# Patient Record
Sex: Female | Born: 1985 | Race: Black or African American | Hispanic: No | Marital: Single | Smoking: Current every day smoker
Health system: Southern US, Community
[De-identification: ages and names within clinical notes are randomized; demographics above are authoritative.]

## PROBLEM LIST (undated history)

## (undated) DIAGNOSIS — G8929 Other chronic pain: Secondary | ICD-10-CM

## (undated) DIAGNOSIS — R519 Headache, unspecified: Secondary | ICD-10-CM

## (undated) DIAGNOSIS — R112 Nausea with vomiting, unspecified: Secondary | ICD-10-CM

## (undated) DIAGNOSIS — I10 Essential (primary) hypertension: Secondary | ICD-10-CM

## (undated) DIAGNOSIS — R109 Unspecified abdominal pain: Secondary | ICD-10-CM

## (undated) DIAGNOSIS — R51 Headache: Secondary | ICD-10-CM

## (undated) HISTORY — PX: OTHER SURGICAL HISTORY: SHX169

---
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
## 2004-11-23 ENCOUNTER — Ambulatory Visit: Payer: Self-pay | Admitting: Family Medicine

## 2005-02-27 ENCOUNTER — Ambulatory Visit: Payer: Self-pay | Admitting: Family Medicine

## 2005-06-01 ENCOUNTER — Emergency Department (HOSPITAL_COMMUNITY): Admission: EM | Admit: 2005-06-01 | Discharge: 2005-06-02 | Payer: Self-pay | Admitting: *Deleted

## 2005-07-09 ENCOUNTER — Ambulatory Visit: Payer: Self-pay | Admitting: Family Medicine

## 2005-09-05 ENCOUNTER — Emergency Department (HOSPITAL_COMMUNITY): Admission: EM | Admit: 2005-09-05 | Discharge: 2005-09-05 | Payer: Self-pay | Admitting: Emergency Medicine

## 2006-01-04 ENCOUNTER — Ambulatory Visit: Payer: Self-pay | Admitting: Family Medicine

## 2006-02-21 ENCOUNTER — Ambulatory Visit: Payer: Self-pay | Admitting: Family Medicine

## 2006-04-07 ENCOUNTER — Emergency Department (HOSPITAL_COMMUNITY): Admission: EM | Admit: 2006-04-07 | Discharge: 2006-04-07 | Payer: Self-pay | Admitting: Emergency Medicine

## 2006-04-08 ENCOUNTER — Ambulatory Visit: Payer: Self-pay | Admitting: Family Medicine

## 2006-04-08 ENCOUNTER — Observation Stay (HOSPITAL_COMMUNITY): Admission: AD | Admit: 2006-04-08 | Discharge: 2006-04-09 | Payer: Self-pay | Admitting: Obstetrics and Gynecology

## 2006-07-10 ENCOUNTER — Ambulatory Visit: Payer: Self-pay | Admitting: Family Medicine

## 2006-07-10 ENCOUNTER — Encounter: Payer: Self-pay | Admitting: Family Medicine

## 2006-07-10 ENCOUNTER — Other Ambulatory Visit: Admission: RE | Admit: 2006-07-10 | Discharge: 2006-07-10 | Payer: Self-pay | Admitting: Family Medicine

## 2007-04-21 ENCOUNTER — Emergency Department (HOSPITAL_COMMUNITY): Admission: EM | Admit: 2007-04-21 | Discharge: 2007-04-22 | Payer: Self-pay | Admitting: Emergency Medicine

## 2007-05-28 ENCOUNTER — Emergency Department (HOSPITAL_COMMUNITY): Admission: EM | Admit: 2007-05-28 | Discharge: 2007-05-28 | Payer: Self-pay | Admitting: Emergency Medicine

## 2007-09-14 ENCOUNTER — Observation Stay (HOSPITAL_COMMUNITY): Admission: EM | Admit: 2007-09-14 | Discharge: 2007-09-15 | Payer: Self-pay | Admitting: Emergency Medicine

## 2008-02-11 ENCOUNTER — Emergency Department (HOSPITAL_COMMUNITY): Admission: EM | Admit: 2008-02-11 | Discharge: 2008-02-11 | Payer: Self-pay | Admitting: Emergency Medicine

## 2008-02-29 ENCOUNTER — Emergency Department (HOSPITAL_COMMUNITY): Admission: EM | Admit: 2008-02-29 | Discharge: 2008-02-29 | Payer: Self-pay | Admitting: Emergency Medicine

## 2008-05-05 ENCOUNTER — Other Ambulatory Visit: Admission: RE | Admit: 2008-05-05 | Discharge: 2008-05-05 | Payer: Self-pay | Admitting: Family Medicine

## 2008-05-05 ENCOUNTER — Encounter: Payer: Self-pay | Admitting: Family Medicine

## 2008-05-05 ENCOUNTER — Ambulatory Visit: Payer: Self-pay | Admitting: Family Medicine

## 2008-05-05 LAB — CONVERTED CEMR LAB: Pap Smear: NORMAL

## 2008-05-06 ENCOUNTER — Encounter: Payer: Self-pay | Admitting: Family Medicine

## 2008-05-06 LAB — CONVERTED CEMR LAB
Chlamydia, DNA Probe: NEGATIVE
Gardnerella vaginalis: NEGATIVE

## 2008-06-28 ENCOUNTER — Emergency Department (HOSPITAL_COMMUNITY): Admission: EM | Admit: 2008-06-28 | Discharge: 2008-06-28 | Payer: Self-pay | Admitting: Emergency Medicine

## 2008-07-07 ENCOUNTER — Ambulatory Visit: Payer: Self-pay | Admitting: Family Medicine

## 2008-07-13 ENCOUNTER — Encounter: Payer: Self-pay | Admitting: Family Medicine

## 2008-07-13 DIAGNOSIS — F17200 Nicotine dependence, unspecified, uncomplicated: Secondary | ICD-10-CM

## 2008-07-13 DIAGNOSIS — Z720 Tobacco use: Secondary | ICD-10-CM | POA: Insufficient documentation

## 2008-07-13 DIAGNOSIS — G43909 Migraine, unspecified, not intractable, without status migrainosus: Secondary | ICD-10-CM

## 2008-07-13 HISTORY — DX: Migraine, unspecified, not intractable, without status migrainosus: G43.909

## 2008-10-13 ENCOUNTER — Ambulatory Visit: Payer: Self-pay | Admitting: Family Medicine

## 2008-10-13 DIAGNOSIS — R5381 Other malaise: Secondary | ICD-10-CM | POA: Insufficient documentation

## 2008-10-13 DIAGNOSIS — R5383 Other fatigue: Secondary | ICD-10-CM

## 2008-11-20 ENCOUNTER — Emergency Department (HOSPITAL_COMMUNITY): Admission: EM | Admit: 2008-11-20 | Discharge: 2008-11-20 | Payer: Self-pay | Admitting: Emergency Medicine

## 2009-02-14 ENCOUNTER — Emergency Department (HOSPITAL_COMMUNITY): Admission: EM | Admit: 2009-02-14 | Discharge: 2009-02-14 | Payer: Self-pay | Admitting: Emergency Medicine

## 2009-02-16 ENCOUNTER — Ambulatory Visit: Payer: Self-pay | Admitting: Family Medicine

## 2009-02-28 ENCOUNTER — Telehealth: Payer: Self-pay | Admitting: Family Medicine

## 2009-04-11 ENCOUNTER — Ambulatory Visit: Payer: Self-pay | Admitting: Family Medicine

## 2009-04-17 ENCOUNTER — Emergency Department (HOSPITAL_COMMUNITY): Admission: EM | Admit: 2009-04-17 | Discharge: 2009-04-17 | Payer: Self-pay | Admitting: Emergency Medicine

## 2009-08-24 ENCOUNTER — Emergency Department (HOSPITAL_COMMUNITY): Admission: EM | Admit: 2009-08-24 | Discharge: 2009-08-24 | Payer: Self-pay | Admitting: Emergency Medicine

## 2009-11-08 ENCOUNTER — Telehealth: Payer: Self-pay | Admitting: Family Medicine

## 2009-11-14 ENCOUNTER — Ambulatory Visit: Payer: Self-pay | Admitting: Family Medicine

## 2009-11-14 LAB — CONVERTED CEMR LAB
BUN: 8 mg/dL (ref 6–23)
Basophils Absolute: 0 10*3/uL (ref 0.0–0.1)
Beta hcg, urine, semiquantitative: NEGATIVE
CO2: 23 meq/L (ref 19–32)
Calcium: 9.5 mg/dL (ref 8.4–10.5)
Chloride: 106 meq/L (ref 96–112)
Creatinine, Ser: 0.68 mg/dL (ref 0.40–1.20)
Eosinophils Relative: 2 % (ref 0–5)
Glucose, Bld: 81 mg/dL (ref 70–99)
HCT: 39.8 % (ref 36.0–46.0)
Hemoglobin: 13.9 g/dL (ref 12.0–15.0)
Lymphocytes Relative: 54 % — ABNORMAL HIGH (ref 12–46)
Lymphs Abs: 2.4 10*3/uL (ref 0.7–4.0)
Monocytes Absolute: 0.4 10*3/uL (ref 0.1–1.0)
Monocytes Relative: 9 % (ref 3–12)
Neutrophils Relative %: 35 % — ABNORMAL LOW (ref 43–77)
Potassium: 4.3 meq/L (ref 3.5–5.3)
RBC: 4.15 M/uL (ref 3.87–5.11)
RDW: 13.5 % (ref 11.5–15.5)
Sodium: 140 meq/L (ref 135–145)
TSH: 1.558 microintl units/mL (ref 0.350–4.500)
VLDL: 19 mg/dL (ref 0–40)
WBC: 4.5 10*3/uL (ref 4.0–10.5)

## 2009-12-30 ENCOUNTER — Ambulatory Visit: Payer: Self-pay | Admitting: Family Medicine

## 2009-12-30 ENCOUNTER — Other Ambulatory Visit: Admission: RE | Admit: 2009-12-30 | Discharge: 2009-12-30 | Payer: Self-pay | Admitting: Family Medicine

## 2009-12-31 ENCOUNTER — Encounter: Payer: Self-pay | Admitting: Family Medicine

## 2009-12-31 DIAGNOSIS — N760 Acute vaginitis: Secondary | ICD-10-CM | POA: Insufficient documentation

## 2010-01-02 ENCOUNTER — Encounter: Payer: Self-pay | Admitting: Family Medicine

## 2010-01-05 LAB — CONVERTED CEMR LAB: Gardnerella vaginalis: POSITIVE — AB

## 2010-02-14 ENCOUNTER — Emergency Department (HOSPITAL_COMMUNITY): Admission: EM | Admit: 2010-02-14 | Discharge: 2010-02-14 | Payer: Self-pay | Admitting: Emergency Medicine

## 2010-03-15 ENCOUNTER — Encounter (INDEPENDENT_AMBULATORY_CARE_PROVIDER_SITE_OTHER): Payer: Self-pay

## 2010-03-15 ENCOUNTER — Ambulatory Visit: Payer: Self-pay | Admitting: Internal Medicine

## 2010-03-19 DIAGNOSIS — R1013 Epigastric pain: Secondary | ICD-10-CM

## 2010-03-19 DIAGNOSIS — R112 Nausea with vomiting, unspecified: Secondary | ICD-10-CM

## 2010-03-23 ENCOUNTER — Encounter: Payer: Self-pay | Admitting: Internal Medicine

## 2010-03-28 ENCOUNTER — Ambulatory Visit: Payer: Self-pay | Admitting: Internal Medicine

## 2010-03-28 ENCOUNTER — Ambulatory Visit (HOSPITAL_COMMUNITY): Admission: RE | Admit: 2010-03-28 | Discharge: 2010-03-28 | Payer: Self-pay | Admitting: Internal Medicine

## 2010-03-29 ENCOUNTER — Encounter: Payer: Self-pay | Admitting: Internal Medicine

## 2010-04-07 ENCOUNTER — Emergency Department (HOSPITAL_COMMUNITY): Admission: EM | Admit: 2010-04-07 | Discharge: 2010-04-07 | Payer: Self-pay | Admitting: Emergency Medicine

## 2010-05-21 ENCOUNTER — Emergency Department (HOSPITAL_COMMUNITY): Admission: EM | Admit: 2010-05-21 | Discharge: 2010-05-21 | Payer: Self-pay | Admitting: Emergency Medicine

## 2010-05-30 ENCOUNTER — Ambulatory Visit: Payer: Self-pay | Admitting: Family Medicine

## 2010-05-30 DIAGNOSIS — N3000 Acute cystitis without hematuria: Secondary | ICD-10-CM | POA: Insufficient documentation

## 2010-05-30 DIAGNOSIS — R634 Abnormal weight loss: Secondary | ICD-10-CM

## 2010-05-31 ENCOUNTER — Encounter: Payer: Self-pay | Admitting: Family Medicine

## 2010-06-01 LAB — CONVERTED CEMR LAB
Basophils Absolute: 0 10*3/uL (ref 0.0–0.1)
Lymphocytes Relative: 51 % — ABNORMAL HIGH (ref 12–46)
MCV: 98.1 fL (ref 78.0–100.0)
Neutrophils Relative %: 40 % — ABNORMAL LOW (ref 43–77)
RBC: 4.11 M/uL (ref 3.87–5.11)
RDW: 14 % (ref 11.5–15.5)
Vit D, 25-Hydroxy: 20 ng/mL — ABNORMAL LOW (ref 30–89)
WBC: 4.3 10*3/uL (ref 4.0–10.5)

## 2010-10-13 ENCOUNTER — Ambulatory Visit: Payer: Self-pay | Admitting: Family Medicine

## 2010-10-13 DIAGNOSIS — N912 Amenorrhea, unspecified: Secondary | ICD-10-CM

## 2010-10-13 LAB — CONVERTED CEMR LAB: Beta hcg, urine, semiquantitative: POSITIVE

## 2010-10-17 ENCOUNTER — Encounter: Payer: Self-pay | Admitting: Family Medicine

## 2010-10-18 ENCOUNTER — Emergency Department (HOSPITAL_COMMUNITY): Admission: EM | Admit: 2010-10-18 | Discharge: 2010-10-18 | Payer: Self-pay | Admitting: Emergency Medicine

## 2010-10-19 ENCOUNTER — Emergency Department (HOSPITAL_COMMUNITY): Admission: EM | Admit: 2010-10-19 | Discharge: 2010-10-19 | Payer: Self-pay | Admitting: Emergency Medicine

## 2010-10-23 ENCOUNTER — Telehealth (INDEPENDENT_AMBULATORY_CARE_PROVIDER_SITE_OTHER): Payer: Self-pay | Admitting: *Deleted

## 2010-11-01 ENCOUNTER — Emergency Department (HOSPITAL_COMMUNITY): Admission: EM | Admit: 2010-11-01 | Discharge: 2010-11-02 | Payer: Self-pay | Admitting: Emergency Medicine

## 2010-11-03 ENCOUNTER — Other Ambulatory Visit: Admission: RE | Admit: 2010-11-03 | Discharge: 2010-11-03 | Payer: Self-pay | Admitting: Obstetrics & Gynecology

## 2010-11-03 ENCOUNTER — Observation Stay (HOSPITAL_COMMUNITY): Admission: EM | Admit: 2010-11-03 | Discharge: 2010-11-04 | Payer: Self-pay | Admitting: Emergency Medicine

## 2010-11-09 ENCOUNTER — Emergency Department (HOSPITAL_COMMUNITY): Admission: EM | Admit: 2010-11-09 | Discharge: 2010-11-09 | Payer: Self-pay | Admitting: Emergency Medicine

## 2010-11-10 ENCOUNTER — Emergency Department (HOSPITAL_COMMUNITY): Admission: EM | Admit: 2010-11-10 | Discharge: 2010-11-10 | Payer: Self-pay | Admitting: Emergency Medicine

## 2010-11-11 ENCOUNTER — Emergency Department (HOSPITAL_COMMUNITY): Admission: EM | Admit: 2010-11-11 | Discharge: 2010-11-11 | Payer: Self-pay | Admitting: Emergency Medicine

## 2010-11-12 ENCOUNTER — Observation Stay (HOSPITAL_COMMUNITY): Admission: EM | Admit: 2010-11-12 | Discharge: 2010-11-13 | Payer: Self-pay | Admitting: Emergency Medicine

## 2011-01-14 ENCOUNTER — Encounter: Payer: Self-pay | Admitting: Family Medicine

## 2011-01-23 NOTE — Miscellaneous (Signed)
Summary: ct from aph  Clinical Lists Changes CT Pelvis With Contrast - STATUS: Final  .          By: Lyna Poser  ,        Perform Date: 20Sep08 23:38  Ordered By: Sherry Ruffing,          Ordered Date: 20Sep08 21:12  Facility: [facility]                               Department: CT  Service Report Text  [facility] Accession Number: [id number]     Addendum Begins   Addendum: Given the suspicion for appendicitis, we were asked to   repeat the   study after delay to allow contrast to opacify right lower quadrant   bowel loops.   On repeat images, the appendix is still not definitively visualized.   Contrast   now opacifies terminal ileum and colon. No definite inflammatory   process in the   right lower quadrant.    IMPRESSION:    Despite repeating the study, the appendix cannot be definitively   visualized. No   definite inflammatory process in the right lower quadrant. Recommend   close   clinical followup if there is high suspicion for appendicitis.   Addendum Ends   Clinical Data: Vomiting, abdominal pain    Technique:  Multidetector CT imaging of the abdomen and pelvis was   performed   following the standard protocol during bolus administration of   intravenous   contrast.    Contrast:  100 cc Omnipaque 300    Comparison:  None    ABDOMEN CT WITH CONTRAST    Findings:  Solid organs are unremarkable. Gallbladder unremarkable.   No free   fluid, free air, or adenopathy. Lung bases are clear.    There are mildly prominent small bowel loops in the midabdomen.   Distal small   bowel is decompressed. These dilated loops may be related to mild   ileus although   early small bowel obstruction cannot be excluded.    IMPRESSION    Mildly dilated midabdominal small bowel loops. Given the distal small   bowel   loops are decompressed, I favor this may be related to ileus although   early   small bowel obstruction cannot be excluded.    PELVIS CT WITH CONTRAST   Findings:  Small bowel in the pelvis is decompressed. Contrast has   not yet made   to the terminal ileum and cecum. I cannot definitively visualize the   appendix.   No definite right lower quadrant inflammatory process. Small amount   of free   fluid is present in the pelvis.    IMPRESSION    Appendix not definitively visualized but no inflammatory process in   the right   lower quadrant. No acute findings in the pelvis.    Read By:  Charlett Nose,  M.D.   Released By:  Charlett Nose,  M.D.  Additional Information  HL7 RESULT STATUS : F  External IF Update Timestamp : 2007-09-14:02:45:50.000000    L-CMP (Comprehensive Metabolic Pnl-CMET) - STATUS: Final                                            Perform Date: 22Feb11 12:02  Ordered By: Hoy Finlay MD ,  PROVIDER         Ordered Date: 22Feb11 11:53                                       Last Updated Date: 22Feb11 12:42  Facility: [facility]                               Department: GENL  Accession #: [id number]                    USN:       [id number]  Findings  Result Name                              Result     Abnl   Normal Range     Units      Perf. Loc.  Sodium (NA)                                 141               135-145          mEq/L  Potassium (K)                                3.6               3.5-5.1          mEq/L  Chloride                                        108               96-112           mEq/L  CO2                                              24                19-32            mEq/L  Glucose                                        107        h      70-99            mg/dL  BUN                                               6                 6-23             mg/dL  Creatinine  0.66              0.4-1.2          mg/dL  GFR, Est Non African American      >60               >60              mL/min  GFR, Est African American             >60               >60               mL/min    Oversized comment, see footnote  1  Bilirubin, Total                                 0.6               0.3-1.2          mg/dL  Alkaline Phosphatase                     63                39-117           U/L  SGOT (AST)                                  32                0-37             U/L  SGPT (ALT)                                   27                0-35             U/L  Total  Protein                                 7.6               6.0-8.3          g/dL  Albumin-Blood                               4.3               3.5-5.2          g/dL  Calcium                                         9.2               8.4-10.5         mg/dL  Footnotes  1. The eGFR has been calculated     using the MDRD equation.     This calculation has not been     validated in all clinical     situations.     eGFR's persistently     <  60 mL/min signify     possible Chronic Kidney Disease.  Additional Information  HL7 RESULT STATUS : F  External IF Update Timestamp : 2010-02-14:12:38:00.000000    L-CBC-with Differential - STATUS: Final                               Perform Date: 22Feb11 12:02  Ordered By: Hoy Finlay MD , PROVIDER         Ordered Date: 22Feb11 11:53                                       Last Updated Date: 22Feb11 12:23  Facility: [facility]                               Department: GENL  Accession #: [id number]                   USN:       [id number]  Findings  Result Name                              Result     Abnl   Normal Range     Units      Perf. Loc.  WBC                                            8.7               4.0-10.5         K/uL  RBC                                             4.23              3.87-5.11        MIL/uL  Hemoglobin (HGB)                         14.2              12.0-15.0        g/dL  Hematocrit (HCT)                           41.9              36.0-46.0        %  MCV                                             99.0              78.0-100.0        fL  MCHC  33.9              30.0-36.0        g/dL  RDW                                             14.1              11.5-15.5        %  Platelet Count (PLT)                        255               150-400          K/uL  Neutrophils, %                                  83         h      43-77            %  Lymphocytes, %                               12                12-46            %  Monocytes, %                                    5                 3-12             %  Eosinophils, %                                   0                 0-5              %  Basophils, %                                      0                 0-1              %  Neutrophils, Absolute                        7.2               1.7-7.7          K/uL  Lymphocytes, Absolute                    1.1               0.7-4.0          K/uL  Monocytes, Absolute  0.4               0.1-1.0          K/uL  Eosinophils, Absolute                       0.0               0.0-0.7          K/uL  Basophils, Absolute                          0.0               0.0-0.1          K/uL  Additional Information  HL7 RESULT STATUS : F  External IF Update Timestamp : 2010-02-14:12:20:00.000000

## 2011-01-23 NOTE — Letter (Signed)
Summary: EGD ORDER  EGD ORDER   Imported By: Ave Filter 03/15/2010 11:45:34  _____________________________________________________________________  External Attachment:    Type:   Image     Comment:   External Document

## 2011-01-23 NOTE — Letter (Signed)
Summary: NEED PHONE #  NEED PHONE #   Imported By: Lind Guest 10/17/2010 13:39:16  _____________________________________________________________________  External Attachment:    Type:   Image     Comment:   External Document  Appended Document: NEED PHONE # She has an appointment on Nov. 1, 2011 with [facility].

## 2011-01-23 NOTE — Assessment & Plan Note (Signed)
Summary: ov   Vital Signs:  Patient profile:   25 year old female Menstrual status:  regular Height:      61.5 inches Weight:      96.08 pounds BMI:     17.92 O2 Sat:      98 % on Room air Pulse rate:   65 / minute Resp:     16 per minute BP sitting:   100 / 50  (left arm)  Vitals Entered By: Adella Hare LPN (October 13, 2010 8:50 AM) CC: morning nausea vomitting x 3 days- no period this month Is Patient Diabetic? No Pain Assessment Patient in pain? no        Primary Provider:  simpson  CC:  morning nausea vomitting x 3 days- no period this month.  History of Present Illness: Pt presents today with morning nausea and vomiting x 3 days.  Her last menstrual period was sometime the beginning of Sept.  She is not using any birth control .  No vag bleeding or spotting.  Current Medications (verified): 1)  None  Allergies (verified): No Known Drug Allergies  Past History:  Past medical history reviewed for relevance to current acute and chronic problems.  Past Medical History: Reviewed history from 07/13/2008 and no changes required. NICOTINE ADDICTION (ICD-305.1) MIGRAINE HEADACHE (ICD-346.90)  Review of Systems GI:  Complains of nausea and vomiting; denies abdominal pain and change in bowel habits. GU:  Denies abnormal vaginal bleeding.  Physical Exam  General:  Well-developed,well-nourished,in no acute distress; alert,appropriate and cooperative throughout examination Head:  Normocephalic and atraumatic without obvious abnormalities. No apparent alopecia or balding. Lungs:  Normal respiratory effort, chest expands symmetrically. Lungs are clear to auscultation, no crackles or wheezes. Heart:  Normal rate and regular rhythm. S1 and S2 normal without gallop, murmur, click, rub or other extra sounds. Abdomen:  soft, non-tender, and no masses.   Psych:  Cognition and judgment appear intact. Alert and cooperative with normal attention span and concentration. No  apparent delusions, illusions, hallucinations   Impression & Recommendations:  Problem # 1:  AMENORRHEA, SECONDARY (ICD-626.0) Assessment New  The following medications were removed from the medication list:    Implanon 68 Mg Impl (Etonogestrel) ..... Uad  Orders: Urine Pregnancy Test  (44010)  Problem # 2:  PREGNANCY (ICD-V22.2) Assessment: New  Discussed importance of starting prenatal vit.  Discussed with pt that she needs to try to quit smoking.  That smoking during pregnancy increases the risk of premature delivery and complications for the baby. Pt asked if she could drink beer during her pregnancy.  Advised pt no alcohol during prenancy and that alcohol can cause FAS.  Orders: Obstetric Referral (Obstetric)  Complete Medication List: 1)  Ultra Tabs Tabs (Prenatal vit-dss-fe cbn-fa) .... Take one daily  Patient Instructions: 1)  Congratulations on your pregnancy. 2)  I have referred you to an OB/GYN dr to care for you during your pregnancy. 3)  I have prescribed a pregnancy vitamin for you.  You need to take this once a day. 4)  Try to quit smoking and no alcohol during pregnancy. 5)  You may try saltine crackers and ginger ale for nausea. Prescriptions: ULTRA TABS  TABS (PRENATAL VIT-DSS-FE CBN-FA) take one daily  #30 x 11   Entered and Authorized by:   Esperanza Sheets PA   Signed by:   Esperanza Sheets PA on 10/13/2010   Method used:   Electronically to        [facility] (retail)  [address]       [place], [place]  [address]       Ph: [phone]       Fax: [phone]   RxID:   [id number]    Orders Added: 1)  Urine Pregnancy Test  [81025] 2)  Obstetric Referral [Obstetric] 3)  Est. Patient Level III [[address]213]    Laboratory Results   Urine Tests  Date/Time Received: October 13, 2010 8:52 AM  Date/Time Reported: October 13, 2010 8:52 AM     Urine HCG: positive

## 2011-01-23 NOTE — Letter (Signed)
Summary: REFERRAL FROM ER  REFERRAL FROM ER   Imported By: Diana Eves 03/23/2010 14:17:19  _____________________________________________________________________  External Attachment:    Type:   Image     Comment:   External Document

## 2011-01-23 NOTE — Assessment & Plan Note (Signed)
Summary: office visit   Vital Signs:  Patient profile:   25 year old female Menstrual status:  regular Height:      61.5 inches Weight:      90.50 pounds BMI:     16.88 O2 Sat:      98 % on Room air Pulse rate:   69 / minute Resp:     16 per minute BP sitting:   96 / 62  (left arm) Cuff size:   small  Vitals Entered By: Adella Hare LPN (May 30, 1609 8:25 AM)  O2 Flow:  Room air CC: follow-up visit Is Patient Diabetic? No Pain Assessment Patient in pain? no      Comments requesting a med  to help her gain weight   Primary Care Provider:  Reneta Niehaus  CC:  follow-up visit.  History of Present Illness: pt has been drinking alcohol approx twice per week was in the ed states she has not drank since then and she wants to stop, also very concerned about a poor apetite ,wants a stimulant.  Denies recent fever or chills. Denies sinus pressure, nasal congestion , ear pain or sore throat. Denies chest congestion, or cough productive of sputum. Denies chest pain, palpitations, PND, orthopnea or leg swelling. Denies current  abdominal pain, nausea, vomitting, diarrhea or constipation.She does however note that when she drinks excessively she experiences epigastric pain, she denies hematemesius or melena. Denies change in bowel movements or bloody stool.  Denies  joint pain, swelling, or reduced mobility. Denies headaches, vertigo, seizures. Denies depression, anxiety or insomnia. Denies  rash, lesions, or itch.     Preventive Screening-Counseling & Management  Alcohol-Tobacco     Smoking Cessation Counseling: yes  Current Medications (verified): 1)  Implanon 68 Mg Impl (Etonogestrel) .... Uad  Allergies (verified): No Known Drug Allergies  Review of Systems      See HPI Eyes:  Denies blurring and discharge. GU:  Complains of dysuria and urinary frequency; reports being told she had a uTI last week at the ed ,did not fill script and still has symptoms. Heme:  Denies  abnormal bruising and bleeding. Allergy:  Denies hives or rash and itching eyes.  Physical Exam  General:  Well-developedunderweight appearing,in no acute distress; alert,appropriate and cooperative throughout examination HEENT: No facial asymmetry,  EOMI, No sinus tenderness, TM's Clear, oropharynx  pink and moist.   Chest: Clear to auscultation bilaterally.  CVS: S1, S2, No murmurs, No S3.   Abd: Soft, Nontender.  MS: Adequate ROM spine, hips, shoulders and knees.  Ext: No edema.   CNS: CN 2-12 intact, power tone and sensation normal throughout.   Skin: Intact, no visible lesions or rashes.  Psych: Good eye contact, normal affect.  Memory intact, not anxious or depressed appearing.    Impression & Recommendations:  Problem # 1:  WEIGHT LOSS (RUE-454.09) Assessment Deteriorated  Orders: T-CBC w/Diff (81191-47829) T-HIV Antibody  (Reflex) [id number]) T-Vitamin D (25-Hydroxy) (84696-29528) prescriptio given for megace  Problem # 2:  ACUTE CYSTITIS (ICD-595.0) Assessment: Comment Only  Her updated medication list for this problem includes:    Cipro 500 Mg Tabs (Ciprofloxacin hcl) .Marland Kitchen... Take 1 tablet by mouth two times a day  Orders: T-Culture, Urine (41324-40102)  Encouraged to push clear liquids, get enough rest, and take acetaminophen as needed. To be seen in 10 days if no improvement, sooner if worse.  Problem # 3:  EPIGASTRIC PAIN (ICD-789.06) Assessment: Comment Only directly associated with alcohol ingestion, pt encouraGED TO  QUIT  Problem # 4:  NICOTINE ADDICTION (ICD-305.1) Assessment: Unchanged  Encouraged smoking cessation and discussed different methods for smoking cessation.   Complete Medication List: 1)  Implanon 68 Mg Impl (Etonogestrel) .... Uad 2)  Megace Oral 40 Mg/ml Susp (Megestrol acetate) .... One half teaspoon twice daily 3)  Cipro 500 Mg Tabs (Ciprofloxacin hcl) .... Take 1 tablet by mouth two times a day 4)  Vitamin D (ergocalciferol)  50000 Unit Caps (Ergocalciferol) .... One capsule once weekly  Patient Instructions: 1)  F/u in 7 weeks 2)  Tobacco is very bad for your health and your loved ones! You Should stop smoking!. 3)  Stop Smoking Tips: Choose a Quit date. Cut down before the Quit date. decide what you will do as a substitute when you feel the urge to smoke(gum,toothpick,exercise). 4)  It is not healthy  for men to drink more than 2-3 drinks per day or for women to drink more than 1-2 drinks per day. 5)  pls ensure you drink at least 6 to 8 glasses of water daily. 6)  Med is sent in for apetite and urinary tract infection. Prescriptions: VITAMIN D (ERGOCALCIFEROL) 50000 UNIT CAPS (ERGOCALCIFEROL) one capsule once weekly  #4 x 5   Entered and Authorized by:   Syliva Overman MD   Signed by:   Syliva Overman MD on 06/01/2010   Method used:   Electronically to        [facility] (retail)       [address] [place], [place]  [address]       Ph: [phone]       Fax: [phone]   RxID:   [id number] CIPRO 500 MG TABS (CIPROFLOXACIN HCL) Take 1 tablet by mouth two times a day  #6 x 0   Entered and Authorized by:   Syliva Overman MD   Signed by:   Syliva Overman MD on 05/30/2010   Method used:   Electronically to        [facility] (retail)       [address]       [place], [place]  [address]       Ph: [phone]       Fax: [phone]   RxID:   [id number] MEGACE ORAL 40 MG/ML SUSP (MEGESTROL ACETATE) one half teaspoon twice daily  #150 cc x 1   Entered and Authorized by:   Syliva Overman MD   Signed by:   Syliva Overman MD on 05/30/2010   Method used:   Electronically to        [facility] (retail)       [address]       [place], [place]  [address]       Ph: [phone]       Fax: [phone]   RxID:   [id number]

## 2011-01-23 NOTE — Assessment & Plan Note (Signed)
Summary: office visit   Vital Signs:  Patient profile:   25 year old female Menstrual status:  regular LMP:     12/07/2009 Height:      61.5 inches Weight:      91.50 pounds BMI:     17.07 O2 Sat:      98 % Pulse rate:   73 / minute Pulse rhythm:   regular Resp:     16 per minute BP sitting:   98 / 60 Cuff size:   regular  Vitals Entered By: Everitt Amber (December 30, 2009 8:58 AM) CC: Follow up chronic problems, vomiting and stomach hurting some since Tuesday. Hasn't thrown up since wednesday but still having problems with stomach pain  Vision Screening:Left eye w/o correction: 20 / 25 Right Eye w/o correction: 20 / 20 Both eyes w/o correction:  20/ 20  Color vision testing: normal      Vision Entered By: Everitt Amber (December 30, 2009 9:10 AM) LMP (date): 12/07/2009     Menstrual Status regular Enter LMP: 12/07/2009 Last PAP Result Normal   Primary Care Provider:  Syliva Overman MD  CC:  Follow up chronic problems and vomiting and stomach hurting some since Tuesday. Hasn't thrown up since wednesday but still having problems with stomach pain.  History of Present Illness: Reports  that she has been  doing well. Denies recent fever or chills. Denies sinus pressure, nasal congestion , ear pain or sore throat. Denies chest congestion, or cough productive of sputum. Denies chest pain, palpitations, PND, orthopnea or leg swelling. Reports abdominal pain,did have  nausea and vomitting which has resolved. Denies bloody stool  Denies change in bowel movements or bloody stool. Denies dysuria , frequency, incontinence or hesitancy.Reports malodoros vaginal d/c Denies  joint pain, swelling, or reduced mobility. Denies headaches, vertigo, seizures. Denies depression, anxiety or insomnia. Denies  rash, lesions, or itch. Pt reports decrease alcohol and micotine use.     Preventive Screening-Counseling & Management  Alcohol-Tobacco     Smoking Cessation Counseling:  yes  Current Medications (verified): 1)  Implanon 68 Mg Impl (Etonogestrel) .... Uad  Allergies (verified): No Known Drug Allergies  Review of Systems      See HPI Eyes:  Denies blurring and discharge. GI:  Complains of abdominal pain; denies constipation, diarrhea, indigestion, nausea, and vomiting; burning central abdominal pain. Neuro:  Complains of headaches; denies seizures and sensation of room spinning; migraine headache last night, first in months. Heme:  Denies abnormal bruising and bleeding. Allergy:  Denies hives or rash and sneezing.  Physical Exam  General:  Well-developedunderweight appearing,in no acute distress; alert,appropriate and cooperative throughout examination Head:  Normocephalic and atraumatic without obvious abnormalities. No apparent alopecia or balding. Eyes:  No corneal or conjunctival inflammation noted. EOMI. Perrla. Funduscopic exam benign, without hemorrhages, exudates or papilledema. Vision grossly normal. Ears:  External ear exam shows no significant lesions or deformities.  Otoscopic examination reveals clear canals, tympanic membranes are intact bilaterally without bulging, retraction, inflammation or discharge. Hearing is grossly normal bilaterally. Nose:  External nasal examination shows no deformity or inflammation. Nasal mucosa are pink and moist without lesions or exudates. Mouth:  pharynx pink and moist and fair dentition.   Neck:  No deformities, masses, or tenderness noted. Chest Wall:  No deformities, masses, or tenderness noted. Breasts:  No mass, nodules, thickening, tenderness, bulging, retraction, inflamation, nipple discharge or skin changes noted.   Lungs:  Normal respiratory effort, chest expands symmetrically. Lungs are clear to  auscultation, no crackles or wheezes. Heart:  Normal rate and regular rhythm. S1 and S2 normal without gallop, murmur, click, rub or other extra sounds. Abdomen:  Bowel sounds positive,abdomen soft and  non-tender without masses, organomegaly or hernias noted. Genitalia:  Normal introitus for age, no external lesions, positive vaginal discharge, mucosa pink and moist, no vaginal or cervical lesions, no vaginal atrophy, no friaility or hemorrhage, normal uterus size and position, no adnexal masses or tenderness Msk:  No deformity or scoliosis noted of thoracic or lumbar spine.   Pulses:  R and L carotid,radial,femoral,dorsalis pedis and posterior tibial pulses are full and equal bilaterally Extremities:  No clubbing, cyanosis, edema, or deformity noted with normal full range of motion of all joints.   Neurologic:  No cranial nerve deficits noted. Station and gait are normal. Plantar reflexes are down-going bilaterally. DTRs are symmetrical throughout. Sensory, motor and coordinative functions appear intact. Skin:  Intact without suspicious lesions or rashes Cervical Nodes:  No lymphadenopathy noted Axillary Nodes:  No palpable lymphadenopathy Inguinal Nodes:  No significant adenopathy Psych:  Cognition and judgment appear intact. Alert and cooperative with normal attention span and concentration. No apparent delusions, illusions, hallucinations   Impression & Recommendations:  Problem # 1:  NICOTINE ADDICTION (ICD-305.1) Assessment Improved  Encouraged smoking cessation and discussed different methods for smoking cessation.   Problem # 2:  PHYSICAL EXAMINATION (ICD-V70.0) Assessment: Comment Only pap sent. Counselled re safe sex practices. Pt to start oCP which was precribed 3 months ago, she never collected it  Problem # 3:  VULVOVAGINITIS (ICD-616.10) Assessment: Comment Only  Orders: T-Wet Prep by Molecular Probe [phone]) T-Chlamydia & GC Probe, Genital ([id number])  Problem # 4:  MIGRAINE HEADACHE (ICD-346.90) Assessment: Deteriorated  Her updated medication list for this problem includes:    Ibuprofen 600 Mg Tabs (Ibuprofen) ..... One at headache onset rept after  6 to 8 hrs  if needed if headache persits  Complete Medication List: 1)  Implanon 68 Mg Impl (Etonogestrel) .... Uad 2)  Ibuprofen 600 Mg Tabs (Ibuprofen) .... One at headache onset rept after 6 to 8 hrs  if needed if headache persits  Patient Instructions: 1)  F/U in 5 .5 months. 2)  Tobacco is very bad for your health and your loved ones! You Should stop smoking!. 3)  Stop Smoking Tips: Choose a Quit date. Cut down before the Quit date. decide what you will do as a substitute when you feel the urge to smoke(gum,toothpick,exercise). 4)  Meds are sent in for  birth control and also for migraines if you have them Prescriptions: IBUPROFEN 600 MG TABS (IBUPROFEN) one at headache onset rept after 6 to 8 hrs  if needed if headache persits  #30 x 0   Entered and Authorized by:   Syliva Overman MD   Signed by:   Syliva Overman MD on 12/30/2009   Method used:   Electronically to        [facility] (retail)       [address] [place], [place]  [address]       Ph: [phone]       Fax: [phone]   RxID:   [id number]

## 2011-01-23 NOTE — Assessment & Plan Note (Signed)
Summary: gastritis,ref from er, glu   Visit Type:  Initial Consult Primary Care Provider:  simpson  Chief Complaint:  gastritis and follow up from er.  History of Present Illness: 26 year old African American lady with nearly daily alcohol consumption and multiple ER visits for abdominal pain nausea and vomiting referred by Dr. Effie Shy in the ER for further evaluation of these symptoms. She states she has had these symptoms for a couple of years but they have worsened. She states she has been to the emergency room many times with abdominal pain nausea and vomiting, with those episodes associated with "drinking too much".  She apparently has had imaging studies and labs done in the emergency room, none of which I have reviewed. She is not familiar with the term "pancreatitis". She did not have any melena rectal bleeding constipation diarrhea. She has lost 4 pounds recently she tells me she's never weight more than 100 pounds. She recently weight 98 pounds but is 94 pounds today in our office. She does have occasional reflux symptoms. She takes multiple Aleve and/or ibuprofen daily for chronic headaches. She denies any history of peptic ulcer disease. She denies any GI illnesses or surgeries previously. She's only spent the night in the hospital  once and that was for an abortion.  There is no family history of chronic GI illness including neoplasia.  Current Problems (verified): 1)  Vulvovaginitis  (ICD-616.10) 2)  Physical Examination  (ICD-V70.0) 3)  Screening For Condition  () 4)  Fatigue  (ICD-780.79) 5)  Nicotine Addiction  (ICD-305.1) 6)  Migraine Headache  (ICD-346.90)  Current Medications (verified): 1)  Implanon 68 Mg Impl (Etonogestrel) .... Uad  Allergies (verified): No Known Drug Allergies  Past History:  Past Medical History: Last updated: 07/17/08 NICOTINE ADDICTION (ICD-305.1) MIGRAINE HEADACHE (ICD-346.90)  Past Surgical History: Last updated: 11/14/2009  abortion in  2007  Family History: Last updated: 07-17-08 NO CHILDREN MOTHER  DECEASED  MVA FATHER DECEASED  UNKNOWN RAISED BY GRANDMOTHER TWO  SISTERS  LIVING  HEALTHY NO BROTHERS  Social History: Last updated: 10/13/2008 UNEMPLOYED Single Current Smoker  2/day Alcohol use-no Drug use-no  Risk Factors: Smoking Status: current (11/14/2009) Packs/Day: <0.25 (11/14/2009)  Vital Signs:  Patient profile:   25 year old female Menstrual status:  regular Height:      61.5 inches Weight:      94 pounds BMI:     17.54 Temp:     98.0 degrees F oral Pulse rate:   76 / minute BP sitting:   98 / 70  (left arm) Cuff size:   regular  Vitals Entered By: Hendricks Limes LPN (March 15, 2010 11:18 AM)  Physical Exam  General:  pleasant small frame Lahey resting comfortably Eyes:  no scleral icterus. Conjunctiva are pink Lungs:  clear to auscultation Heart:  regular rate and rhythm without murmur gallop rub Abdomen:  nondistended positive bowel sounds entirely soft and nontender without appreciable mass or organomegaly  Impression & Recommendations: Impression:  Pleasant 25 year old lady with frequent alcohol use with intermittent epigastric pain nausea and vomiting. She's had multiple emergency department visits. In addition, she uses multiple NSAIDs on a regular basis. Although I do not have all of the test results for evaluation at this time, it appears she had a negative CT scan. There is no history of pancreatitis. I spoke this point in time, the differential would include occult gallbladder disease, peptic ulcer disease and EtOH/NSAID gastropathy or she does have some symptoms consistent with gastroesophageal reflux disease.  Recommendations: Have asked Ms. Dzikowski to significantly curtail the use of alcohol. I recommended she have a diagnostic EGD in the near future. Risks, benefits, limitations, alternatives and imponderables I have been reviewed her questions were answered. She is agreeable  with further recommendations in the very near future. Hopefully, we will get additional data back from her ED evaluations in the near future.  Appended Document: Orders Update    Clinical Lists Changes  Problems: Added new problem of EPIGASTRIC PAIN (ICD-789.06) Added new problem of NAUSEA AND VOMITING (ICD-787.01) Added new problem of LONG-TERM USE NON-STEROIDAL ANTI-INFLAMMATORIES (ICD-V58.64) Orders: Added new Service order of Consultation Level IV [phone]) - Signed

## 2011-01-23 NOTE — Progress Notes (Signed)
Summary: Phone numbers  Phone Note Call from Patient   Summary of Call: Patient called in and gave [place] these two numbers for alternates, she states her phone doesn't always have minutes but we can call these two numbers when we need them, she gave verbal permission to call .Marti Sleigh boyfriends number [phone] Jamesetta Orleans,  Initial call taken by: Curtis Sites,  October 23, 2010 8:08 AM

## 2011-01-23 NOTE — Letter (Signed)
Summary: Patient Notice, Endo Biopsy Results  [facility]  [address]   [place], [place] [address]   Phone: [phone]  Fax: [phone]       March 29, 2010   [facility] [address] [place], [place]  [address] Feb 20, 1986    Dear Ms. Silvester,  I am pleased to inform you that the biopsies taken during your recent endoscopic examination did not show any evidence of cancer upon pathologic examination.  Additional information/recommendations:  No further action is needed at this time.  Please follow-up with your primary care physician for your other healthcare needs.  As discussed with you, abstinence from alcohol is the best approach in managing your condition.    Please call us if you are having persistent problems or have questions about your condition that have not been fully answered at this time.  Sincerely,    R. Roetta Sessions MD, FACP [facility] Ph: [phone]   Fax: [phone]

## 2011-02-07 ENCOUNTER — Emergency Department (HOSPITAL_COMMUNITY)
Admission: EM | Admit: 2011-02-07 | Discharge: 2011-02-07 | Disposition: A | Discharge status: Home / Self Care | Payer: Medicaid Other | Attending: Emergency Medicine | Admitting: Emergency Medicine

## 2011-02-07 DIAGNOSIS — O2690 Pregnancy related conditions, unspecified, unspecified trimester: Secondary | ICD-10-CM | POA: Insufficient documentation

## 2011-02-07 DIAGNOSIS — E860 Dehydration: Secondary | ICD-10-CM | POA: Insufficient documentation

## 2011-02-07 LAB — URINE MICROSCOPIC-ADD ON

## 2011-02-07 LAB — URINALYSIS, ROUTINE W REFLEX MICROSCOPIC
Hgb urine dipstick: NEGATIVE
Ketones, ur: >80 mg/dL — AB
Protein, ur: 30 mg/dL — AB
Specific Gravity, Urine: >1.03 — ABNORMAL HIGH (ref 1.005–1.030)
Urobilinogen, UA: 0.2 mg/dL (ref 0.0–1.0)
pH: 5.5 (ref 5.0–8.0)

## 2011-02-09 LAB — URINE CULTURE: Colony Count: 25000

## 2011-02-15 NOTE — H&P (Signed)
  Jaclyn, Howe             ACCOUNT NO.:  [id number]  MEDICAL RECORD NO.:  [id number]           PATIENT TYPE:  LOCATION:                                 FACILITY:  PHYSICIAN:  Tilda Burrow, M.D. DATE OF BIRTH:  1986-01-24  DATE OF ADMISSION:  11/04/2010 DATE OF DISCHARGE:  LH                             HISTORY & PHYSICAL   ADMISSION DIAGNOSIS:  Hyperemesis gravidarum.  HISTORY OF PRESENT ILLNESS:  A 25 year old female with chief complaint of nausea and vomiting, presents at 4:20 p.m. complaining of progressively worsening intermittent episodes of nausea and vomiting. The patient is well on the [facility].  She presents to the emergency room where evaluation including knows her to have mild abdominal tenderness with no rebound or guarding, bowel sounds present with vomiting and retching obviously present.  LABORATORY DATA:  Hemoglobin 12, hematocrit 36.  Urinalysis performed. White count is 9300.  PAST MEDICAL HISTORY:  Positive peptic ulcer disease, pregnant, and history of ulcers.  SURGICAL HISTORY:  Negative.  SOCIAL HISTORY:  No known drug use.  Occasional drinker, lives at home, smokes two pack of cigarettes a day.  She is pregnant, gravida 2, para 0- 1-1-0.  HOME MEDICINES: 1. Phenergan 25 mg every 4 hours. 2. Zofran orally 4 mg every 8 hours.  ASSESSMENT:  Pregnancy, first trimester (LMP in September 2011) admitted for hyperemesis gravidarum.  PLAN:  IV fluid hydration, IV Zofran, antiemetics as needed, rehydration until the patient tolerating p.o. liquids and hydrated.     Tilda Burrow, M.D.    JVF/MEDQ  D:  02/05/2011  T:  02/06/2011  Job:  [id number]  Electronically Signed by Christin Bach M.D. on 02/15/2011 06:31:38 PM

## 2011-02-22 NOTE — H&P (Signed)
  Jaclyn Howe, Jaclyn Howe             ACCOUNT NO.:  [id number]  MEDICAL RECORD NO.:  [id number]          PATIENT TYPE:  OBV  LOCATION:  A334                          FACILITY:  [facility]  PHYSICIAN:  Tilda Burrow, M.D. DATE OF BIRTH:  [birth year]  DATE OF ADMISSION:  11/12/2010 DATE OF DISCHARGE:  11/21/2011LH                             HISTORY & PHYSICAL   ADMISSION DIAGNOSES:  Pregnancy, 8 weeks' gestation, hyperemesis gravidarum, mild dehydration less than 5% with ketonuria.  The patient is a 25 year old female with chief complaints of vomiting, seen in the emergency room on November 12, 2010, for the same problem as the previous 3 days.  She struggles mainly in the morning with problems beginning at 6 a.m.  The patient presented to the emergency room again for evaluation where she was seen by Dr. Vanetta Mulders.  She was given Zofran for antiemetic.  She is a gravida 2, para 0-0-1-0, at 8 weeks' gestation by previous ultrasound.  She is found to have urine specific gravity 1.025, pH 6.0, no evidence of infection, urine ketones greater than 80 with no nitrates or esterase present.  Urine ketones, acetone was negative.  Potassium of 3.3.  BUN was 3.  She was admitted basically because of recurrence and persistence of her complaints and multiple ER visits.  HOSPITAL COURSE:  The patient was admitted with IV fluid hydration with 1000 mL initially of isotonic fluid followed by D5 lactated Ringer's at 125 an hour.  She was given Phenergan and Reglan as well as Zofran ODT. Urinalysis was ordered the following morning, but it was not part of the record.  She was nonetheless tolerating diet and felt much better and was well hydrated having begun to void routinely.  She was therefore discharged home to resume her previous prenatal records and medications which included Zofran ODT 4 mg every 8 hours, Reglan 10 mg p.o. twice daily, and prenatal vitamins and will follow up at least in 1 week  at [facility].     Tilda Burrow, M.D.     JVF/MEDQ  D:  02/17/2011  T:  02/18/2011  Job:  [id number]  Electronically Signed by Christin Bach M.D. on 02/22/2011 08:43:21 PM

## 2011-03-06 LAB — URINALYSIS, ROUTINE W REFLEX MICROSCOPIC
Glucose, UA: NEGATIVE mg/dL
Glucose, UA: NEGATIVE mg/dL
Hgb urine dipstick: NEGATIVE
Ketones, ur: 40 mg/dL — AB
Ketones, ur: >80 mg/dL — AB
Leukocytes, UA: NEGATIVE
Nitrite: NEGATIVE
Nitrite: NEGATIVE
Nitrite: NEGATIVE
Protein, ur: 30 mg/dL — AB
Specific Gravity, Urine: 1.02 (ref 1.005–1.030)
Specific Gravity, Urine: 1.025 (ref 1.005–1.030)
Specific Gravity, Urine: 1.025 (ref 1.005–1.030)
Specific Gravity, Urine: >1.03 — ABNORMAL HIGH (ref 1.005–1.030)
Urobilinogen, UA: 1 mg/dL (ref 0.0–1.0)
pH: 6 (ref 5.0–8.0)
pH: 6.5 (ref 5.0–8.0)
pH: 6.5 (ref 5.0–8.0)

## 2011-03-06 LAB — COMPREHENSIVE METABOLIC PANEL
ALT: 16 U/L (ref 0–35)
ALT: 23 U/L (ref 0–35)
Albumin: 3.2 g/dL — ABNORMAL LOW (ref 3.5–5.2)
Alkaline Phosphatase: 33 U/L — ABNORMAL LOW (ref 39–117)
Calcium: 7.8 mg/dL — ABNORMAL LOW (ref 8.4–10.5)
Calcium: 9.1 mg/dL (ref 8.4–10.5)
Creatinine, Ser: 0.52 mg/dL (ref 0.4–1.2)
GFR calc Af Amer: >60 mL/min (ref >60)
Glucose, Bld: 99 mg/dL (ref 70–99)
Potassium: 3.4 mEq/L — ABNORMAL LOW (ref 3.5–5.1)
Sodium: 130 mEq/L — ABNORMAL LOW (ref 135–145)
Sodium: 135 mEq/L (ref 135–145)
Total Protein: 5.7 g/dL — ABNORMAL LOW (ref 6.0–8.3)
Total Protein: 7.5 g/dL (ref 6.0–8.3)

## 2011-03-06 LAB — DIFFERENTIAL
Basophils Absolute: 0 10*3/uL (ref 0.0–0.1)
Basophils Relative: 0 % (ref 0–1)
Eosinophils Absolute: 0 10*3/uL (ref 0.0–0.7)
Eosinophils Absolute: 0 10*3/uL (ref 0.0–0.7)
Eosinophils Relative: 0 % (ref 0–5)
Lymphocytes Relative: 11 % — ABNORMAL LOW (ref 12–46)
Lymphocytes Relative: 7 % — ABNORMAL LOW (ref 12–46)
Lymphs Abs: 1.1 10*3/uL (ref 0.7–4.0)
Monocytes Absolute: 0.3 10*3/uL (ref 0.1–1.0)
Monocytes Relative: 3 % (ref 3–12)
Monocytes Relative: 6 % (ref 3–12)
Neutro Abs: 7.6 10*3/uL (ref 1.7–7.7)
Neutro Abs: 9.2 10*3/uL — ABNORMAL HIGH (ref 1.7–7.7)
Neutrophils Relative %: 82 % — ABNORMAL HIGH (ref 43–77)

## 2011-03-06 LAB — CBC
HCT: 36.6 % (ref 36.0–46.0)
HCT: 38.5 % (ref 36.0–46.0)
MCHC: 34.5 g/dL (ref 30.0–36.0)
Platelets: 314 10*3/uL (ref 150–400)
RDW: 13 % (ref 11.5–15.5)
RDW: 13.7 % (ref 11.5–15.5)
WBC: 10.3 10*3/uL (ref 4.0–10.5)

## 2011-03-06 LAB — BASIC METABOLIC PANEL
BUN: 3 mg/dL — ABNORMAL LOW (ref 6–23)
Chloride: 102 mEq/L (ref 96–112)
Creatinine, Ser: 0.66 mg/dL (ref 0.4–1.2)
GFR calc non Af Amer: >60 mL/min (ref >60)
GFR calc non Af Amer: >60 mL/min (ref >60)
Glucose, Bld: 80 mg/dL (ref 70–99)
Potassium: 3.3 mEq/L — ABNORMAL LOW (ref 3.5–5.1)
Sodium: 129 mEq/L — ABNORMAL LOW (ref 135–145)

## 2011-03-06 LAB — KETONES, QUALITATIVE: Acetone, Bld: NEGATIVE

## 2011-03-06 LAB — POCT PREGNANCY, URINE: Preg Test, Ur: POSITIVE

## 2011-03-06 LAB — HCG, QUANTITATIVE, PREGNANCY: hCG, Beta Chain, Quant, S: 97271 m[IU]/mL — ABNORMAL HIGH (ref <5)

## 2011-03-06 LAB — URINE CULTURE
Colony Count: NO GROWTH
Culture  Setup Time: 201111120545
Culture: NO GROWTH

## 2011-03-06 LAB — URINE MICROSCOPIC-ADD ON

## 2011-03-07 LAB — COMPREHENSIVE METABOLIC PANEL
ALT: 19 U/L (ref 0–35)
AST: 25 U/L (ref 0–37)
Alkaline Phosphatase: 39 U/L (ref 39–117)
CO2: 21 mEq/L (ref 19–32)
Calcium: 8.9 mg/dL (ref 8.4–10.5)
GFR calc Af Amer: >60 mL/min (ref >60)
Potassium: 3.1 mEq/L — ABNORMAL LOW (ref 3.5–5.1)
Sodium: 137 mEq/L (ref 135–145)
Total Protein: 6.8 g/dL (ref 6.0–8.3)

## 2011-03-07 LAB — DIFFERENTIAL
Basophils Absolute: 0 10*3/uL (ref 0.0–0.1)
Basophils Relative: 0 % (ref 0–1)
Eosinophils Absolute: 0 10*3/uL (ref 0.0–0.7)
Eosinophils Relative: 0 % (ref 0–5)
Eosinophils Relative: 0 % (ref 0–5)
Lymphocytes Relative: 7 % — ABNORMAL LOW (ref 12–46)
Lymphs Abs: 0.8 10*3/uL (ref 0.7–4.0)
Lymphs Abs: 1.1 10*3/uL (ref 0.7–4.0)
Monocytes Relative: 5 % (ref 3–12)
Neutro Abs: 9.6 10*3/uL — ABNORMAL HIGH (ref 1.7–7.7)
Neutrophils Relative %: 91 % — ABNORMAL HIGH (ref 43–77)

## 2011-03-07 LAB — CBC
HCT: 33.5 % — ABNORMAL LOW (ref 36.0–46.0)
Hemoglobin: 11.6 g/dL — ABNORMAL LOW (ref 12.0–15.0)
MCHC: 34.7 g/dL (ref 30.0–36.0)
MCV: 97.9 fL (ref 78.0–100.0)
Platelets: 257 10*3/uL (ref 150–400)
RBC: 4.09 MIL/uL (ref 3.87–5.11)
RDW: 12.9 % (ref 11.5–15.5)
RDW: 13.3 % (ref 11.5–15.5)
WBC: 10.6 10*3/uL — ABNORMAL HIGH (ref 4.0–10.5)
WBC: 10.9 10*3/uL — ABNORMAL HIGH (ref 4.0–10.5)

## 2011-03-07 LAB — URINALYSIS, ROUTINE W REFLEX MICROSCOPIC
Bilirubin Urine: NEGATIVE
Bilirubin Urine: NEGATIVE
Glucose, UA: NEGATIVE mg/dL
Hgb urine dipstick: NEGATIVE
Ketones, ur: >80 mg/dL — AB
Leukocytes, UA: NEGATIVE
Nitrite: NEGATIVE
Specific Gravity, Urine: 1.015 (ref 1.005–1.030)
Urobilinogen, UA: 0.2 mg/dL (ref 0.0–1.0)
pH: 6 (ref 5.0–8.0)
pH: 6.5 (ref 5.0–8.0)

## 2011-03-07 LAB — BASIC METABOLIC PANEL
BUN: 7 mg/dL (ref 6–23)
Calcium: 9.7 mg/dL (ref 8.4–10.5)
Creatinine, Ser: 0.67 mg/dL (ref 0.4–1.2)
GFR calc Af Amer: >60 mL/min (ref >60)
GFR calc non Af Amer: >60 mL/min (ref >60)

## 2011-03-07 LAB — URINE MICROSCOPIC-ADD ON

## 2011-03-07 LAB — HCG, QUANTITATIVE, PREGNANCY: hCG, Beta Chain, Quant, S: 58120 m[IU]/mL — ABNORMAL HIGH (ref <5)

## 2011-03-12 LAB — CBC
MCHC: 34.4 g/dL (ref 30.0–36.0)
RBC: 3.96 MIL/uL (ref 3.87–5.11)
WBC: 8.2 10*3/uL (ref 4.0–10.5)

## 2011-03-12 LAB — COMPREHENSIVE METABOLIC PANEL
ALT: 21 U/L (ref 0–35)
AST: 30 U/L (ref 0–37)
CO2: 25 mEq/L (ref 19–32)
Calcium: 8.7 mg/dL (ref 8.4–10.5)
Chloride: 112 mEq/L (ref 96–112)
GFR calc Af Amer: >60 mL/min (ref >60)
GFR calc non Af Amer: >60 mL/min (ref >60)
Potassium: 4.1 mEq/L (ref 3.5–5.1)
Sodium: 143 mEq/L (ref 135–145)

## 2011-03-12 LAB — URINALYSIS, ROUTINE W REFLEX MICROSCOPIC
Glucose, UA: NEGATIVE mg/dL
Hgb urine dipstick: NEGATIVE
Ketones, ur: NEGATIVE mg/dL
Protein, ur: 30 mg/dL — AB
pH: 7 (ref 5.0–8.0)

## 2011-03-12 LAB — DIFFERENTIAL
Eosinophils Absolute: 0 10*3/uL (ref 0.0–0.7)
Eosinophils Relative: 0 % (ref 0–5)
Lymphs Abs: 0.6 10*3/uL — ABNORMAL LOW (ref 0.7–4.0)

## 2011-03-12 LAB — URINE MICROSCOPIC-ADD ON

## 2011-03-13 LAB — URINALYSIS, ROUTINE W REFLEX MICROSCOPIC
Glucose, UA: NEGATIVE mg/dL
Hgb urine dipstick: NEGATIVE
Ketones, ur: 40 mg/dL — AB
Leukocytes, UA: NEGATIVE
Protein, ur: 100 mg/dL — AB
Urobilinogen, UA: 0.2 mg/dL (ref 0.0–1.0)

## 2011-03-13 LAB — COMPREHENSIVE METABOLIC PANEL
ALT: 27 U/L (ref 0–35)
AST: 30 U/L (ref 0–37)
Alkaline Phosphatase: 63 U/L (ref 39–117)
CO2: 26 mEq/L (ref 19–32)
Chloride: 107 mEq/L (ref 96–112)
GFR calc Af Amer: >60 mL/min (ref >60)
GFR calc non Af Amer: >60 mL/min (ref >60)
Glucose, Bld: 111 mg/dL — ABNORMAL HIGH (ref 70–99)
Sodium: 141 mEq/L (ref 135–145)
Total Bilirubin: 1 mg/dL (ref 0.3–1.2)

## 2011-03-13 LAB — DIFFERENTIAL
Eosinophils Relative: 0 % (ref 0–5)
Neutrophils Relative %: 91 % — ABNORMAL HIGH (ref 43–77)

## 2011-03-13 LAB — URINE MICROSCOPIC-ADD ON

## 2011-03-13 LAB — CBC
Hemoglobin: 15 g/dL (ref 12.0–15.0)
MCHC: 35.3 g/dL (ref 30.0–36.0)
RBC: 4.44 MIL/uL (ref 3.87–5.11)
WBC: 11.8 10*3/uL — ABNORMAL HIGH (ref 4.0–10.5)

## 2011-03-14 LAB — COMPREHENSIVE METABOLIC PANEL
ALT: 27 U/L (ref 0–35)
Albumin: 4.3 g/dL (ref 3.5–5.2)
Alkaline Phosphatase: 63 U/L (ref 39–117)
BUN: 6 mg/dL (ref 6–23)
Chloride: 108 mEq/L (ref 96–112)
Potassium: 3.6 mEq/L (ref 3.5–5.1)
Sodium: 141 mEq/L (ref 135–145)
Total Bilirubin: 0.6 mg/dL (ref 0.3–1.2)

## 2011-03-14 LAB — URINALYSIS, ROUTINE W REFLEX MICROSCOPIC
Glucose, UA: NEGATIVE mg/dL
Protein, ur: NEGATIVE mg/dL
pH: 8.5 — ABNORMAL HIGH (ref 5.0–8.0)

## 2011-03-14 LAB — CBC
HCT: 41.9 % (ref 36.0–46.0)
Hemoglobin: 14.2 g/dL (ref 12.0–15.0)
Platelets: 255 10*3/uL (ref 150–400)
WBC: 8.7 10*3/uL (ref 4.0–10.5)

## 2011-03-14 LAB — DIFFERENTIAL
Basophils Absolute: 0 10*3/uL (ref 0.0–0.1)
Basophils Relative: 0 % (ref 0–1)
Eosinophils Absolute: 0 10*3/uL (ref 0.0–0.7)
Eosinophils Relative: 0 % (ref 0–5)
Monocytes Absolute: 0.4 10*3/uL (ref 0.1–1.0)
Neutro Abs: 7.2 10*3/uL (ref 1.7–7.7)

## 2011-03-30 LAB — URINALYSIS, ROUTINE W REFLEX MICROSCOPIC
Bilirubin Urine: NEGATIVE
Hgb urine dipstick: NEGATIVE
Nitrite: NEGATIVE
Specific Gravity, Urine: 1.025 (ref 1.005–1.030)
Urobilinogen, UA: 1 mg/dL (ref 0.0–1.0)
pH: 7 (ref 5.0–8.0)

## 2011-03-30 LAB — COMPREHENSIVE METABOLIC PANEL
ALT: 21 U/L (ref 0–35)
AST: 33 U/L (ref 0–37)
Albumin: 4.2 g/dL (ref 3.5–5.2)
Calcium: 8.7 mg/dL (ref 8.4–10.5)
Creatinine, Ser: 0.59 mg/dL (ref 0.4–1.2)
GFR calc Af Amer: >60 mL/min (ref >60)
Sodium: 143 mEq/L (ref 135–145)
Total Protein: 7 g/dL (ref 6.0–8.3)

## 2011-03-30 LAB — CBC
MCHC: 34.9 g/dL (ref 30.0–36.0)
MCV: 99.3 fL (ref 78.0–100.0)
Platelets: 241 10*3/uL (ref 150–400)
RBC: 3.95 MIL/uL (ref 3.87–5.11)
RDW: 13.5 % (ref 11.5–15.5)

## 2011-03-30 LAB — DIFFERENTIAL
Eosinophils Absolute: 0 10*3/uL (ref 0.0–0.7)
Eosinophils Relative: 0 % (ref 0–5)
Lymphocytes Relative: 19 % (ref 12–46)
Lymphs Abs: 1.4 10*3/uL (ref 0.7–4.0)
Monocytes Relative: 5 % (ref 3–12)

## 2011-03-30 LAB — LIPASE, BLOOD: Lipase: 13 U/L (ref 11–59)

## 2011-03-30 LAB — URINE MICROSCOPIC-ADD ON

## 2011-03-30 LAB — RAPID URINE DRUG SCREEN, HOSP PERFORMED
Amphetamines: NOT DETECTED
Barbiturates: NOT DETECTED
Opiates: NOT DETECTED

## 2011-04-04 LAB — PREGNANCY, URINE: Preg Test, Ur: NEGATIVE

## 2011-04-04 LAB — URINALYSIS, ROUTINE W REFLEX MICROSCOPIC
Ketones, ur: 40 mg/dL — AB
Leukocytes, UA: NEGATIVE
Nitrite: NEGATIVE
pH: 5.5 (ref 5.0–8.0)

## 2011-04-04 LAB — URINE MICROSCOPIC-ADD ON

## 2011-04-23 ENCOUNTER — Inpatient Hospital Stay (HOSPITAL_COMMUNITY)
Admission: AD | Admit: 2011-04-23 | Discharge: 2011-04-23 | Disposition: A | Discharge status: Home / Self Care | Payer: Medicaid Other | Source: Ambulatory Visit | Attending: Obstetrics & Gynecology | Admitting: Obstetrics & Gynecology

## 2011-04-23 ENCOUNTER — Emergency Department (HOSPITAL_COMMUNITY)
Admission: EM | Admit: 2011-04-23 | Discharge: 2011-04-23 | Disposition: A | Discharge status: Nursing Home (Includes ICF) | Payer: Medicaid Other | Source: Home / Self Care | Attending: Emergency Medicine | Admitting: Emergency Medicine

## 2011-04-23 DIAGNOSIS — O4700 False labor before 37 completed weeks of gestation, unspecified trimester: Secondary | ICD-10-CM | POA: Insufficient documentation

## 2011-04-23 DIAGNOSIS — O212 Late vomiting of pregnancy: Secondary | ICD-10-CM | POA: Insufficient documentation

## 2011-04-23 LAB — URINALYSIS, ROUTINE W REFLEX MICROSCOPIC
Bilirubin Urine: NEGATIVE
Bilirubin Urine: NEGATIVE
Hgb urine dipstick: NEGATIVE
Ketones, ur: 15 mg/dL — AB
Ketones, ur: 40 mg/dL — AB
Nitrite: NEGATIVE
Protein, ur: NEGATIVE mg/dL
Specific Gravity, Urine: 1.015 (ref 1.005–1.030)
Urobilinogen, UA: 1 mg/dL (ref 0.0–1.0)
Urobilinogen, UA: 0.2 mg/dL (ref 0.0–1.0)

## 2011-04-23 LAB — RAPID URINE DRUG SCREEN, HOSP PERFORMED
Amphetamines: NOT DETECTED
Cocaine: NOT DETECTED
Opiates: NOT DETECTED
Tetrahydrocannabinol: POSITIVE — AB

## 2011-05-08 NOTE — H&P (Signed)
Jaclyn Howe, Jaclyn Howe             ACCOUNT NO.:  [id number]   MEDICAL RECORD NO.:  [id number]          PATIENT TYPE:  INP   LOCATION:  A322                          FACILITY:  [facility]   PHYSICIAN:  Skeet Latch, DO    DATE OF BIRTH:  04-Oct-1986   DATE OF ADMISSION:  09/13/2007  DATE OF DISCHARGE:  LH                              HISTORY & PHYSICAL   CHIEF COMPLAINT:  Abdominal pain.   PRIMARY CARE PHYSICIAN:  Jaclyn Howe. Jaclyn Howe, M.D.   HISTORY OF PRESENT ILLNESS:  This is a 25 year old African American  female with a one-day history of abdominal pain.  The patient states  that her abdominal pain started yesterday with some associated nausea  and vomiting.  The patient states she has had multiple episodes of  vomiting and she states the pain is sharp and located in her lower  abdomen bilaterally.  The patient does state that she has had previous  episodes of his pain and was told that she had ulcers and states that  this pain is very similar to the pain she felt before.  The patient  states at that time she was told to take some medication which she  believes is an anti-ulcer medication but states that she lost it  sometime ago and has not been on that medication for some time.  The  patient denies any other significant medical history.   PAST MEDICAL HISTORY:  Positive for ulcers.   PAST SURGICAL HISTORY:  Unremarkable.   CURRENT MEDICATIONS:  None.   SOCIAL HISTORY:  States she takes two to three cigarettes daily for  approximately five years.  Denies any illicit drug use.  Admits to  drinking beer and alcohol on weekends.  Patient does admit that she  drank heavily approximately two nights ago.   ALLERGIES:  No drug allergies.   REVIEW OF SYSTEMS:  CONSTITUTIONAL:  Denies any weight loss or weight  gain.  HEENT: Denies any headache, eyes, ears, nose and throat problems  or swallowing issues.  CARDIOVASCULAR:  No chest pain or palpitations.  RESPIRATORY:  No cough,  wheezing or dyspnea.  GASTROINTESTINAL:  Positive for some vomiting and abdominal pain.  No diarrhea or bloody  stools.  GENITOURINARY:  No dysuria, urgency, frequency.  MUSCULOSKELETAL:  No back pain arthralgias, myalgias.  SKIN:  No rashes  or lesions.  NEUROLOGIC:  No headaches, lightheadedness or dizziness.  All other systems are negative. She is a gravida 3, para 0.   PHYSICAL EXAMINATION:  VITAL SIGNS:  Temperature 99. pulse 91,  respirations 18, blood pressure 108/58, sating 98% on room air.  GENERAL:  She is well-hydrated, well-nourished, looks stated age,  pleasant and cooperative.  HEENT: Head is atraumatic, normocephalic.  PERRLA.  EOMI.  No scleral  icterus.  Oral mucosa is moist.  NECK:  Soft, supple, nontender, nondistended.  CARDIOVASCULAR:  Regular rate and rhythm.  No murmurs, rubs or gallops.  RESPIRATORY:  Lungs clear to auscultation bilaterally.  No rales,  rhonchi or wheezing.  ABDOMEN:  Soft.  Slight tenderness in her umbilical region.  No rigidity  or guarding  noted.  Slight bowel sounds.  EXTREMITIES:  No clubbing, cyanosis or edema.  NEUROLOGICALLY:  Current nerves II-XII are grossly intact.  Patient  alert and oriented x3.   LABORATORY DATA:  Sodium 136, potassium 3.9, chloride 107, CO2 24,  glucose 115, BUN 9, creatinine 0.93.  White count 9, hemoglobin 13.9,  hematocrit 41, platelets 291.  Of note, the patient did have a wet prep  performed on September 13, 2007, which was positive for some clue cells,  rare WBCs.  Her UA was unremarkable.  Urine pregnancy test was negative.  Lipase 11.   Chest X-Ray:  No active disease.  CT of her pelvis with contrast.  Appendix not definitely visualized but know about her process in the  right lower quadrant.  No acute findings in the pelvis.  Pelvic CT  showed a small bowel and the pelvis is decompressed, contrast has not  made it to the terminal ileum and cecum could not visualize the  appendix.  Her CT was repeated  given suspicion for appendicitis.  On  repeat images, the appendix is still not definitely visualized.   ASSESSMENT:  1. Abdominal pain.  2. History of peptic ulcer disease.   PLAN:  The patient was admitted to the general medical floor with IV  fluid and IV pain medication.  Surgery was consulted secondary to  suspicion for possible appendicitis and the patient's age.  The patient  was also placed on DVT and GI prophylaxis as well as IV antiemetics.  The patient is currently stable and surgery is in progress of seeing the  patient at this time.  The patient will be followed up closely.      Skeet Latch, DO  Electronically Signed     SM/MEDQ  D:  09/14/2007  T:  09/15/2007  Job:  [id number]   cc:   Jaclyn Howe. Jaclyn Howe, M.D.  Fax: [phone]

## 2011-05-08 NOTE — Consult Note (Signed)
Jaclyn Howe             ACCOUNT NO.:  [id number]   MEDICAL RECORD NO.:  [id number]          PATIENT TYPE:  INP   LOCATION:  A322                          FACILITY:  [facility]   PHYSICIAN:  Tilford Pillar, MD      DATE OF BIRTH:  07/20/1986   DATE OF CONSULTATION:  09/14/2007  DATE OF DISCHARGE:                                 CONSULTATION   REASON FOR CONSULTATION:  Abdominal pain.   HISTORY OF PRESENT ILLNESS:  The patient is a 25 year old African  American female who had presented to [facility] Emergency  Department with approximately 1-day history of abdominal pain.  She  stated that this had come on with a rather insidious onset and described  it as colicky pain that intermittently improved throughout the day.  She  did describe this as a crampy pain, mostly in the epigastrium but also  throughout the right and left abdomen.  She denied any bloody emesis.  She did state slight nausea secondarily to the pain, but no acute onset  of nausea prior.  She did not notice any changes with her bowel  movements.  She denied any melena or hematochezia.  She denied any  changes with urination.  No hematuria, no dysuria.  She also stated that  she had had similar symptoms several years in the past.  This had been  worked up and she had stated that they were suspicious for peptic ulcer  disease.  She was recommended to take oral medications for this,  however, has not been taking any medications recently.  Additionally she  has denied any fever or chills.  She has denied any unusual travel or  any sick contacts.  The patient does admit a family history of  gallbladder disease with a aunt who has recently had a gallbladder  surgery.  She denies any history of jaundice or changes in urine or  stool coloration.   PAST MEDICAL HISTORY:  Other than for peptic ulcer disease, none.   MEDICATIONS:  No current medications.   ALLERGIES:  No known drug allergies.   SOCIAL HISTORY:   Positive for tobacco use, occasional alcohol use.  The  patient denies any recreational drug use.   PHYSICAL EXAMINATION:  VITAL SIGNS:  Temperature 99.9, heart rate 88,  respirations 18, blood pressure1118/62, 100% O2 saturation on room air.  GENERAL:  Upon entering the patient's room she was sleeping in the  supine position quite soundly.  It took quite a bit to arouse her;  however, upon being awakened, she was in no acute distress.  She was  alert and oriented, responding to questioning appropriately.  HEENT:  Pupils were equal, reactive, and round.  Extraocular movements  were intact.  There was no known scleral icterus.  Conjunctivae were  clear.  PULMONARY:  Clear to auscultation in both lung fields.  Unlabored  respirations.  CARDIOVASCULAR:  Regular rate and rhythm.  No murmurs, gallops, or rubs  were appreciated.  ABDOMEN:  Positive bowel sounds.  Abdomen was soft and flat,  nondistended.  Mild right upper quadrant epigastric tenderness.  No  peritoneal signs were appreciated.  No hernias or masses were palpated.  EXTREMITIES:  Warm and dry.   LABORATORY DATA:  Laboratory and radiographic evidence:  CBC:  White  blood cell count 15.5, hemoglobin 14.9, hematocrit 43, platelets 308.  Chem-7:  Sodium 142, potassium 4.2, chloride 109, bicarb 23, BUN 14,  creatinine 0.81, glucose 80.  Liver panel was within normal limits.  Calcium was 9.  Urinalysis was negative except some scant red blood  cells.   CT of abdomen and pelvis demonstrated no free air, no free peritoneal  fluid.  There was no stranding of the mesentery that connects with  not  visualized, but no inflammatory changes were noted around the cecum.   ASSESSMENT/PLAN:  Abdominal pain.  Plan:  Based on the patient's  symptomatology, it was suspicious that this may be of biliary etiology;  however, with her past history of peptic ulcer disease, she was started  on a proton pump inhibitor and H. pylori serology will be  obtained on  her next blood draw.  Additionally to work up the possible biliary  etiology, with the  positive family history and symptoms,  suggestive of  possible biliary disease.  We will obtain a right upper quadrant  ultrasound in the morning to further evaluate her gallbladder.  At this  point, she will be continued on fluid resuscitation and kept on an  n.p.o. status for possible surgical intervention should her gallbladder  become a clear etiology of her symptomatology.  At this point her  symptoms have improved since her admission and we will continue to  closely observe her at this point.  Her questions and concerns were  addressed in regards to the current plans of action, and we will  continue to follow her closely while she is in the hospital.  I  appreciate the opportunity to take part in her care.      Tilford Pillar, MD  Electronically Signed     BZ/MEDQ  D:  09/14/2007  T:  09/14/2007  Job:  [id number]   cc:   [facility]   Stacie Glaze, MD  [address] [place]  [place] [address]

## 2011-05-09 ENCOUNTER — Inpatient Hospital Stay (HOSPITAL_COMMUNITY)
Admission: AD | Admit: 2011-05-09 | Discharge: 2011-05-10 | Disposition: A | Discharge status: Home / Self Care | Payer: Medicaid Other | Source: Ambulatory Visit | Attending: Obstetrics & Gynecology | Admitting: Obstetrics & Gynecology

## 2011-05-09 DIAGNOSIS — O4700 False labor before 37 completed weeks of gestation, unspecified trimester: Secondary | ICD-10-CM | POA: Insufficient documentation

## 2011-05-11 NOTE — Discharge Summary (Signed)
NAMESTANA, BAYON             ACCOUNT NO.:  [id number]   MEDICAL RECORD NO.:  [id number]          PATIENT TYPE:  OBV   LOCATION:  A418                          FACILITY:  [facility]   PHYSICIAN:  Tilda Burrow, M.D. DATE OF BIRTH:  1986/06/27   DATE OF ADMISSION:  04/08/2006  DATE OF DISCHARGE:  04/17/2007LH                                 DISCHARGE SUMMARY   ADMISSION DIAGNOSES:  Pregnancy [redacted] weeks gestation with hyperemesis  gravidarum.   DISCHARGE DIAGNOSES:  Pregnancy [redacted] weeks gestation with hyperemesis  gravidarum, improved.   DISCHARGE MEDICATIONS:  1.  Reglan 10 mg q.a.c., dispense #15 tablets.  2.  Phenergan suppositories, 25 mg one per rectum every 6 hours p.r.n.      nausea.  3.  Transderm Scope patches every 3 days behind the ear.   FOLLOW UP:  Follow up at [facility] as soon as possible and  then two weeks in our office.   HOSPITAL COURSE:  This 25 year old female at six weeks gestation was  referred to our office yesterday and admitted for nausea, vomiting and  complication of pregnancy. Patient was observed overnight and received  intravenous fluids.  She was admitted with hemoglobin 14, hematocrit 41,  Rubella immune to present, RPR nonreactive, with rubella immunity pending.  She was hydrated overnight, feeling better.  Patient requested p.o. intake  this A.M., has gone to the bathroom to void and is being discharged home.  Her plans are to seek elective termination of pregnancy and she has  information regarding available facilities.  Follow up offered in two weeks.  Had a discussion with family regarding future contraceptions options with  IUD encouraged.      Tilda Burrow, M.D.  Electronically Signed     JVF/MEDQ  D:  04/09/2006  T:  04/09/2006  Job:  [id number]

## 2011-05-11 NOTE — H&P (Signed)
Jaclyn Howe, Jaclyn Howe             ACCOUNT NO.:  [id number]   MEDICAL RECORD NO.:  [id number]          PATIENT TYPE:  OIB   LOCATION:  A418                          FACILITY:  [facility]   PHYSICIAN:  Tilda Burrow, M.D. DATE OF BIRTH:  1986-01-08   DATE OF ADMISSION:  04/08/2006  DATE OF DISCHARGE:  LH                                HISTORY & PHYSICAL   OBSERVATION ADMISSION:   REASON FOR CONSULTATION:  Pregnancy at approximately 5 weeks' gestation with  ketonuria, nausea, vomiting and dehydration.   HISTORY OF PRESENT ILLNESS:  Jaclyn Howe is a 25 year old gravida 3, abortus 3,  who is in today with profuse vomiting, unable to retain any p.o.'s at all.   MEDICAL HISTORY:  Negative.   SURGICAL HISTORY:  Positive for D&C x2.   FAMILY HISTORY:  Negative.   PHYSICAL EXAMINATION TODAY:  Weight is 89 pounds, blood pressure 118/68.  She has 2+ ketones and 2+ protein in her urine.  She has a pan with her,  which has a large amount of green liquid in the container.   IMPRESSION:  Early pregnancy with nausea, vomiting and dehydration.   PLAN:  I am going to admit with hydration, antiemetics, and assess her  electrolyte balance.      Zerita Boers, Lanier Clam      Tilda Burrow, M.D.  Electronically Signed    DL/MEDQ  D:  04/54/0981  T:  04/08/2006  Job:  [id number]

## 2011-05-24 ENCOUNTER — Inpatient Hospital Stay (HOSPITAL_COMMUNITY)
Admission: AD | Admit: 2011-05-24 | Discharge: 2011-05-27 | DRG: 766 | Disposition: A | Discharge status: Home / Self Care | Payer: Medicaid Other | Source: Other Acute Inpatient Hospital | Attending: Obstetrics & Gynecology | Admitting: Obstetrics & Gynecology

## 2011-05-24 ENCOUNTER — Emergency Department (HOSPITAL_COMMUNITY)
Admission: EM | Admit: 2011-05-24 | Discharge: 2011-05-24 | Disposition: A | Discharge status: Still In Hospital | Payer: Medicaid Other | Source: Home / Self Care | Attending: Emergency Medicine | Admitting: Emergency Medicine

## 2011-05-24 ENCOUNTER — Other Ambulatory Visit: Payer: Self-pay | Admitting: Obstetrics and Gynecology

## 2011-05-24 DIAGNOSIS — O324XX0 Maternal care for high head at term, not applicable or unspecified: Secondary | ICD-10-CM

## 2011-05-24 LAB — CBC
MCH: 30.7 pg (ref 26.0–34.0)
MCHC: 33.1 g/dL (ref 30.0–36.0)
MCV: 92.7 fL (ref 78.0–100.0)
Platelets: 243 10*3/uL (ref 150–400)
RDW: 13.4 % (ref 11.5–15.5)

## 2011-05-25 LAB — CBC
Hemoglobin: 9.6 g/dL — ABNORMAL LOW (ref 12.0–15.0)
MCH: 30.3 pg (ref 26.0–34.0)
MCHC: 33 g/dL (ref 30.0–36.0)
Platelets: 229 10*3/uL (ref 150–400)

## 2011-05-26 LAB — RH IMMUNE GLOB WKUP(>/=20WKS)(NOT WOMEN'S HOSP): Unit division: 0

## 2011-06-11 NOTE — Discharge Summary (Signed)
  Jaclyn Howe, Jaclyn Howe             ACCOUNT NO.:  [id number]  MEDICAL RECORD NO.:  [id number]           PATIENT TYPE:  I  LOCATION:  9318                          FACILITY:  [facility]  PHYSICIAN:  Lesly Dukes, M.D. DATE OF BIRTH:  January 16, 1986  DATE OF ADMISSION:  05/24/2011 DATE OF DISCHARGE:  05/27/2011                              DISCHARGE SUMMARY   ADMITTING DIAGNOSES: 1. Intrauterine pregnancy at term. 2. Active labor.  DISCHARGE DIAGNOSES: 1. Intrauterine pregnancy at term. 2. Active labor. 3. Failure to descend.  PROCEDURES:  Primary low transverse cesarean section.  HOSPITAL COURSE:  Jaclyn Howe is a 25 year old gravida 2, para 0-0-1-0 who presented at 38-0/7 weeks in active labor.  Her pregnancy has been followed by the [facility] and has been essentially unremarkable other than marijuana use during pregnancy.  Upon admission, the patient was 5 cm.  Fetal heart rate tracing was reactive.  She was contracting regularly.  Her vital signs were stable.  She was given a room on Labor and Delivery.  The patient progressed to complete dilation and pushed for approximately 4 hours with no further descent beyond +2 station.  At that point, cesarean section was reviewed with her, and she agreed to proceed.  She was taken to the operating room by Dr. Jolayne Panther. Infant was a viable female with Apgars of 2 and 8, cord pH 7.30.  Infant was taken to the NICU unit.  The patient tolerated the remainder of the cesarean section well and was taken to the recovery room in good condition.  By postop day #1, her vital signs were stable.  CBC: Hemoglobin 9.6, hematocrit 29.1, white blood cell count 19.4, and platelets 229,000.  Preoperatively, her hemoglobin was 11.4, hematocrit 34.4, white blood cell count 11.2, and platelets 243,000.  She was planning to breast feed, and she expressed a desire to use Implanon for contraception.  By postop day #2, she continued to do well and by  day #3, she was deemed to have received a full benefit of her hospital stay. Her staples will be removed prior to her discharge from the hospital. Discharge instructions per postpartum handout.  DISCHARGE MEDICATIONS: 1. Motrin 600 mg one p.o. q.6 h. p.r.n. pain. 2. Percocet 5/325 one p.o. q.4 h. p.r.n. severe pain.  Discharge followup will occur at [facility] in 4-6 weeks or sooner if needed.     Cam Hai, C.N.M.   ______________________________ Lesly Dukes, M.D.    KS/MEDQ  D:  05/27/2011  T:  05/27/2011  Job:  [id number]  Electronically Signed by Cam Hai C.N.M. on 06/03/2011 10:26:30 PM Electronically Signed by Elsie Lincoln M.D. on 06/11/2011 01:42:56 PM

## 2011-06-20 NOTE — Op Note (Signed)
Jaclyn Howe, Jaclyn Howe             ACCOUNT NO.:  [id number]  MEDICAL RECORD NO.:  [id number]           PATIENT TYPE:  I  LOCATION:  9318                          FACILITY:  [facility]  PHYSICIAN:  Catalina Antigua, MD     DATE OF BIRTH:  Feb 21, 1986  DATE OF PROCEDURE:  05/24/2011 DATE OF DISCHARGE:                              OPERATIVE REPORT   PREOPERATIVE DIAGNOSIS:  A 25 year old G2, P0-0-1-0 at 38 weeks with failure to descend.  POSTOPERATIVE DIAGNOSIS:  A 25 year old G2, P0-0-1-0 at 38 weeks with failure to descend.  PROCEDURE:  Primary low-transverse cesarean section.  SURGEON:  Catalina Antigua, MD  ASSISTANT:  Dr. Luan Pulling, family practice resident  ANESTHESIA:  Epidural.  ESTIMATED BLOOD LOSS:  700 mL.  COMPLICATIONS:  None.  FINDINGS:  A viable female infant with Apgars of 2 and 8.  Cord pH of 7.30 and there was grossly normal uterus, tubes, and ovaries x2.    Specimens: placenta and was sent to Pathology.    After informed consent was obtained, the patient was taken to the operating room where anesthesia was induced and found to be adequate.  The patient was placed in dorsal supine position with a leftward tilt.  A Pfannenstiel skin incision was made with the scalpel and carried down to the underlying layer of fascia with the Bovie.  The fascia was incised in the midline, incision was extended laterally with the Mayo scissors.  The inferior aspect of fascial incision was grasped with Kocher clamps, tented up, and the underlying rectus muscle dissected off.  Attention was then turned to superior aspect of fascial incision which in similar fashion was grasped, tented up, with Kocher clamps and the underlying rectus muscle dissected off.  The rectus muscle was separated in midline, perineum identified, tented up, and entered bluntly.  This incision was extended superiorly and inferiorly with good visualization of the bladder.  The vesicouterine perineum was identified,  tented up and entered bluntly with Metzenbaum scissors.  This incision was extended laterally and the bladder flap created digitally.  The Alexis self- retaining retractor was introduced into the abdominal cavity.  The lower uterine segment was incised in transverse fashion from the scalpel. This incision was extended laterally with the bandage scissors.  The infant's head was delivered atraumatically after having the assistance of a nurse to help push up on the head from the vaginal area.  The nose and mouth were bulb suctioned.  The cord clamped and cut.  The infant was handed to the awaiting pediatrician.  Cord gases were sent.  The placenta was delivered via gentle uterine massage.  The uterus was cleared of all clots and debris.  Incision was repaired with Vicryl in a running locked fashion.  Excellent hemostasis was noted.  The gutters were cleared of all clots and debris.  Copious irrigation of the abdomen was then performed.  Again, hemostasis was visualized.  The fascia was reapproximated with 0 Vicryl, and the skin was closed with staples.  The patient tolerated the procedure well.  Sponge, lap, and needle count were correct x2.     Catalina Antigua, MD  PC/MEDQ  D:  05/24/2011  T:  05/25/2011  Job:  [id number]  Electronically Signed by Catalina Antigua  on 06/20/2011 09:05:40 AM

## 2011-09-14 LAB — STREP A DNA PROBE

## 2011-09-14 LAB — RAPID STREP SCREEN (MED CTR MEBANE ONLY): Streptococcus, Group A Screen (Direct): NEGATIVE

## 2011-09-17 LAB — URINE MICROSCOPIC-ADD ON

## 2011-09-17 LAB — GC/CHLAMYDIA PROBE AMP, GENITAL: GC Probe Amp, Genital: NEGATIVE

## 2011-09-17 LAB — WET PREP, GENITAL
Trich, Wet Prep: NONE SEEN
Yeast Wet Prep HPF POC: NONE SEEN

## 2011-09-17 LAB — URINALYSIS, ROUTINE W REFLEX MICROSCOPIC
Bilirubin Urine: NEGATIVE
Specific Gravity, Urine: 1.02
Urobilinogen, UA: 0.2

## 2011-09-20 LAB — RAPID URINE DRUG SCREEN, HOSP PERFORMED
Amphetamines: NOT DETECTED
Benzodiazepines: NOT DETECTED
Cocaine: NOT DETECTED
Opiates: NOT DETECTED
Tetrahydrocannabinol: POSITIVE — AB

## 2011-09-25 LAB — CBC
HCT: 42.8
Hemoglobin: 14.7
MCHC: 34.4
MCV: 97.8
RBC: 4.37
WBC: 9.9

## 2011-09-25 LAB — DIFFERENTIAL
Basophils Absolute: 0
Eosinophils Relative: 1
Lymphocytes Relative: 23
Neutrophils Relative %: 73

## 2011-09-25 LAB — RAPID URINE DRUG SCREEN, HOSP PERFORMED
Barbiturates: NOT DETECTED
Opiates: NOT DETECTED

## 2011-09-25 LAB — URINALYSIS, ROUTINE W REFLEX MICROSCOPIC
Nitrite: NEGATIVE
Protein, ur: NEGATIVE
Specific Gravity, Urine: 1.02
Urobilinogen, UA: 0.2

## 2011-09-25 LAB — COMPREHENSIVE METABOLIC PANEL
AST: 25
BUN: 7
CO2: 24
Chloride: 112
Creatinine, Ser: 0.71
GFR calc Af Amer: >60
GFR calc non Af Amer: >60
Glucose, Bld: 116 — ABNORMAL HIGH
Total Bilirubin: 0.7

## 2011-10-04 LAB — CBC
HCT: 35.4 — ABNORMAL LOW
HCT: 43
Hemoglobin: 13.9
MCHC: 33.9
MCHC: 34.8
MCV: 96.4
Platelets: 220
Platelets: 308
RBC: 4.24
RBC: 4.46
WBC: 15.5 — ABNORMAL HIGH
WBC: 6.2
WBC: 9

## 2011-10-04 LAB — DIFFERENTIAL
Basophils Absolute: 0
Basophils Relative: 0
Basophils Relative: 0
Basophils Relative: 1
Eosinophils Absolute: 0
Eosinophils Absolute: 0.1
Eosinophils Absolute: 0.1
Eosinophils Relative: 0
Eosinophils Relative: 1
Lymphs Abs: 2.5
Monocytes Absolute: 0.4
Neutrophils Relative %: 65

## 2011-10-04 LAB — COMPREHENSIVE METABOLIC PANEL
ALT: 28
AST: 48 — ABNORMAL HIGH
Albumin: 4.2
Alkaline Phosphatase: 58
Alkaline Phosphatase: 70
CO2: 24
Chloride: 107
Chloride: 109
GFR calc Af Amer: >60
GFR calc non Af Amer: >60
Glucose, Bld: 115 — ABNORMAL HIGH
Potassium: 3.9
Potassium: 4.2
Sodium: 136
Total Bilirubin: 1.4 — ABNORMAL HIGH
Total Bilirubin: 1.7 — ABNORMAL HIGH

## 2011-10-04 LAB — BASIC METABOLIC PANEL
BUN: 5 — ABNORMAL LOW
CO2: 26
Chloride: 112
Glucose, Bld: 94
Potassium: 4.1

## 2011-10-04 LAB — URINALYSIS, ROUTINE W REFLEX MICROSCOPIC
Glucose, UA: NEGATIVE
Glucose, UA: NEGATIVE
Hgb urine dipstick: NEGATIVE
Ketones, ur: >80 — AB
Protein, ur: 30 — AB
Protein, ur: NEGATIVE
pH: 6

## 2011-10-04 LAB — H. PYLORI ANTIBODY, IGG: H Pylori IgG: 1 — ABNORMAL HIGH

## 2011-10-04 LAB — URINE MICROSCOPIC-ADD ON

## 2011-10-04 LAB — GC/CHLAMYDIA PROBE AMP, GENITAL: GC Probe Amp, Genital: NEGATIVE

## 2011-10-04 LAB — WET PREP, GENITAL: Trich, Wet Prep: NONE SEEN

## 2011-10-11 LAB — STREP A DNA PROBE: Group A Strep Probe: NEGATIVE

## 2012-03-07 IMAGING — CR DG CHEST 2V
2 series · 2 of 2 positions shown · non-contrast
Comparison: 09/14/2007

CLINICAL DATA: Cough and vomiting.

CHEST - 2 VIEW

[view not recorded (1 of 2)]
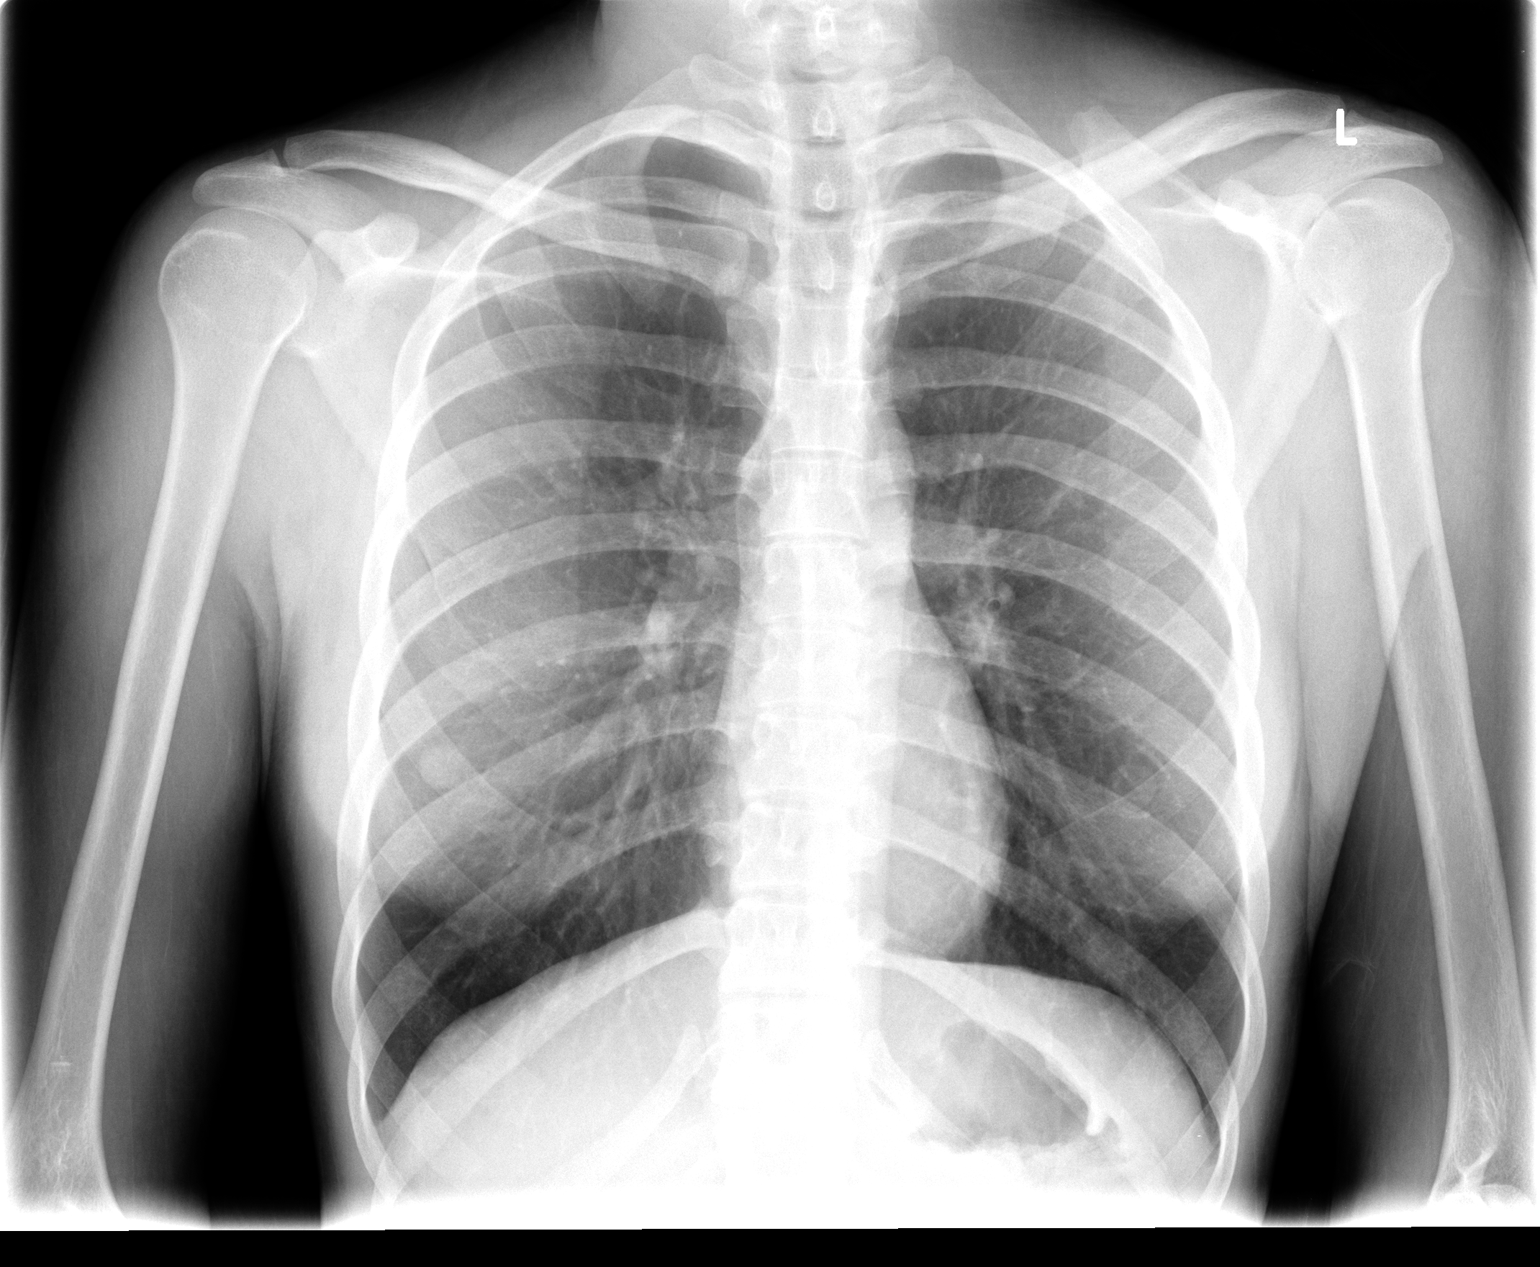

[view not recorded (2 of 2)]
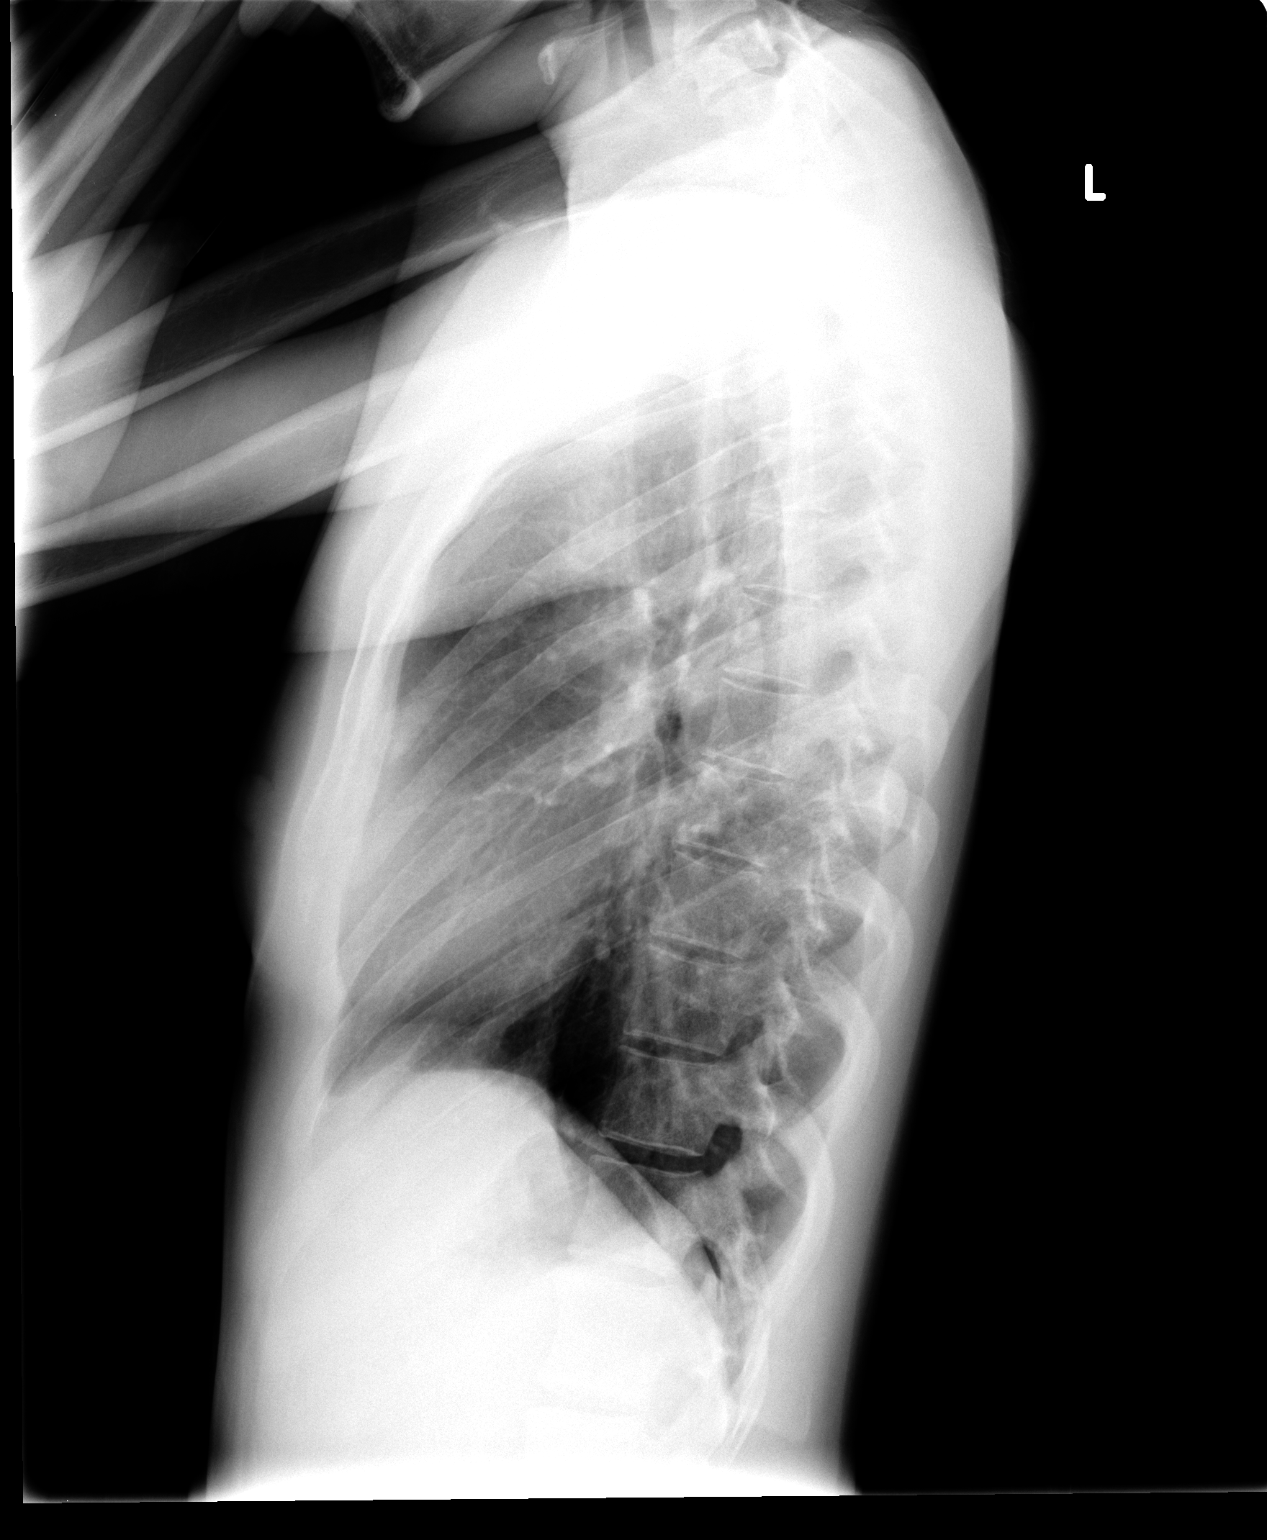

[2 of 2 positions shown; findings below may reference images not displayed]

FINDINGS: The cardiac silhouette, mediastinal and hilar contours
are within normal limits.  The lungs are clear.  The bony thorax is
intact.
IMPRESSION: Normal chest x-ray.

## 2012-08-22 ENCOUNTER — Telehealth: Payer: Self-pay | Admitting: Family Medicine

## 2012-08-22 NOTE — Telephone Encounter (Signed)
Pt wants a letter stating that she is able to get her checks in her name. They have been going to her aunt but she wants a letter stating they can be in her name again

## 2012-08-23 NOTE — Telephone Encounter (Signed)
Needs an appointment to re establish when one is available, has not been here for over 1 year, pleas let her know

## 2012-09-05 NOTE — Telephone Encounter (Signed)
Called pt left message .

## 2012-09-11 ENCOUNTER — Ambulatory Visit (INDEPENDENT_AMBULATORY_CARE_PROVIDER_SITE_OTHER): Payer: Medicaid Other | Admitting: Family Medicine

## 2012-09-11 ENCOUNTER — Encounter: Payer: Self-pay | Admitting: Family Medicine

## 2012-09-11 VITALS — BP 116/58 | HR 96 | Resp 18 | Ht 61.0 in | Wt 110.0 lb

## 2012-09-11 DIAGNOSIS — R5383 Other fatigue: Secondary | ICD-10-CM

## 2012-09-11 DIAGNOSIS — Z13220 Encounter for screening for lipoid disorders: Secondary | ICD-10-CM

## 2012-09-11 DIAGNOSIS — J209 Acute bronchitis, unspecified: Secondary | ICD-10-CM | POA: Insufficient documentation

## 2012-09-11 DIAGNOSIS — Z139 Encounter for screening, unspecified: Secondary | ICD-10-CM

## 2012-09-11 DIAGNOSIS — R5381 Other malaise: Secondary | ICD-10-CM

## 2012-09-11 DIAGNOSIS — Z309 Encounter for contraceptive management, unspecified: Secondary | ICD-10-CM

## 2012-09-11 MED ORDER — MEDROXYPROGESTERONE ACETATE 150 MG/ML IM SUSP
150.0000 mg | Freq: Once | INTRAMUSCULAR | Status: AC
  Administered 2012-09-11: 150 mg via INTRAMUSCULAR

## 2012-09-11 MED ORDER — BENZONATATE 100 MG PO CAPS: 100.0000 mg | ORAL_CAPSULE | Freq: Three times a day (TID) | ORAL | Status: DC | PRN

## 2012-09-11 MED ORDER — PENICILLIN V POTASSIUM 500 MG PO TABS: 500.0000 mg | ORAL_TABLET | Freq: Three times a day (TID) | ORAL | Status: DC

## 2012-09-11 NOTE — Patient Instructions (Addendum)
CPE in November  You are being treated for brnchitis   If your urine pregnancy test today is negative, you will receive your depo injection today  Fasting CBC, lipid, cmp , TSH and vit D in 6 weeks BEFORE visit  You will receive a letter today in support of your ability to control your own finances

## 2012-09-11 NOTE — Progress Notes (Signed)
Patient instructed to come back on December 12th for next depo provera injection

## 2012-09-11 NOTE — Progress Notes (Signed)
  Subjective:    Patient ID: Jaclyn Howe, female    DOB: 03-24-1986, 26 y.o.   MRN: [id number]  HPI The PT is here for re establishment of care Preventive health is updated, specifically  Cancer screening and Immunization.   Has had a baby, her first , since last seen. Lives with the father of her child , who she states is supportive and responsible. Pt states she no longer drinks or smokes cigarettes, denies street drug use , and is capable of math computation, as well as is able to itemize living expenses as compared to income. States her life is changed with the birth of her child, tired of trying to find her aunt, and having to wait for monthly stipend, wants to be responsible for handling her finances. States her partner works and contributes 3 day h/o chest congestion with yellow sputum and chills. Wants control of her money lMP end August, on no contraception will check pregnancy status and start depo if negative test result    Review of Systems See HPI Denies recent fever or chills. Denies sinus pressure, nasal congestion, ear pain or sore throat.  Denies chest pains, palpitations and leg swelling Denies abdominal pain, nausea, vomiting,diarrhea or constipation.   Denies dysuria, frequency, hesitancy or incontinence. Denies joint pain, swelling and limitation in mobility. Denies headaches, seizures, numbness, or tingling. Denies depression, anxiety or insomnia. Denies skin break down or rash.        Objective:   Physical Exam  Patient alert and oriented and in no cardiopulmonary distress.  HEENT: No facial asymmetry, EOMI, no sinus tenderness,  oropharynx pink and moist.  Neck supple no adenopathy.  Chest: decreased air entry scattered  crackles and wheezes  VS: S1, S2 no murmurs, no S3.  ABD: Soft non tender. Bowel sounds normal.  Ext: No edema  MS: Adequate ROM spine, shoulders, hips and knees.  Skin: Intact, no ulcerations or rash noted.  Psych: Good  eye contact, normal affect. Memory intact not anxious or depressed appearing.  CNS: CN 2-12 intact, power, tone and sensation normal throughout.       Assessment & Plan:

## 2012-09-13 ENCOUNTER — Encounter: Payer: Self-pay | Admitting: Family Medicine

## 2012-09-13 NOTE — Assessment & Plan Note (Signed)
Quit in 2012

## 2012-09-13 NOTE — Assessment & Plan Note (Signed)
Acute episode, antibiotic and decongestant prescribed 

## 2012-10-24 ENCOUNTER — Encounter: Payer: Medicaid Other | Admitting: Family Medicine

## 2012-12-04 ENCOUNTER — Ambulatory Visit: Payer: Medicaid Other

## 2013-03-05 ENCOUNTER — Observation Stay (HOSPITAL_COMMUNITY)
Admission: EM | Admit: 2013-03-05 | Discharge: 2013-03-06 | Disposition: A | Discharge status: Home / Self Care | Payer: Medicaid Other | Attending: Internal Medicine | Admitting: Internal Medicine

## 2013-03-05 ENCOUNTER — Emergency Department (HOSPITAL_COMMUNITY): Payer: Medicaid Other

## 2013-03-05 ENCOUNTER — Encounter (HOSPITAL_COMMUNITY): Payer: Self-pay | Admitting: *Deleted

## 2013-03-05 DIAGNOSIS — A084 Viral intestinal infection, unspecified: Secondary | ICD-10-CM | POA: Diagnosis present

## 2013-03-05 DIAGNOSIS — E860 Dehydration: Secondary | ICD-10-CM | POA: Insufficient documentation

## 2013-03-05 DIAGNOSIS — R112 Nausea with vomiting, unspecified: Secondary | ICD-10-CM | POA: Insufficient documentation

## 2013-03-05 DIAGNOSIS — R109 Unspecified abdominal pain: Secondary | ICD-10-CM | POA: Insufficient documentation

## 2013-03-05 DIAGNOSIS — K922 Gastrointestinal hemorrhage, unspecified: Secondary | ICD-10-CM

## 2013-03-05 DIAGNOSIS — A088 Other specified intestinal infections: Principal | ICD-10-CM | POA: Insufficient documentation

## 2013-03-05 DIAGNOSIS — K920 Hematemesis: Secondary | ICD-10-CM | POA: Diagnosis present

## 2013-03-05 LAB — BASIC METABOLIC PANEL
CO2: 23 mEq/L (ref 19–32)
Chloride: 107 mEq/L (ref 96–112)
GFR calc Af Amer: >90 mL/min (ref >90)
Potassium: 3.6 mEq/L (ref 3.5–5.1)
Sodium: 146 mEq/L — ABNORMAL HIGH (ref 135–145)

## 2013-03-05 LAB — URINALYSIS, ROUTINE W REFLEX MICROSCOPIC
Glucose, UA: NEGATIVE mg/dL
Hgb urine dipstick: NEGATIVE
Specific Gravity, Urine: >1.03 — ABNORMAL HIGH (ref 1.005–1.030)
Urobilinogen, UA: 0.2 mg/dL (ref 0.0–1.0)
pH: 6.5 (ref 5.0–8.0)

## 2013-03-05 LAB — CBC WITH DIFFERENTIAL/PLATELET
Basophils Absolute: 0 10*3/uL (ref 0.0–0.1)
Basophils Relative: 0 % (ref 0–1)
HCT: 41 % (ref 36.0–46.0)
Lymphocytes Relative: 13 % (ref 12–46)
MCHC: 34.1 g/dL (ref 30.0–36.0)
Monocytes Absolute: 0.6 10*3/uL (ref 0.1–1.0)
Neutro Abs: 9.2 10*3/uL — ABNORMAL HIGH (ref 1.7–7.7)
Neutrophils Relative %: 82 % — ABNORMAL HIGH (ref 43–77)
RDW: 13.6 % (ref 11.5–15.5)
WBC: 11.1 10*3/uL — ABNORMAL HIGH (ref 4.0–10.5)

## 2013-03-05 LAB — URINE MICROSCOPIC-ADD ON

## 2013-03-05 LAB — HEPATIC FUNCTION PANEL
ALT: 17 U/L (ref 0–35)
AST: 20 U/L (ref 0–37)
Albumin: 4.5 g/dL (ref 3.5–5.2)
Alkaline Phosphatase: 82 U/L (ref 39–117)
Indirect Bilirubin: 0.3 mg/dL (ref 0.3–0.9)
Total Protein: 7.8 g/dL (ref 6.0–8.3)

## 2013-03-05 LAB — POCT PREGNANCY, URINE: Preg Test, Ur: NEGATIVE

## 2013-03-05 MED ORDER — PROMETHAZINE HCL 25 MG/ML IJ SOLN: 12.5000 mg | Freq: Four times a day (QID) | INTRAMUSCULAR | Status: DC | PRN

## 2013-03-05 MED ORDER — PANTOPRAZOLE SODIUM 40 MG IV SOLR
40.0000 mg | Freq: Two times a day (BID) | INTRAVENOUS | Status: DC
  Administered 2013-03-05 – 2013-03-06 (×2): 40 mg via INTRAVENOUS
  Filled 2013-03-05 (×2): qty 40

## 2013-03-05 MED ORDER — HYDROMORPHONE HCL PF 1 MG/ML IJ SOLN
0.5000 mg | Freq: Once | INTRAMUSCULAR | Status: AC
  Administered 2013-03-05: 0.5 mg via INTRAVENOUS
  Filled 2013-03-05: qty 1

## 2013-03-05 MED ORDER — ACETAMINOPHEN 325 MG PO TABS: 650.0000 mg | ORAL_TABLET | Freq: Four times a day (QID) | ORAL | Status: DC | PRN

## 2013-03-05 MED ORDER — ALUM & MAG HYDROXIDE-SIMETH 200-200-20 MG/5ML PO SUSP: 30.0000 mL | Freq: Four times a day (QID) | ORAL | Status: DC | PRN

## 2013-03-05 MED ORDER — ONDANSETRON HCL 4 MG/2ML IJ SOLN
4.0000 mg | Freq: Once | INTRAMUSCULAR | Status: AC
  Administered 2013-03-05: 4 mg via INTRAVENOUS
  Filled 2013-03-05: qty 2

## 2013-03-05 MED ORDER — SODIUM CHLORIDE 0.9 % IV SOLN
1000.0000 mL | Freq: Once | INTRAVENOUS | Status: AC
  Administered 2013-03-05: 1000 mL via INTRAVENOUS

## 2013-03-05 MED ORDER — POTASSIUM CHLORIDE IN NACL 20-0.9 MEQ/L-% IV SOLN
INTRAVENOUS | Status: DC
  Administered 2013-03-05 – 2013-03-06 (×2): via INTRAVENOUS

## 2013-03-05 MED ORDER — PANTOPRAZOLE SODIUM 40 MG IV SOLR
40.0000 mg | Freq: Once | INTRAVENOUS | Status: AC
  Administered 2013-03-05: 40 mg via INTRAVENOUS
  Filled 2013-03-05: qty 40

## 2013-03-05 MED ORDER — ONDANSETRON HCL 4 MG/2ML IJ SOLN
4.0000 mg | Freq: Four times a day (QID) | INTRAMUSCULAR | Status: DC | PRN
  Filled 2013-03-05: qty 2

## 2013-03-05 MED ORDER — PROMETHAZINE HCL 25 MG/ML IJ SOLN
12.5000 mg | Freq: Once | INTRAMUSCULAR | Status: AC
  Administered 2013-03-05: 12.5 mg via INTRAVENOUS
  Filled 2013-03-05: qty 1

## 2013-03-05 MED ORDER — ACETAMINOPHEN 650 MG RE SUPP: 650.0000 mg | Freq: Four times a day (QID) | RECTAL | Status: DC | PRN

## 2013-03-05 MED ORDER — IOHEXOL 300 MG/ML  SOLN
100.0000 mL | Freq: Once | INTRAMUSCULAR | Status: AC | PRN
  Administered 2013-03-05: 100 mL via INTRAVENOUS

## 2013-03-05 MED ORDER — IOHEXOL 300 MG/ML  SOLN
50.0000 mL | INTRAMUSCULAR | Status: AC
  Administered 2013-03-05: 50 mL via ORAL

## 2013-03-05 MED ORDER — SODIUM CHLORIDE 0.9 % IV SOLN
1000.0000 mL | INTRAVENOUS | Status: DC
  Administered 2013-03-05: 1000 mL via INTRAVENOUS

## 2013-03-05 MED ORDER — HYDROMORPHONE HCL PF 1 MG/ML IJ SOLN: 0.5000 mg | INTRAMUSCULAR | Status: DC | PRN

## 2013-03-05 MED ORDER — ONDANSETRON HCL 4 MG/2ML IJ SOLN
4.0000 mg | Freq: Four times a day (QID) | INTRAMUSCULAR | Status: DC | PRN
  Administered 2013-03-05: 4 mg via INTRAVENOUS

## 2013-03-05 MED ORDER — ONDANSETRON HCL 4 MG PO TABS: 4.0000 mg | ORAL_TABLET | Freq: Four times a day (QID) | ORAL | Status: DC | PRN

## 2013-03-05 MED ORDER — ONDANSETRON HCL 4 MG/2ML IJ SOLN: 4.0000 mg | INTRAMUSCULAR | Status: DC | PRN

## 2013-03-05 NOTE — ED Notes (Signed)
Ice chips provided per direction of PA

## 2013-03-05 NOTE — Progress Notes (Signed)
Patient in room.  IV fluids on pump.  Patient has no c/o pain or nausea at this time.  Dietary called to bring a tray to the room based on diet order.  Drink taken to patient, tolerating PO intake well.  No needs expressed at this time.

## 2013-03-05 NOTE — ED Notes (Signed)
Patient began having N/V last night. Abdominal pain lower quadrant bilaterally 10/10.

## 2013-03-05 NOTE — ED Notes (Signed)
Nausea and vomiting from po contrast, MD made aware.

## 2013-03-05 NOTE — ED Notes (Signed)
Patient states she is unable to provide a urine sample at this time.

## 2013-03-05 NOTE — ED Provider Notes (Signed)
History     CSN: [id number]  Arrival date & time 03/05/13  1029   First MD Initiated Contact with Patient 03/05/13 1030      Chief Complaint  Patient presents with  . Nausea  . Emesis  . Abdominal Pain    (Consider location/radiation/quality/duration/timing/severity/associated sxs/prior treatment) Patient is a 27 y.o. female presenting with vomiting and abdominal pain. The history is provided by the patient.  Emesis Severity:  Moderate Duration:  1 day Timing:  Intermittent Quality:  Stomach contents Progression:  Worsening Chronicity:  New Recent urination:  Normal Relieved by:  Nothing Worsened by:  Nothing tried Associated symptoms: abdominal pain   Associated symptoms: no arthralgias, no diarrhea, no fever and no URI   Risk factors: no alcohol use, no diabetes, not pregnant now, no prior abdominal surgery, no sick contacts, no suspect food intake and no travel to endemic areas   Abdominal Pain Associated symptoms: vomiting   Associated symptoms: no chest pain, no cough, no diarrhea, no dysuria, no hematuria and no shortness of breath     History reviewed. No pertinent past medical history.  History reviewed. No pertinent past surgical history.  No family history on file.  History  Substance Use Topics  . Smoking status: Former Games developer  . Smokeless tobacco: Not on file  . Alcohol Use: No    OB History   Grav Para Term Preterm Abortions TAB SAB Ect Mult Living   1 1 1              Review of Systems  Constitutional: Negative for activity change.       All ROS Neg except as noted in HPI  HENT: Negative for nosebleeds and neck pain.   Eyes: Negative for photophobia and discharge.  Respiratory: Negative for cough, shortness of breath and wheezing.   Cardiovascular: Negative for chest pain and palpitations.  Gastrointestinal: Positive for vomiting and abdominal pain. Negative for diarrhea and blood in stool.  Genitourinary: Negative for dysuria, frequency and  hematuria.  Musculoskeletal: Negative for back pain and arthralgias.  Skin: Negative.   Neurological: Negative for dizziness, seizures and speech difficulty.  Psychiatric/Behavioral: Negative for hallucinations and confusion.    Allergies  Review of patient's allergies indicates no known allergies.  Home Medications   Current Outpatient Rx  Name  Route  Sig  Dispense  Refill  . benzonatate (TESSALON PERLES) 100 MG capsule   Oral   Take 1 capsule (100 mg total) by mouth 3 (three) times daily as needed for cough.   30 capsule   0   . penicillin v potassium (VEETID) 500 MG tablet   Oral   Take 1 tablet (500 mg total) by mouth 3 (three) times daily.   30 tablet   0     BP 134/90  Pulse 54  Temp(Src) 97.5 F (36.4 C) (Oral)  Resp 13  Ht 5\' 1"  (1.549 m)  Wt 100 lb (45.36 kg)  BMI 18.9 kg/m2  SpO2 100%  Physical Exam  Nursing note and vitals reviewed. Constitutional: She is oriented to person, place, and time. She appears well-developed and well-nourished.  Non-toxic appearance.  HENT:  Head: Normocephalic.  Right Ear: Tympanic membrane and external ear normal.  Left Ear: Tympanic membrane and external ear normal.  Eyes: EOM and lids are normal. Pupils are equal, round, and reactive to light.  Neck: Normal range of motion. Neck supple. Carotid bruit is not present.  Cardiovascular: Normal rate, regular rhythm, normal heart sounds, intact distal  pulses and normal pulses.   Pulmonary/Chest: Breath sounds normal. No respiratory distress.  Abdominal: Soft. There is tenderness. There is no rebound and no guarding.  Diffuse abd pain. No mass. No distention.  Genitourinary:  Chaperone present during exam. No abnormal external anal findings. No rectal mass. Stool neg for occult blood.  Musculoskeletal: Normal range of motion.  Lymphadenopathy:       Head (right side): No submandibular adenopathy present.       Head (left side): No submandibular adenopathy present.    She has  no cervical adenopathy.  Neurological: She is alert and oriented to person, place, and time. She has normal strength. No cranial nerve deficit or sensory deficit.  Skin: Skin is warm and dry.  Psychiatric: She has a normal mood and affect. Her speech is normal.    ED Course  Procedures (including critical care time)  Labs Reviewed - No data to display No results found.   No diagnosis found.    MDM  I have reviewed nursing notes, vital signs, and all appropriate lab and imaging results for this patient. Pt states she has been having abd pain for about a week. N/V started last night. No diarrhea. No reported fever. No hx of injury or trauma. No ETOH or aspirin use. Vomitus is gastrocult POS.   The patient had some relief from vomiting and with the IV Zofran until she began to drink the oral contrast for CT when she began to vomit again. The patient was given IV promethazine and this seems to have calmed her down for a moment.  The urinalysis shows a specific gravity of greater than 1.030 with 100 mg per decaliter of protein. Otherwise within normal limits. The lipase is normal. WBC 11.1(high) cbc o/w wnl. CT abd/pelvis wnl. No free fluids or air. Appendix normal. Pt had blood mixed mucous in the vomitus while in ED more than1 time. Case discussed with hospitalist. They with admit pt.     Kathie Dike, PA-C 03/10/13 [phone]

## 2013-03-05 NOTE — H&P (Signed)
Hospital Admission Note Date: 03/05/2013  Patient name: Jaclyn Howe Medical record number: [id number] Date of birth: 1986/03/17 Age: 27 y.o. Gender: female PCP: Syliva Overman, MD  Attending physician: Christiane Ha, MD  Chief Complaint: vomiting  History of Present Illness:  Jaclyn Howe is an 27 y.o. female presents with intractable vomiting since yesterday. She reports having eaten a pizza with pepperoni. Several hours after began vomiting. Initially, undigested food. She vomited all night and her emesis began to have coffee ground and blood tinged. She has abdominal pain from vomiting so many times. She reports that she spent time with a relative yesterday with similar symptoms. Patient denies fevers chills or diarrhea. She had a CAT scan in the emergency room which was unremarkable. She had a normal bowel movement yesterday. She has had several bouts of emesis in the emergency room, reportedly with coffee ground material. Hemoccult positive. Blood pressure and hemoglobin are normal. Urinalysis shows ketones and increased urine specific gravity. Patient currently slightly improved. Requesting something to drink despite nausea. She reports that she takes ibuprofen rarely for migraines. Usually a few times a month. She has no medical problems. Urine pregnancy negative. Denies heavy alcohol use or drug use.  History reviewed. No pertinent past medical history.  Meds: Ibuprofen occasionally  Allergies: Review of patient's allergies indicates no known allergies.  Social history: Patient denies smoking, heavy alcohol use or drug use. She is single and unemployed. She has one child.  Family history: Her aunt has diabetes.  Past surgical history: C-section  Review of Systems: Systems reviewed and as per HPI, otherwise negative.  Physical Exam: Blood pressure 131/76, pulse 70, temperature 97.5 F (36.4 C), temperature source Oral, resp. rate 17, height 5\' 1"  (1.549 m),  weight 45.36 kg (100 lb), SpO2 98.00%. BP 131/76  Pulse 70  Temp(Src) 97.5 F (36.4 C) (Oral)  Resp 17  Ht 5\' 1"  (1.549 m)  Wt 45.36 kg (100 lb)  BMI 18.9 kg/m2  SpO2 98%  General Appearance:    Alert, cooperative, no distress, appears stated age  Head:    Normocephalic, without obvious abnormality, atraumatic  Eyes:    PERRL, conjunctiva/corneas clear, EOM's intact, fundi    benign, both eyes  Ears:    Normal TM's and external ear canals, both ears  Nose:   Nares normal, septum midline, mucosa normal, no drainage    or sinus tenderness  Throat:   dry mucous membranes.   Neck:   Supple, symmetrical, trachea midline, no adenopathy;    thyroid:  no enlargement/tenderness/nodules; no carotid   bruit or JVD  Back:     Symmetric, no curvature, ROM normal, no CVA tenderness  Lungs:     Clear to auscultation bilaterally, respirations unlabored  Chest Wall:    No tenderness or deformity   Heart:    Regular rate and rhythm, S1 and S2 normal, no murmur, rub   or gallop     Abdomen:     normal bowel sounds. Soft. Nondistended. Mild lower abdominal tenderness. No rebound tenderness per   Genitalia:   deferred   Rectal:   per ED Therman Hughlett, heme-negative   Extremities:   Extremities normal, atraumatic, no cyanosis or edema  Pulses:   2+ and symmetric all extremities  Skin:   Skin color, texture, turgor normal, no rashes or lesions  Lymph nodes:   Cervical, supraclavicular, and axillary nodes normal  Neurologic:   CNII-XII intact, normal strength, sensation and reflexes    throughout  Psychiatric: Normal affect.  Lab results: Basic Metabolic Panel:  Recent Labs  40/98/11 1138  NA 146*  K 3.6  CL 107  CO2 23  GLUCOSE 111*  BUN 8  CREATININE 0.62  CALCIUM 9.1   Liver Function Tests:  Recent Labs  03/05/13 1138  AST 20  ALT 17  ALKPHOS 82  BILITOT 0.5  PROT 7.8  ALBUMIN 4.5    Recent Labs  03/05/13 1138  LIPASE 8*   No results found for this basename: AMMONIA,  in  the last 72 hours CBC:  Recent Labs  03/05/13 1138  WBC 11.1*  NEUTROABS 9.2*  HGB 14.0  HCT 41.0  MCV 93.0  PLT 301   Drugs of Abuse     Component Value Date/Time   LABOPIA NONE DETECTED 04/23/2011 1509   COCAINSCRNUR NONE DETECTED 04/23/2011 1509   LABBENZ NONE DETECTED 04/23/2011 1509   AMPHETMU NONE DETECTED 04/23/2011 1509   THCU POSITIVE* 04/23/2011 1509   LABBARB  Value: NONE DETECTED        DRUG SCREEN FOR MEDICAL PURPOSES ONLY.  IF CONFIRMATION IS NEEDED FOR ANY PURPOSE, NOTIFY LAB WITHIN 5 DAYS.        LOWEST DETECTABLE LIMITS FOR URINE DRUG SCREEN Drug Class       Cutoff (ng/mL) Amphetamine      1000 Barbiturate      200 Benzodiazepine   200 Tricyclics       300 Opiates          300 Cocaine          300 THC              50 04/23/2011 1509    Alcohol Level: No results found for this basename: ETH,  in the last 72 hours Urinalysis:  Recent Labs  03/05/13 1315  COLORURINE YELLOW  LABSPEC >1.030*  PHURINE 6.5  GLUCOSEU NEGATIVE  HGBUR NEGATIVE  BILIRUBINUR SMALL*  KETONESUR 15*  PROTEINUR 100*  UROBILINOGEN 0.2  NITRITE NEGATIVE  LEUKOCYTESUR NEGATIVE   Imaging results:  Ct Abdomen Pelvis W Contrast  03/05/2013  *RADIOLOGY REPORT*  Clinical Data: Hematemesis.  Abdominal pain.  CT ABDOMEN AND PELVIS WITH CONTRAST  Technique:  Multidetector CT imaging of the abdomen and pelvis was performed following the standard protocol during bolus administration of intravenous contrast.  Contrast: OMNIPAQUE IOHEXOL 300 MG/ML  SOLN  Comparison: 09/13/2007  Findings: Lung bases are clear.  No effusions.  Heart is normal size.  Liver, gallbladder, spleen, pancreas, adrenals and kidneys are unremarkable.  Appendix is visualized and is normal.  Uterus, adnexa urinary bladder grossly unremarkable.  Small bowel decompressed.  Stomach and colon grossly unremarkable.  No free fluid, free air or adenopathy.  Aorta is normal caliber.  No acute bony abnormality.  IMPRESSION: No acute findings  in the abdomen or pelvis.   Original Report Authenticated By: Charlett Nose, M.D.     Assessment & Plan: Active Problems:   NAUSEA AND VOMITING   Hematemesis   Abdominal pain   Dehydration  Suspect viral gastroenteritis with Mallory-Weiss tear versus gastritis/esophagitis. Patient will be placed on observation, IV fluids, clear liquids, anti-emetics, IV protonix and pain medication as needed. If her hemoglobin, blood pressure and symptoms remain stable, she will likely be able to be discharged tomorrow. Should her hematemesis continue, hemoglobin drops significantly, would require endoscopy. Hold off on GI consult for now.  SULLIVAN,CORINNA L 03/05/2013, 4:17 PM

## 2013-03-06 DIAGNOSIS — R112 Nausea with vomiting, unspecified: Secondary | ICD-10-CM

## 2013-03-06 DIAGNOSIS — A088 Other specified intestinal infections: Secondary | ICD-10-CM

## 2013-03-06 DIAGNOSIS — A084 Viral intestinal infection, unspecified: Secondary | ICD-10-CM | POA: Diagnosis present

## 2013-03-06 LAB — BASIC METABOLIC PANEL
BUN: 7 mg/dL (ref 6–23)
GFR calc Af Amer: >90 mL/min (ref >90)
GFR calc non Af Amer: >90 mL/min (ref >90)
Potassium: 3.5 mEq/L (ref 3.5–5.1)
Sodium: 143 mEq/L (ref 135–145)

## 2013-03-06 LAB — CBC
Hemoglobin: 11.4 g/dL — ABNORMAL LOW (ref 12.0–15.0)
MCHC: 33.6 g/dL (ref 30.0–36.0)
RBC: 3.55 MIL/uL — ABNORMAL LOW (ref 3.87–5.11)

## 2013-03-06 NOTE — Progress Notes (Signed)
UR Chart Review Completed  

## 2013-03-06 NOTE — Discharge Summary (Signed)
Physician Discharge Summary  RICCI PAFF JWJ:[id number] DOB: 1986-10-19 DOA: 03/05/2013  PCP: Syliva Overman, MD  Admit date: 03/05/2013 Discharge date: 03/06/2013  Time spent: 20 minutes  Recommendations for Outpatient Follow-up:  1. Patient advised to hand wash 2. Will followup with her PCP as needed  Discharge Diagnoses:  Principal Problem:   Viral gastroenteritis Active Problems:   NAUSEA AND VOMITING   Hematemesis   Abdominal pain   Dehydration   Discharge Condition: Improved, being discharged home  Diet recommendation: Regular diet  Filed Weights   03/05/13 1033  Weight: 45.36 kg (100 lb)    History of present illness:  3/13:Jaclyn Howe is an 27 y.o. female presents with intractable vomiting since yesterday. She reports having eaten a pizza with pepperoni. Several hours after began vomiting. Initially, undigested food. She vomited all night and her emesis began to have coffee ground and blood tinged. She has abdominal pain from vomiting so many times. She reports that she spent time with a relative yesterday with similar symptoms. Patient denies fevers chills or diarrhea. She had a CAT scan in the emergency room which was unremarkable. She had a normal bowel movement yesterday. She has had several bouts of emesis in the emergency room, reportedly with coffee ground material. Hemoccult positive. Blood pressure and hemoglobin were normal Urinalysis shows ketones and increased urine specific gravity. Patient currently slightly improved. Requesting something to drink despite nausea. She reports that she takes ibuprofen rarely for migraines. Usually a few times a month. She has no medical problems. Urine pregnancy negative. Denies heavy alcohol use or drug use.   Hospital Course:  Patient was treated with IV fluids. By the following morning, she was feeling much better. White count normalized. No fevers. CT of the abdomen and pelvis was unremarkable. By hospital day 2,  hemoglobin had dropped 11.4, however she is having no further episodes of hematemesis. This is likely from mild forceful vomiting. No evidence of GI bleed. She is no longer having the symptoms. Overall diagnosis felt to be a resolving viral gastroenteritis. Patient able to tolerate solid food. She's being discharged home.  Procedures:  None  Consultations:  None  Discharge Exam: Filed Vitals:   03/06/13 0253 03/06/13 0500 03/06/13 1019 03/06/13 1226  BP: 117/61 117/68 127/83 122/78  Pulse: 70 76 58 72  Temp: 98.5 F (36.9 C) 98.7 F (37.1 C) 97.9 F (36.6 C) 97.8 F (36.6 C)  TempSrc: Oral Oral    Resp: 14 14 18 18   Height:      Weight:      SpO2: 100% 100% 100% 98%    General: Alert and oriented x3, in no acute distress Cardiovascular: Regular rate and rhythm, S1-S2 Respiratory: Clear to auscultation bilaterally Abdomen: Soft, nontender, nondistended, positive bowel sounds Extremities: No clubbing or cyanosis or edema   Discharge Instructions  Discharge Orders   Future Orders Complete By Expires     Diet general  As directed     Increase activity slowly  As directed         Medication List     As of 03/06/2013  1:14 PM    Notice      You have not been prescribed any medications.            Follow-up Information   Follow up with Syliva Overman, MD. (As needed)    Contact information:   [address], [address] [place] [address] [phone]        The  results of significant diagnostics from this hospitalization (including imaging, microbiology, ancillary and laboratory) are listed below for reference.    Significant Diagnostic Studies: Ct Abdomen Pelvis W Contrast  03/05/2013   IMPRESSION: No acute findings in the abdomen or pelvis.   Original Report Authenticated By: Charlett Nose, M.D.      Labs: Basic Metabolic Panel:  Recent Labs Lab 03/05/13 1138 03/06/13 0534  NA 146* 143  K 3.6 3.5  CL 107 113*  CO2 23 22  GLUCOSE 111*  100*  BUN 8 7  CREATININE 0.62 0.80  CALCIUM 9.1 7.7*   Liver Function Tests:  Recent Labs Lab 03/05/13 1138  AST 20  ALT 17  ALKPHOS 82  BILITOT 0.5  PROT 7.8  ALBUMIN 4.5    Recent Labs Lab 03/05/13 1138  LIPASE 8*   CBC:  Recent Labs Lab 03/05/13 1138 03/06/13 0534  WBC 11.1* 8.0  NEUTROABS 9.2*  --   HGB 14.0 11.4*  HCT 41.0 33.9*  MCV 93.0 95.5  PLT 301 238     Signed:  KRISHNAN,SENDIL K  [facility] 03/06/2013, 1:14 PM

## 2013-03-06 NOTE — Progress Notes (Signed)
Pt verbalizes understanding of d/c instructions and reasons to call MD. No questions at this time. IV d/c by NT. D/c via wheelchair by myself. [name]

## 2013-03-08 ENCOUNTER — Emergency Department (HOSPITAL_COMMUNITY)
Admission: EM | Admit: 2013-03-08 | Discharge: 2013-03-08 | Disposition: A | Discharge status: Home / Self Care | Payer: Medicaid Other | Attending: Emergency Medicine | Admitting: Emergency Medicine

## 2013-03-08 ENCOUNTER — Encounter (HOSPITAL_COMMUNITY): Payer: Self-pay

## 2013-03-08 DIAGNOSIS — R109 Unspecified abdominal pain: Secondary | ICD-10-CM | POA: Insufficient documentation

## 2013-03-08 DIAGNOSIS — R112 Nausea with vomiting, unspecified: Secondary | ICD-10-CM

## 2013-03-08 DIAGNOSIS — E860 Dehydration: Secondary | ICD-10-CM | POA: Insufficient documentation

## 2013-03-08 DIAGNOSIS — Z87891 Personal history of nicotine dependence: Secondary | ICD-10-CM | POA: Insufficient documentation

## 2013-03-08 LAB — URINALYSIS, ROUTINE W REFLEX MICROSCOPIC
Glucose, UA: NEGATIVE mg/dL
Leukocytes, UA: NEGATIVE
Nitrite: NEGATIVE
Protein, ur: NEGATIVE mg/dL

## 2013-03-08 LAB — POCT I-STAT, CHEM 8
BUN: 3 mg/dL — ABNORMAL LOW (ref 6–23)
HCT: 40 % (ref 36.0–46.0)
Hemoglobin: 13.6 g/dL (ref 12.0–15.0)
Sodium: 141 mEq/L (ref 135–145)
TCO2: 28 mmol/L (ref 0–100)

## 2013-03-08 MED ORDER — RANITIDINE HCL 150 MG PO TABS: 150.0000 mg | ORAL_TABLET | Freq: Two times a day (BID) | ORAL | Status: DC

## 2013-03-08 MED ORDER — PROMETHAZINE HCL 25 MG PO TABS: 25.0000 mg | ORAL_TABLET | Freq: Four times a day (QID) | ORAL | Status: DC | PRN

## 2013-03-08 MED ORDER — SODIUM CHLORIDE 0.9 % IV BOLUS (SEPSIS)
1000.0000 mL | Freq: Once | INTRAVENOUS | Status: AC
  Administered 2013-03-08: 1000 mL via INTRAVENOUS

## 2013-03-08 MED ORDER — MORPHINE SULFATE 4 MG/ML IJ SOLN
2.0000 mg | Freq: Once | INTRAMUSCULAR | Status: AC
  Administered 2013-03-08: 2 mg via INTRAVENOUS
  Filled 2013-03-08: qty 1

## 2013-03-08 MED ORDER — PROMETHAZINE HCL 12.5 MG PO TABS
25.0000 mg | ORAL_TABLET | Freq: Once | ORAL | Status: AC
  Administered 2013-03-08: 25 mg via ORAL
  Filled 2013-03-08: qty 2

## 2013-03-08 MED ORDER — ONDANSETRON 4 MG PO TBDP: 4.0000 mg | ORAL_TABLET | Freq: Three times a day (TID) | ORAL | Status: DC | PRN

## 2013-03-08 MED ORDER — ONDANSETRON HCL 4 MG/2ML IJ SOLN
4.0000 mg | Freq: Once | INTRAMUSCULAR | Status: AC
  Administered 2013-03-08: 4 mg via INTRAVENOUS
  Filled 2013-03-08: qty 2

## 2013-03-08 NOTE — ED Notes (Signed)
Pt c/o n/v since 3/13. Seen 3/13 for same. Denies diarrhea.

## 2013-03-08 NOTE — ED Provider Notes (Signed)
History  This chart was scribed for Vida Roller, MD by Bennett Scrape, ED Scribe. This patient was seen in room APA19/[facility]19 and the patient's care was started at 7:56 PM.  CSN: [id number]  Arrival date & time 03/08/13  4098   First MD Initiated Contact with Patient 03/08/13 1956      Chief Complaint  Patient presents with  . Emesis  . Abdominal Pain     The history is provided by the patient. No language interpreter was used.    Jaclyn Howe is a 27 y.o. female who presents to the Emergency Department complaining of 3 days of gradual onset, non-changing, non-bloody emesis with associated nausea since being discharged for the same on 03/05/13.  She reports prior episodes over the past few years but denies any prior admissions until 3 days ago. She denies being discharged with any prescriptions. She states that she had an EED "a long time ago" and was told that it could be ulcers or gastroenteritis. She denies being on any ulcer medications stating that "I don't know what to take". She denies diarrhea, fever and urinary symptoms as associated symptoms. She does not have a h/o chronic medical conditions and is a former smoker but denies alcohol use.    History reviewed. No pertinent past medical history.  History reviewed. No pertinent past surgical history.  No family history on file.  History  Substance Use Topics  . Smoking status: Former Games developer  . Smokeless tobacco: Not on file  . Alcohol Use: No    OB History   Grav Para Term Preterm Abortions TAB SAB Ect Mult Living   1 1 1              Review of Systems  Constitutional: Negative for fever.  Gastrointestinal: Positive for nausea and vomiting. Negative for abdominal pain and diarrhea.  Neurological: Negative for headaches.  All other systems reviewed and are negative.    Allergies  Review of patient's allergies indicates no known allergies.  Home Medications   Current Outpatient Rx  Name  Route  Sig   Dispense  Refill  . ondansetron (ZOFRAN ODT) 4 MG disintegrating tablet   Oral   Take 1 tablet (4 mg total) by mouth every 8 (eight) hours as needed for nausea.   10 tablet   0   . promethazine (PHENERGAN) 25 MG tablet   Oral   Take 1 tablet (25 mg total) by mouth every 6 (six) hours as needed for nausea.   12 tablet   0   . ranitidine (ZANTAC) 150 MG tablet   Oral   Take 1 tablet (150 mg total) by mouth 2 (two) times daily.   60 tablet   0     Triage Vitals: BP 133/89  Pulse 64  Temp(Src) 98.9 F (37.2 C)  Resp 16  Ht 5\' 1"  (1.549 m)  Wt 100 lb (45.36 kg)  BMI 18.9 kg/m2  SpO2 100%  Physical Exam  Nursing note and vitals reviewed. Constitutional: She is oriented to person, place, and time. She appears well-developed and well-nourished. No distress.  HENT:  Head: Normocephalic and atraumatic.  Mouth/Throat: Oropharynx is clear and moist and mucous membranes are normal.  Eyes: Conjunctivae and EOM are normal. Pupils are equal, round, and reactive to light.  Neck: Neck supple. No tracheal deviation present.  Cardiovascular: Normal rate and regular rhythm.  Exam reveals no gallop and no friction rub.   No murmur heard. HR is 60s  Pulmonary/Chest:  Effort normal and breath sounds normal. No respiratory distress. She has no wheezes. She has no rales.  Abdominal: Soft. There is no tenderness. There is no rebound and no guarding.  Musculoskeletal: Normal range of motion. She exhibits no edema.  Neurological: She is alert and oriented to person, place, and time.  Skin: Skin is warm and dry.  Psychiatric: She has a normal mood and affect. Her behavior is normal.    ED Course  Procedures (including critical care time)  DIAGNOSTIC STUDIES: Oxygen Saturation is 100% on room air, normal by my interpretation.    COORDINATION OF CARE: Per review of pt's medical records, she had an admission on 03/05/13. She had a negative CT scan with a diagnosis of gastroenteritis.  8:15  PM-Discussed treatment plan which includes IV fluids and UA with pt at bedside and pt agreed to plan.   8:30 PM- Ordered 1,000 mL of bolus, 4 mg Zofran injection and 2 mg morphine injection  9:36 PM- Pt rechecked and is feeling improved with medications listed above.  Labs Reviewed  URINALYSIS, ROUTINE W REFLEX MICROSCOPIC - Abnormal; Notable for the following:    pH 8.5 (*)    Ketones, ur 15 (*)    All other components within normal limits  POCT I-STAT, CHEM 8 - Abnormal; Notable for the following:    BUN 3 (*)    All other components within normal limits   No results found.   1. Nausea and vomiting       MDM  At this time the patient appears well, she has been given fluids and medications and feels much improved without any nausea. Urinalysis reviewed and shows no signs of infection, mild ketones, consistent with mild dehydration. She'll be started on Zantac, given prescriptions for both Zofran and Phenergan and appears stable for discharge with family doctor, referral to gastroenterologist would be needed if symptoms persisted. Review of her prior admission to the hospital shows that she had a negative CT scan, benign-appearing abdomen is stable for discharge, I believe that she is stable for discharge at this time as well.   I personally performed the services described in this documentation, which was scribed in my presence. The recorded information has been reviewed and is accurate.   =   Vida Roller, MD 03/08/13 2141

## 2013-03-08 NOTE — ED Notes (Signed)
Drank small amt of gingerale and shortly after vomited a small amount.  MD notified of patients request for something for nausea tonight since drugstore not open.  Order received

## 2013-03-10 ENCOUNTER — Ambulatory Visit: Payer: Medicaid Other | Admitting: Family Medicine

## 2013-03-10 NOTE — ED Provider Notes (Signed)
Medical screening examination/treatment/procedure(s) were performed by non-physician practitioner and as supervising physician I was immediately available for consultation/collaboration.   Kathleen M McManus, DO 03/10/13 1717 

## 2013-04-17 ENCOUNTER — Telehealth: Payer: Self-pay | Admitting: Family Medicine

## 2013-04-17 NOTE — Telephone Encounter (Signed)
The last office note was faxed to the health dept Wed.

## 2013-04-17 NOTE — Telephone Encounter (Signed)
Last office note included the last depo

## 2013-04-20 ENCOUNTER — Telehealth: Payer: Self-pay | Admitting: Family Medicine

## 2013-04-20 NOTE — Telephone Encounter (Signed)
This information has been sent to [facility]

## 2013-05-16 ENCOUNTER — Emergency Department (HOSPITAL_COMMUNITY)
Admission: EM | Admit: 2013-05-16 | Discharge: 2013-05-16 | Disposition: A | Discharge status: Home / Self Care | Payer: Medicaid Other | Attending: Emergency Medicine | Admitting: Emergency Medicine

## 2013-05-16 ENCOUNTER — Encounter (HOSPITAL_COMMUNITY): Payer: Self-pay | Admitting: *Deleted

## 2013-05-16 DIAGNOSIS — R112 Nausea with vomiting, unspecified: Secondary | ICD-10-CM | POA: Insufficient documentation

## 2013-05-16 DIAGNOSIS — K5289 Other specified noninfective gastroenteritis and colitis: Secondary | ICD-10-CM | POA: Insufficient documentation

## 2013-05-16 DIAGNOSIS — Z87891 Personal history of nicotine dependence: Secondary | ICD-10-CM | POA: Insufficient documentation

## 2013-05-16 DIAGNOSIS — K529 Noninfective gastroenteritis and colitis, unspecified: Secondary | ICD-10-CM

## 2013-05-16 DIAGNOSIS — Z3202 Encounter for pregnancy test, result negative: Secondary | ICD-10-CM | POA: Insufficient documentation

## 2013-05-16 LAB — URINALYSIS, ROUTINE W REFLEX MICROSCOPIC
Leukocytes, UA: NEGATIVE
Nitrite: NEGATIVE
Protein, ur: 30 mg/dL — AB
Urobilinogen, UA: 0.2 mg/dL (ref 0.0–1.0)

## 2013-05-16 LAB — URINE MICROSCOPIC-ADD ON

## 2013-05-16 MED ORDER — SODIUM CHLORIDE 0.9 % IV BOLUS (SEPSIS)
1000.0000 mL | Freq: Once | INTRAVENOUS | Status: AC
  Administered 2013-05-16: 1000 mL via INTRAVENOUS

## 2013-05-16 MED ORDER — ONDANSETRON HCL 8 MG PO TABS: 8.0000 mg | ORAL_TABLET | Freq: Three times a day (TID) | ORAL | Status: DC | PRN

## 2013-05-16 MED ORDER — ONDANSETRON HCL 4 MG/2ML IJ SOLN
4.0000 mg | Freq: Once | INTRAMUSCULAR | Status: AC
Start: 2013-05-16 — End: 2013-05-16
  Administered 2013-05-16: 4 mg via INTRAVENOUS
  Filled 2013-05-16: qty 2

## 2013-05-16 NOTE — ED Notes (Signed)
Patient given a Sprite for fluids

## 2013-05-16 NOTE — ED Notes (Signed)
Pt c/o headache, abd pain, n/v that started this am, denies any diarrhea or fevers.

## 2013-05-16 NOTE — ED Provider Notes (Signed)
History     CSN: [id number]  Arrival date & time 05/16/13  1234   First MD Initiated Contact with Patient 05/16/13 1304      Chief Complaint  Patient presents with  . Nausea  . Emesis    (Consider location/radiation/quality/duration/timing/severity/associated sxs/prior treatment) Patient is a 27 y.o. female presenting with vomiting. The history is provided by the patient.  Emesis Associated symptoms: no abdominal pain, no chills, no diarrhea, no headaches and no sore throat   pt c/o nv onset this morning. Notes 3-4 episodes, clear, not bloody or bilious. Denies abd pain. No diarrhea, had normal bm today. No fever or chills. No abd distension or prior abd surgery. lnmp 1 week ago, normal. No vaginal discharge or bleeding. No gu c/o. Denies cp or sob. No cough or uri c/o. No known bad food ingestion or ill contacts.     History reviewed. No pertinent past medical history.  History reviewed. No pertinent past surgical history.  No family history on file.  History  Substance Use Topics  . Smoking status: Former Games developer  . Smokeless tobacco: Not on file  . Alcohol Use: No    OB History   Grav Para Term Preterm Abortions TAB SAB Ect Mult Living   1 1 1              Review of Systems  Constitutional: Negative for fever and chills.  HENT: Negative for sore throat and neck pain.   Eyes: Negative for redness.  Respiratory: Negative for shortness of breath.   Cardiovascular: Negative for chest pain.  Gastrointestinal: Positive for vomiting. Negative for abdominal pain, diarrhea and constipation.  Genitourinary: Negative for dysuria and flank pain.  Musculoskeletal: Negative for back pain.  Skin: Negative for rash.  Neurological: Negative for headaches.  Hematological: Does not bruise/bleed easily.  Psychiatric/Behavioral: Negative for confusion.    Allergies  Review of patient's allergies indicates no known allergies.  Home Medications  No current outpatient  prescriptions on file.  BP 129/81  Pulse 88  Temp(Src) 97.2 F (36.2 C) (Oral)  Resp 18  Ht 5\' 2"  (1.575 m)  Wt 101 lb 3 oz (45.898 kg)  BMI 18.5 kg/m2  SpO2 100%  LMP 05/03/2013  Physical Exam  Nursing note and vitals reviewed. Constitutional: She appears well-developed and well-nourished. No distress.  HENT:  Mouth/Throat: Oropharynx is clear and moist.  Eyes: Conjunctivae are normal. No scleral icterus.  Neck: Neck supple. No tracheal deviation present.  Cardiovascular: Normal rate, regular rhythm, normal heart sounds and intact distal pulses.   Pulmonary/Chest: Effort normal and breath sounds normal. No respiratory distress.  Abdominal: Soft. Normal appearance and bowel sounds are normal. She exhibits no distension and no mass. There is no tenderness. There is no rebound and no guarding.  Genitourinary:  No cva tenderness  Musculoskeletal: She exhibits no edema.  Neurological: She is alert.  Skin: Skin is warm and dry. No rash noted.  Psychiatric: She has a normal mood and affect.    ED Course  Procedures (including critical care time)  Results for orders placed during the hospital encounter of 05/16/13  URINALYSIS, ROUTINE W REFLEX MICROSCOPIC      Result Value Range   Color, Urine YELLOW  YELLOW   APPearance HAZY (*) CLEAR   Specific Gravity, Urine 1.025  1.005 - 1.030   pH 7.0  5.0 - 8.0   Glucose, UA NEGATIVE  NEGATIVE mg/dL   Hgb urine dipstick NEGATIVE  NEGATIVE   Bilirubin Urine NEGATIVE  NEGATIVE   Ketones, ur NEGATIVE  NEGATIVE mg/dL   Protein, ur 30 (*) NEGATIVE mg/dL   Urobilinogen, UA 0.2  0.0 - 1.0 mg/dL   Nitrite NEGATIVE  NEGATIVE   Leukocytes, UA NEGATIVE  NEGATIVE  PREGNANCY, URINE      Result Value Range   Preg Test, Ur NEGATIVE  NEGATIVE  URINE MICROSCOPIC-ADD ON      Result Value Range   Squamous Epithelial / LPF MANY (*) RARE   WBC, UA 3-6  <3 WBC/hpf   Bacteria, UA FEW (*) RARE        MDM  Iv ns bolus. zofran po.  Recheck  feels improved.  Po fluids.  Reviewed nursing notes and prior charts for additional history.   u preg neg. abd soft nt. Tolerating po fluids.  Pt stable for d/c.         Suzi Roots, MD 05/16/13 1414

## 2013-05-17 LAB — URINE CULTURE

## 2013-07-19 ENCOUNTER — Emergency Department (HOSPITAL_COMMUNITY)
Admission: EM | Admit: 2013-07-19 | Discharge: 2013-07-19 | Disposition: A | Discharge status: Home / Self Care | Payer: Medicaid Other | Attending: Emergency Medicine | Admitting: Emergency Medicine

## 2013-07-19 ENCOUNTER — Encounter (HOSPITAL_COMMUNITY): Payer: Self-pay | Admitting: Emergency Medicine

## 2013-07-19 DIAGNOSIS — E860 Dehydration: Secondary | ICD-10-CM | POA: Insufficient documentation

## 2013-07-19 DIAGNOSIS — O219 Vomiting of pregnancy, unspecified: Secondary | ICD-10-CM | POA: Insufficient documentation

## 2013-07-19 DIAGNOSIS — O9989 Other specified diseases and conditions complicating pregnancy, childbirth and the puerperium: Secondary | ICD-10-CM | POA: Insufficient documentation

## 2013-07-19 DIAGNOSIS — R1110 Vomiting, unspecified: Secondary | ICD-10-CM

## 2013-07-19 DIAGNOSIS — Z3490 Encounter for supervision of normal pregnancy, unspecified, unspecified trimester: Secondary | ICD-10-CM

## 2013-07-19 DIAGNOSIS — Z87891 Personal history of nicotine dependence: Secondary | ICD-10-CM | POA: Insufficient documentation

## 2013-07-19 DIAGNOSIS — Z3201 Encounter for pregnancy test, result positive: Secondary | ICD-10-CM | POA: Insufficient documentation

## 2013-07-19 DIAGNOSIS — R109 Unspecified abdominal pain: Secondary | ICD-10-CM | POA: Insufficient documentation

## 2013-07-19 LAB — URINALYSIS, ROUTINE W REFLEX MICROSCOPIC
Ketones, ur: 40 mg/dL — AB
Leukocytes, UA: NEGATIVE
Nitrite: NEGATIVE
Specific Gravity, Urine: >1.03 — ABNORMAL HIGH (ref 1.005–1.030)
Urobilinogen, UA: 1 mg/dL (ref 0.0–1.0)
pH: 6.5 (ref 5.0–8.0)

## 2013-07-19 LAB — COMPREHENSIVE METABOLIC PANEL
Albumin: 4.2 g/dL (ref 3.5–5.2)
Alkaline Phosphatase: 77 U/L (ref 39–117)
BUN: 9 mg/dL (ref 6–23)
Calcium: 9.3 mg/dL (ref 8.4–10.5)
Potassium: 3.8 mEq/L (ref 3.5–5.1)
Sodium: 141 mEq/L (ref 135–145)
Total Protein: 7.9 g/dL (ref 6.0–8.3)

## 2013-07-19 LAB — URINE MICROSCOPIC-ADD ON

## 2013-07-19 MED ORDER — ONDANSETRON 4 MG PO TBDP: ORAL_TABLET | ORAL | Status: DC

## 2013-07-19 MED ORDER — ONDANSETRON HCL 4 MG/2ML IJ SOLN
4.0000 mg | Freq: Once | INTRAMUSCULAR | Status: AC
  Administered 2013-07-19: 4 mg via INTRAVENOUS
  Filled 2013-07-19: qty 2

## 2013-07-19 MED ORDER — SODIUM CHLORIDE 0.9 % IV BOLUS (SEPSIS)
1000.0000 mL | Freq: Once | INTRAVENOUS | Status: AC
  Administered 2013-07-19: 1000 mL via INTRAVENOUS

## 2013-07-19 NOTE — ED Provider Notes (Signed)
CSN: [id number]     Arrival date & time 07/19/13  1646 History     First MD Initiated Contact with Patient 07/19/13 1654     Chief Complaint  Patient presents with  . Emesis   (Consider location/radiation/quality/duration/timing/severity/associated sxs/prior Treatment) HPI Comments: 27 yo female with past smoker, no etoh presents with recurrent vomiting since this am. Family member with similar sxs.  No pain, bleeding.  All foods worsen.  NO hx of GB issues.  No fevers. Nothing improves, intermittent.  No travel. No ulcer hx.   Patient is a 27 y.o. female presenting with vomiting. The history is provided by the patient.  Emesis Severity:  Mild Timing:  Intermittent Associated symptoms: no abdominal pain, no chills and no headaches     History reviewed. No pertinent past medical history. History reviewed. No pertinent past surgical history. No family history on file. History  Substance Use Topics  . Smoking status: Former Games developer  . Smokeless tobacco: Not on file  . Alcohol Use: No   OB History   Grav Para Term Preterm Abortions TAB SAB Ect Mult Living   1 1 1             Review of Systems  Constitutional: Positive for appetite change. Negative for fever and chills.  HENT: Negative for neck pain and neck stiffness.   Eyes: Negative for visual disturbance.  Respiratory: Negative for shortness of breath.   Cardiovascular: Negative for chest pain.  Gastrointestinal: Positive for nausea and vomiting. Negative for abdominal pain and blood in stool.  Genitourinary: Negative for dysuria and flank pain.  Musculoskeletal: Negative for back pain.  Skin: Negative for rash.  Neurological: Negative for light-headedness and headaches.    Allergies  Review of patient's allergies indicates no known allergies.  Home Medications  No current outpatient prescriptions on file. BP 140/89  Pulse 75  Temp(Src) 99.1 F (37.3 C) (Oral)  Resp 24  Wt 100 lb (45.36 kg)  BMI 18.29 kg/m2   SpO2 100%  LMP 07/12/2013 Physical Exam  Nursing note and vitals reviewed. Constitutional: She is oriented to person, place, and time. She appears well-developed and well-nourished.  HENT:  Head: Normocephalic and atraumatic.  Mild dry mm  Eyes: Conjunctivae are normal. Right eye exhibits no discharge. Left eye exhibits no discharge.  Neck: Normal range of motion. Neck supple. No tracheal deviation present.  Cardiovascular: Normal rate and regular rhythm.   Pulmonary/Chest: Effort normal and breath sounds normal.  Abdominal: Soft. She exhibits no distension. There is tenderness (epig and RUQ pain mild). There is no guarding.  Musculoskeletal: She exhibits no edema.  Neurological: She is alert and oriented to person, place, and time.  Skin: Skin is warm. No rash noted.  Psychiatric: She has a normal mood and affect.    ED Course   Procedures (including critical care time) EMERGENCY DEPARTMENT [place] PREGNANCY "Study: Limited Ultrasound of the Pelvis"  INDICATIONS:Pregnancy(required) Multiple views of the uterus and pelvic cavity are obtained with a multi-frequency probe.  APPROACH:Transabdominal   PERFORMED BY: Myself  IMAGES ARCHIVED?: Yes  LIMITATIONS: none  PREGNANCY FREE FLUID: None  PREGNANCY FINDINGS: no pain on exam.  No intrauterine pregnancy visualized, no gestational sac, likely too early   Labs Reviewed - No data to display No results found. No diagnosis found.  MDM  Clinically gerd vs gastritis vs biliary vs viral vs pregnancy Fluids and nausea meds.  Labs Reviewed  URINALYSIS, ROUTINE W REFLEX MICROSCOPIC - Abnormal; Notable for the following:  Specific Gravity, Urine >1.030 (*)    Bilirubin Urine SMALL (*)    Ketones, ur 40 (*)    Protein, ur 30 (*)    All other components within normal limits  PREGNANCY, URINE - Abnormal; Notable for the following:    Preg Test, Ur POSITIVE (*)    All other components within normal limits  URINE MICROSCOPIC-ADD ON  - Abnormal; Notable for the following:    Squamous Epithelial / LPF MANY (*)    Bacteria, UA MANY (*)    All other components within normal limits  COMPREHENSIVE METABOLIC PANEL   Discussed new pregnancy, prenatal care and fup.  No pregnancy visualized on bedside US however likely early, no pain, well appearing and no suspicion for ectopic at this time.  Pt understand fup [place].  Pt improved on dc.    Enid Skeens, MD 07/19/13 2155

## 2013-07-19 NOTE — ED Notes (Signed)
States she started having vomiting with blood in it about 3 hours ago.

## 2013-07-19 NOTE — ED Notes (Signed)
Dc instructions reviewed with pt and explained. 1 script given along with f/u care instructed.

## 2013-07-31 ENCOUNTER — Telehealth: Payer: Self-pay | Admitting: *Deleted

## 2013-07-31 NOTE — Telephone Encounter (Signed)
Pt asking does she need to keep her appt on Monday. Pt states had elective AB. Pt told to keep her scheduled appt for Monday. Verbalized understanding.

## 2013-08-03 ENCOUNTER — Encounter (INDEPENDENT_AMBULATORY_CARE_PROVIDER_SITE_OTHER): Payer: Medicaid Other | Admitting: Obstetrics & Gynecology

## 2013-08-03 ENCOUNTER — Encounter: Payer: Self-pay | Admitting: Obstetrics & Gynecology

## 2013-08-03 ENCOUNTER — Ambulatory Visit (INDEPENDENT_AMBULATORY_CARE_PROVIDER_SITE_OTHER): Payer: Medicaid Other | Admitting: Obstetrics & Gynecology

## 2013-08-03 DIAGNOSIS — Z332 Encounter for elective termination of pregnancy: Secondary | ICD-10-CM | POA: Insufficient documentation

## 2013-08-03 DIAGNOSIS — Z3009 Encounter for other general counseling and advice on contraception: Secondary | ICD-10-CM

## 2013-08-03 DIAGNOSIS — Z3049 Encounter for surveillance of other contraceptives: Secondary | ICD-10-CM

## 2013-08-03 MED ORDER — MEDROXYPROGESTERONE ACETATE 150 MG/ML IM SUSP
150.0000 mg | Freq: Once | INTRAMUSCULAR | Status: AC
  Administered 2013-08-03: 150 mg via INTRAMUSCULAR

## 2013-08-03 MED ORDER — MEDROXYPROGESTERONE ACETATE 150 MG/ML IM SUSP: 150.0000 mg | Freq: Once | INTRAMUSCULAR | Status: DC

## 2013-08-03 NOTE — Patient Instructions (Signed)

## 2013-08-03 NOTE — Addendum Note (Signed)
Addended by: Richardson Chiquito on: 08/03/2013 05:50 PM   Modules accepted: Orders

## 2013-08-03 NOTE — Progress Notes (Signed)
Patient ID: Jaclyn Howe, female   DOB: 31-Aug-1986, 27 y.o.   MRN: [id number] Jaclyn Howe is in for a followup from a elective termination on 07/31/2013 She is in today doing well without complaint No pain Still having bleeding expected No foul-smelling discharge  Exam Cervix is parous blood is in the vault The cervix is nontender Uterus nontender  Impression Normal post abortal exam  Plan Patient wants to restart Depo-Provera which is ordered today from [place] She will bring it back and have her injection today

## 2013-08-04 ENCOUNTER — Encounter: Payer: Self-pay | Admitting: Obstetrics & Gynecology

## 2013-08-04 NOTE — Progress Notes (Signed)
This encounter was created in error - please disregard.

## 2013-10-15 ENCOUNTER — Encounter (HOSPITAL_COMMUNITY): Payer: Self-pay | Admitting: Emergency Medicine

## 2013-10-15 ENCOUNTER — Emergency Department (HOSPITAL_COMMUNITY)
Admission: EM | Admit: 2013-10-15 | Discharge: 2013-10-15 | Disposition: A | Discharge status: Home / Self Care | Payer: Medicaid Other | Attending: Emergency Medicine | Admitting: Emergency Medicine

## 2013-10-15 DIAGNOSIS — Z87891 Personal history of nicotine dependence: Secondary | ICD-10-CM | POA: Insufficient documentation

## 2013-10-15 DIAGNOSIS — J029 Acute pharyngitis, unspecified: Secondary | ICD-10-CM

## 2013-10-15 DIAGNOSIS — R5381 Other malaise: Secondary | ICD-10-CM | POA: Insufficient documentation

## 2013-10-15 DIAGNOSIS — R6883 Chills (without fever): Secondary | ICD-10-CM | POA: Insufficient documentation

## 2013-10-15 MED ORDER — AMOXICILLIN 250 MG PO CAPS
500.0000 mg | ORAL_CAPSULE | Freq: Once | ORAL | Status: AC
  Administered 2013-10-15: 500 mg via ORAL
  Filled 2013-10-15: qty 2

## 2013-10-15 MED ORDER — AMOXICILLIN 500 MG PO CAPS: 500.0000 mg | ORAL_CAPSULE | Freq: Three times a day (TID) | ORAL | Status: DC

## 2013-10-15 MED ORDER — IBUPROFEN 800 MG PO TABS
800.0000 mg | ORAL_TABLET | Freq: Once | ORAL | Status: AC
  Administered 2013-10-15: 800 mg via ORAL
  Filled 2013-10-15: qty 1

## 2013-10-15 MED ORDER — MAGIC MOUTHWASH W/LIDOCAINE: 5.0000 mL | Freq: Three times a day (TID) | ORAL | Status: DC | PRN

## 2013-10-15 MED ORDER — IBUPROFEN 800 MG PO TABS: 800.0000 mg | ORAL_TABLET | Freq: Three times a day (TID) | ORAL | Status: DC

## 2013-10-15 NOTE — ED Notes (Signed)
Sore throat with body aches.

## 2013-10-15 NOTE — ED Notes (Signed)
Sore throat since Monday.  Throat red with exudate on tonsils.

## 2013-10-18 NOTE — ED Provider Notes (Signed)
CSN: [id number]     Arrival date & time 10/15/13  1615 History   First MD Initiated Contact with Patient 10/15/13 1750     Chief Complaint  Patient presents with  . Sore Throat   (Consider location/radiation/quality/duration/timing/severity/associated sxs/prior Treatment) Patient is a 27 y.o. female presenting with pharyngitis. The history is provided by the patient.  Sore Throat This is a new problem. Episode onset: 4 days. The problem occurs constantly. The problem has been unchanged. Associated symptoms include chills, myalgias, a sore throat and swollen glands. Pertinent negatives include no abdominal pain, arthralgias, congestion, coughing, fever, headaches, nausea, neck pain, numbness, rash, urinary symptoms, visual change, vomiting or weakness. The symptoms are aggravated by swallowing. She has tried acetaminophen (OTC medications) for the symptoms. The treatment provided no relief.    History reviewed. No pertinent past medical history. Past Surgical History  Procedure Laterality Date  . Elective abortion     No family history on file. History  Substance Use Topics  . Smoking status: Former Games developer  . Smokeless tobacco: Not on file  . Alcohol Use: No   OB History   Grav Para Term Preterm Abortions TAB SAB Ect Mult Living   1 1 1             Review of Systems  Constitutional: Positive for chills. Negative for fever, activity change and appetite change.  HENT: Positive for sore throat. Negative for congestion, ear pain, facial swelling, trouble swallowing and voice change.   Eyes: Negative for pain and visual disturbance.  Respiratory: Negative for cough and shortness of breath.   Gastrointestinal: Negative for nausea, vomiting and abdominal pain.  Musculoskeletal: Positive for myalgias. Negative for arthralgias, neck pain and neck stiffness.  Skin: Negative for color change and rash.  Neurological: Negative for dizziness, facial asymmetry, speech difficulty, weakness,  numbness and headaches.  Hematological: Negative for adenopathy.  All other systems reviewed and are negative.    Allergies  Review of patient's allergies indicates no known allergies.  Home Medications   Current Outpatient Rx  Name  Route  Sig  Dispense  Refill  . ibuprofen (ADVIL,MOTRIN) 200 MG tablet   Oral   Take 200 mg by mouth every 6 (six) hours as needed for pain.         Marland Kitchen Alum & Mag Hydroxide-Simeth (MAGIC MOUTHWASH W/LIDOCAINE) SOLN   Oral   Take 5 mLs by mouth 3 (three) times daily as needed. Swish and spit, do not swallow   100 mL   0   . amoxicillin (AMOXIL) 500 MG capsule   Oral   Take 1 capsule (500 mg total) by mouth 3 (three) times daily.   21 capsule   0   . ibuprofen (ADVIL,MOTRIN) 800 MG tablet   Oral   Take 1 tablet (800 mg total) by mouth 3 (three) times daily.   21 tablet   0   . medroxyPROGESTERone (DEPO-PROVERA) 150 MG/ML injection   Intramuscular   Inject 1 mL (150 mg total) into the muscle once.   1 mL   3    BP 114/78  Pulse 83  Temp(Src) 98.8 F (37.1 C) (Oral)  Resp 16  Ht 5\' 1"  (1.549 m)  Wt 110 lb (49.896 kg)  BMI 20.8 kg/m2  SpO2 100% Physical Exam  Nursing note and vitals reviewed. Constitutional: She is oriented to person, place, and time. She appears well-developed and well-nourished. No distress.  HENT:  Head: Normocephalic and atraumatic.  Right Ear: Tympanic membrane and  ear canal normal.  Left Ear: Tympanic membrane and ear canal normal.  Mouth/Throat: Uvula is midline and mucous membranes are normal. No trismus in the jaw. No uvula swelling. Oropharyngeal exudate, posterior oropharyngeal edema and posterior oropharyngeal erythema present. No tonsillar abscesses.  Neck: Normal range of motion. Neck supple.  Cardiovascular: Normal rate, regular rhythm and normal heart sounds.   No murmur heard. Pulmonary/Chest: Effort normal and breath sounds normal. No respiratory distress.  Abdominal: She exhibits no  distension. There is no splenomegaly. There is no tenderness. There is no rebound and no guarding.  Musculoskeletal: Normal range of motion.  Lymphadenopathy:    She has no cervical adenopathy.  Neurological: She is alert and oriented to person, place, and time. She exhibits normal muscle tone. Coordination normal.  Skin: Skin is warm and dry.    ED Course  Procedures (including critical care time) Labs Review Labs Reviewed - No data to display Imaging Review No results found.  EKG Interpretation   None       MDM   1. Pharyngitis    Patient's oropharynx is erythematous with enlg tonsils and exudates also present.  Handles secretions well.  No PTA.  She agrees to amoxil, ibuprofen and magic mouthwash.      Daveah Varone L. Trisha Mangle, PA-C 10/18/13 1851

## 2013-10-19 NOTE — ED Provider Notes (Signed)
Medical screening examination/treatment/procedure(s) were performed by non-physician practitioner and as supervising physician I was immediately available for consultation/collaboration.  EKG Interpretation   None         Mathis Cashman L Adhya Cocco, MD 10/19/13 0746 

## 2013-10-26 ENCOUNTER — Ambulatory Visit: Payer: Medicaid Other

## 2013-10-27 ENCOUNTER — Other Ambulatory Visit (INDEPENDENT_AMBULATORY_CARE_PROVIDER_SITE_OTHER): Payer: Medicaid Other

## 2013-10-27 ENCOUNTER — Ambulatory Visit: Payer: Self-pay

## 2013-10-27 DIAGNOSIS — Z3200 Encounter for pregnancy test, result unknown: Secondary | ICD-10-CM

## 2013-10-27 LAB — HCG, QUANTITATIVE, PREGNANCY: hCG, Beta Chain, Quant, S: <1 m[IU]/mL

## 2013-10-28 ENCOUNTER — Ambulatory Visit: Payer: Self-pay

## 2013-10-28 ENCOUNTER — Ambulatory Visit (INDEPENDENT_AMBULATORY_CARE_PROVIDER_SITE_OTHER): Payer: Medicaid Other | Admitting: Adult Health

## 2013-10-28 ENCOUNTER — Encounter: Payer: Self-pay | Admitting: Adult Health

## 2013-10-28 VITALS — BP 100/60 | Ht 61.0 in | Wt 110.5 lb

## 2013-10-28 DIAGNOSIS — Z3049 Encounter for surveillance of other contraceptives: Secondary | ICD-10-CM

## 2013-10-28 DIAGNOSIS — Z309 Encounter for contraceptive management, unspecified: Secondary | ICD-10-CM

## 2013-10-28 DIAGNOSIS — Z3202 Encounter for pregnancy test, result negative: Secondary | ICD-10-CM

## 2013-10-28 MED ORDER — MEDROXYPROGESTERONE ACETATE 150 MG/ML IM SUSP
150.0000 mg | Freq: Once | INTRAMUSCULAR | Status: AC
  Administered 2013-10-28: 150 mg via INTRAMUSCULAR

## 2013-10-28 NOTE — Progress Notes (Signed)
Patient ID: Jaclyn Howe, female   DOB: 06/16/1986, 27 y.o.   MRN: [id number] Pt here today for Depo Provera 150 mg, negative pregnancy test.

## 2014-01-20 ENCOUNTER — Ambulatory Visit (INDEPENDENT_AMBULATORY_CARE_PROVIDER_SITE_OTHER): Payer: Medicaid Other | Admitting: Adult Health

## 2014-01-20 ENCOUNTER — Encounter: Payer: Self-pay | Admitting: Adult Health

## 2014-01-20 VITALS — BP 102/50 | Ht 61.0 in | Wt 112.0 lb

## 2014-01-20 DIAGNOSIS — Z3049 Encounter for surveillance of other contraceptives: Secondary | ICD-10-CM

## 2014-01-20 DIAGNOSIS — Z3202 Encounter for pregnancy test, result negative: Secondary | ICD-10-CM

## 2014-01-20 DIAGNOSIS — Z309 Encounter for contraceptive management, unspecified: Secondary | ICD-10-CM

## 2014-01-20 LAB — POCT URINE PREGNANCY: PREG TEST UR: NEGATIVE

## 2014-01-20 MED ORDER — MEDROXYPROGESTERONE ACETATE 150 MG/ML IM SUSP
150.0000 mg | Freq: Once | INTRAMUSCULAR | Status: AC
  Administered 2014-01-20: 150 mg via INTRAMUSCULAR

## 2014-01-20 NOTE — Progress Notes (Signed)
Patient ID: Jaclyn BeltonShameeka Prada, female   DOB: 01/09/1986, 28 y.o.   MRN: [id number] Pt given Depo Provera 150 mg in left deltoid with no complications, resulted negative pregnancy test. Pt to return in 12 weeks for next injection.

## 2014-04-01 ENCOUNTER — Emergency Department (HOSPITAL_COMMUNITY)
Admission: EM | Admit: 2014-04-01 | Discharge: 2014-04-01 | Disposition: A | Discharge status: Home / Self Care | Payer: Medicaid Other | Attending: Emergency Medicine | Admitting: Emergency Medicine

## 2014-04-01 DIAGNOSIS — Z87891 Personal history of nicotine dependence: Secondary | ICD-10-CM | POA: Insufficient documentation

## 2014-04-01 DIAGNOSIS — R112 Nausea with vomiting, unspecified: Secondary | ICD-10-CM | POA: Insufficient documentation

## 2014-04-01 DIAGNOSIS — R5383 Other fatigue: Secondary | ICD-10-CM

## 2014-04-01 DIAGNOSIS — R42 Dizziness and giddiness: Secondary | ICD-10-CM | POA: Insufficient documentation

## 2014-04-01 DIAGNOSIS — E860 Dehydration: Secondary | ICD-10-CM | POA: Insufficient documentation

## 2014-04-01 DIAGNOSIS — E876 Hypokalemia: Secondary | ICD-10-CM

## 2014-04-01 DIAGNOSIS — R5381 Other malaise: Secondary | ICD-10-CM | POA: Insufficient documentation

## 2014-04-01 LAB — I-STAT CHEM 8, ED
BUN: 10 mg/dL (ref 6–23)
CHLORIDE: 111 meq/L (ref 96–112)
Calcium, Ion: 1.16 mmol/L (ref 1.12–1.23)
Creatinine, Ser: 0.6 mg/dL (ref 0.50–1.10)
Glucose, Bld: 104 mg/dL — ABNORMAL HIGH (ref 70–99)
HEMATOCRIT: 46 % (ref 36.0–46.0)
Hemoglobin: 15.6 g/dL — ABNORMAL HIGH (ref 12.0–15.0)
POTASSIUM: 3.4 meq/L — AB (ref 3.7–5.3)
Sodium: 146 mEq/L (ref 137–147)
TCO2: 21 mmol/L (ref 0–100)

## 2014-04-01 MED ORDER — DIPHENHYDRAMINE HCL 50 MG/ML IJ SOLN
25.0000 mg | Freq: Once | INTRAMUSCULAR | Status: AC
  Administered 2014-04-01: 25 mg via INTRAVENOUS
  Filled 2014-04-01: qty 1

## 2014-04-01 MED ORDER — POTASSIUM CHLORIDE CRYS ER 20 MEQ PO TBCR: 20.0000 meq | EXTENDED_RELEASE_TABLET | Freq: Two times a day (BID) | ORAL | Status: DC

## 2014-04-01 MED ORDER — SODIUM CHLORIDE 0.9 % IV BOLUS (SEPSIS)
1000.0000 mL | Freq: Once | INTRAVENOUS | Status: AC
Start: 2014-04-01 — End: 2014-04-01
  Administered 2014-04-01: 1000 mL via INTRAVENOUS

## 2014-04-01 MED ORDER — SODIUM CHLORIDE 0.9 % IV BOLUS (SEPSIS)
1000.0000 mL | Freq: Once | INTRAVENOUS | Status: AC
  Administered 2014-04-01: 1000 mL via INTRAVENOUS

## 2014-04-01 MED ORDER — ONDANSETRON HCL 4 MG/2ML IJ SOLN
4.0000 mg | Freq: Once | INTRAMUSCULAR | Status: AC
  Administered 2014-04-01: 4 mg via INTRAVENOUS
  Filled 2014-04-01: qty 2

## 2014-04-01 MED ORDER — METOCLOPRAMIDE HCL 5 MG/ML IJ SOLN
10.0000 mg | Freq: Once | INTRAMUSCULAR | Status: AC
  Administered 2014-04-01: 10 mg via INTRAVENOUS
  Filled 2014-04-01: qty 2

## 2014-04-01 MED ORDER — ONDANSETRON 4 MG PO TBDP: 4.0000 mg | ORAL_TABLET | Freq: Three times a day (TID) | ORAL | Status: DC | PRN

## 2014-04-01 NOTE — ED Provider Notes (Signed)
CSN: [id number]     Arrival date & time 04/01/14  4098 History   First MD Initiated Contact with Patient 04/01/14 [phone]     Chief Complaint  Patient presents with  . Emesis     (Consider location/radiation/quality/duration/timing/severity/associated sxs/prior Treatment) HPI Patient reports acutely at 1 AM this morning she started having multiple episodes of nausea and vomiting. She estimates about 7 times. She states she had 2 bowel movements that were normal. She denies any fever. She denies abdominal pain but states now her abdomen is sore from vomiting. She has mild dizziness and lightheadedness and states she does feel weak. She denies seeing any blood in the vomiting. She denies eating anything that she thinks made her ill. She states family members has also been sick.  PCP none  No past medical history on file. Past Surgical History  Procedure Laterality Date  . Elective abortion     No family history on file. History  Substance Use Topics  . Smoking status: Former Games developer  . Smokeless tobacco: Never Used  . Alcohol Use: No  smokes 1 pp2weeks On disability does not know what condition  OB History   Grav Para Term Preterm Abortions TAB SAB Ect Mult Living   1 1 1             Review of Systems  All other systems reviewed and are negative.     Allergies  Review of patient's allergies indicates no known allergies.  Home Medications   Current Outpatient Rx  Name  Route  Sig  Dispense  Refill  . ibuprofen (ADVIL,MOTRIN) 200 MG tablet   Oral   Take 200 mg by mouth every 6 (six) hours as needed for pain.         . medroxyPROGESTERone (DEPO-PROVERA) 150 MG/ML injection   Intramuscular   Inject 1 mL (150 mg total) into the muscle once.   1 mL   3    BP 138/104  Pulse 103  Temp(Src) 98.4 F (36.9 C) (Oral)  Resp 20  Ht 5\' 1"  (1.549 m)  Wt 113 lb (51.256 kg)  BMI 21.36 kg/m2  SpO2 100%  Vital signs normal except for tachycardia  Physical Exam  Nursing  note and vitals reviewed. Constitutional: She is oriented to person, place, and time.  Non-toxic appearance. She does not appear ill. No distress.  Actively vomiting  HENT:  Head: Normocephalic and atraumatic.  Right Ear: External ear normal.  Left Ear: External ear normal.  Nose: Nose normal. No mucosal edema or rhinorrhea.  Mouth/Throat: Mucous membranes are normal. No dental abscesses or uvula swelling.  Dry tongue  Eyes: Conjunctivae and EOM are normal. Pupils are equal, round, and reactive to light.  Neck: Normal range of motion and full passive range of motion without pain. Neck supple.  Cardiovascular: Normal rate, regular rhythm and normal heart sounds.  Exam reveals no gallop and no friction rub.   No murmur heard. Pulmonary/Chest: Effort normal and breath sounds normal. No respiratory distress. She has no wheezes. She has no rhonchi. She has no rales. She exhibits no tenderness and no crepitus.  Abdominal: Soft. Normal appearance and bowel sounds are normal. She exhibits no distension. There is no tenderness. There is no rebound and no guarding.  Musculoskeletal: Normal range of motion. She exhibits no edema and no tenderness.  Moves all extremities well.   Neurological: She is alert and oriented to person, place, and time. She has normal strength. No cranial nerve deficit.  Skin:  Skin is warm, dry and intact. No rash noted. No erythema. No pallor.  Psychiatric: She has a normal mood and affect. Her speech is normal and behavior is normal. Her mood appears not anxious.    ED Course  Procedures (including critical care time)  Medications  sodium chloride 0.9 % bolus 1,000 mL (0 mLs Intravenous Stopped 04/01/14 0932)  metoCLOPramide (REGLAN) injection 10 mg (10 mg Intravenous Given 04/01/14 0748)  diphenhydrAMINE (BENADRYL) injection 25 mg (25 mg Intravenous Given 04/01/14 0748)  sodium chloride 0.9 % bolus 1,000 mL (1,000 mLs Intravenous New Bag/Given 04/01/14 0959)  ondansetron  (ZOFRAN) injection 4 mg (4 mg Intravenous Given 04/01/14 0959)   Pt given IV fluid bolus, IV reglan and benadryl for her nausea and vomtiing.   Recheck 09:55 feeling better, still has some nausea, ready to try ice chips. Given zofran for nausea and second IV bag.   Recheck 11:20 Feels ready to go home, nausea is gone.   Labs Review Results for orders placed during the hospital encounter of 04/01/14  I-STAT CHEM 8, ED      Result Value Ref Range   Sodium 146  137 - 147 mEq/L   Potassium 3.4 (*) 3.7 - 5.3 mEq/L   Chloride 111  96 - 112 mEq/L   BUN 10  6 - 23 mg/dL   Creatinine, Ser 0.450.60  0.50 - 1.10 mg/dL   Glucose, Bld 409104 (*) 70 - 99 mg/dL   Calcium, Ion 8.111.16  9.141.12 - 1.23 mmol/L   TCO2 21  0 - 100 mmol/L   Hemoglobin 15.6 (*) 12.0 - 15.0 g/dL   HCT 78.246.0  95.636.0 - 21.346.0 %   Laboratory interpretation all normal except hypokalemia    Imaging Review No results found.   EKG Interpretation None      MDM   Final diagnoses:  Nausea and vomiting  Dehydration  Hypokalemia    New Prescriptions   ONDANSETRON (ZOFRAN ODT) 4 MG DISINTEGRATING TABLET    Take 1 tablet (4 mg total) by mouth every 8 (eight) hours as needed for nausea or vomiting.   POTASSIUM CHLORIDE SA (K-DUR,KLOR-CON) 20 MEQ TABLET    Take 1 tablet (20 mEq total) by mouth 2 (two) times daily.    Plan discharge   Devoria AlbeIva Keylon Labelle, MD, [facility] DellFACEP     Enrrique Mierzwa L Epic Tribbett, MD 04/01/14 1130

## 2014-04-01 NOTE — ED Notes (Signed)
Pt c/o n/v/d since last night.  Denies abd pain.

## 2014-04-01 NOTE — Discharge Instructions (Signed)
Drink plenty of fluids. Avoid fried, spicy or greasy foods. Use the zofran for nausea or vomiting. Take the potassium pills until gone, your potassium level was low from the vomiting.    Nausea and Vomiting Nausea is a sick feeling that often comes before throwing up (vomiting). Vomiting is a reflex where stomach contents come out of your mouth. Vomiting can cause severe loss of body fluids (dehydration). Children and elderly adults can become dehydrated quickly, especially if they also have diarrhea. Nausea and vomiting are symptoms of a condition or disease. It is important to find the cause of your symptoms. CAUSES   Direct irritation of the stomach lining. This irritation can result from increased acid production (gastroesophageal reflux disease), infection, food poisoning, taking certain medicines (such as nonsteroidal anti-inflammatory drugs), alcohol use, or tobacco use.  Signals from the brain.These signals could be caused by a headache, heat exposure, an inner ear disturbance, increased pressure in the brain from injury, infection, a tumor, or a concussion, pain, emotional stimulus, or metabolic problems.  An obstruction in the gastrointestinal tract (bowel obstruction).  Illnesses such as diabetes, hepatitis, gallbladder problems, appendicitis, kidney problems, cancer, sepsis, atypical symptoms of a heart attack, or eating disorders.  Medical treatments such as chemotherapy and radiation.  Receiving medicine that makes you sleep (general anesthetic) during surgery. DIAGNOSIS Your caregiver may ask for tests to be done if the problems do not improve after a few days. Tests may also be done if symptoms are severe or if the reason for the nausea and vomiting is not clear. Tests may include:  Urine tests.  Blood tests.  Stool tests.  Cultures (to look for evidence of infection).  X-rays or other imaging studies. Test results can help your caregiver make decisions about treatment  or the need for additional tests. TREATMENT You need to stay well hydrated. Drink frequently but in small amounts.You may wish to drink water, sports drinks, clear broth, or eat frozen ice pops or gelatin dessert to help stay hydrated.When you eat, eating slowly may help prevent nausea.There are also some antinausea medicines that may help prevent nausea. HOME CARE INSTRUCTIONS   Take all medicine as directed by your caregiver.  If you do not have an appetite, do not force yourself to eat. However, you must continue to drink fluids.  If you have an appetite, eat a normal diet unless your caregiver tells you differently.  Eat a variety of complex carbohydrates (rice, wheat, potatoes, bread), lean meats, yogurt, fruits, and vegetables.  Avoid high-fat foods because they are more difficult to digest.  Drink enough water and fluids to keep your urine clear or pale yellow.  If you are dehydrated, ask your caregiver for specific rehydration instructions. Signs of dehydration may include:  Severe thirst.  Dry lips and mouth.  Dizziness.  Dark urine.  Decreasing urine frequency and amount.  Confusion.  Rapid breathing or pulse. SEEK IMMEDIATE MEDICAL CARE IF:   You have blood or brown flecks (like coffee grounds) in your vomit.  You have black or bloody stools.  You have a severe headache or stiff neck.  You are confused.  You have severe abdominal pain.  You have chest pain or trouble breathing.  You do not urinate at least once every 8 hours.  You develop cold or clammy skin.  You continue to vomit for longer than 24 to 48 hours.  You have a fever. MAKE SURE YOU:   Understand these instructions.  Will watch your condition.  Will get help right away if you are not doing well or get worse. Document Released: 12/10/2005 Document Revised: 03/03/2012 Document Reviewed: 05/09/2011 [facility] Patient Information 2014 [place], [place].  Hypokalemia Hypokalemia means  that the amount of potassium in the blood is lower than normal.Potassium is a chemical, called an electrolyte, that helps regulate the amount of fluid in the body. It also stimulates muscle contraction and helps nerves function properly.Most of the body's potassium is inside of cells, and only a very small amount is in the blood. Because the amount in the blood is so small, minor changes can be life-threatening. CAUSES  Antibiotics.  Diarrhea or vomiting.  Using laxatives too much, which can cause diarrhea.  Chronic kidney disease.  Water pills (diuretics).  Eating disorders (bulimia).  Low magnesium level.  Sweating a lot. SIGNS AND SYMPTOMS  Weakness.  Constipation.  Fatigue.  Muscle cramps.  Mental confusion.  Skipped heartbeats or irregular heartbeat (palpitations).  Tingling or numbness. DIAGNOSIS  Your health care provider can diagnose hypokalemia with blood tests. In addition to checking your potassium level, your health care provider may also check other lab tests. TREATMENT Hypokalemia can be treated with potassium supplements taken by mouth or adjustments in your current medicines. If your potassium level is very low, you may need to get potassium through a vein (IV) and be monitored in the hospital. A diet high in potassium is also helpful. Foods high in potassium are:  Nuts, such as peanuts and pistachios.  Seeds, such as sunflower seeds and pumpkin seeds.  Peas, lentils, and lima beans.  Whole grain and bran cereals and breads.  Fresh fruit and vegetables, such as apricots, avocado, bananas, cantaloupe, kiwi, oranges, tomatoes, asparagus, and potatoes.  Orange and tomato juices.  Red meats.  Fruit yogurt. HOME CARE INSTRUCTIONS  Take all medicines as prescribed by your health care provider.  Maintain a healthy diet by including nutritious food, such as fruits, vegetables, nuts, whole grains, and lean meats.  If you are taking a laxative, be  sure to follow the directions on the label. SEEK MEDICAL CARE IF:  Your weakness gets worse.  You feel your heart pounding or racing.  You are vomiting or having diarrhea.  You are diabetic and having trouble keeping your blood glucose in the normal range. SEEK IMMEDIATE MEDICAL CARE IF:  You have chest pain, shortness of breath, or dizziness.  You are vomiting or having diarrhea for more than 2 days.  You faint. MAKE SURE YOU:   Understand these instructions.  Will watch your condition.  Will get help right away if you are not doing well or get worse. Document Released: 12/10/2005 Document Revised: 09/30/2013 Document Reviewed: 06/12/2013 [facility]Exit[facility] Patient Information 2014 [place], [place].

## 2014-04-14 ENCOUNTER — Encounter: Payer: Self-pay | Admitting: Adult Health

## 2014-04-14 ENCOUNTER — Ambulatory Visit (INDEPENDENT_AMBULATORY_CARE_PROVIDER_SITE_OTHER): Payer: Medicaid Other | Admitting: Adult Health

## 2014-04-14 VITALS — BP 102/64 | Ht 60.0 in | Wt 118.0 lb

## 2014-04-14 DIAGNOSIS — Z3049 Encounter for surveillance of other contraceptives: Secondary | ICD-10-CM

## 2014-04-14 DIAGNOSIS — Z309 Encounter for contraceptive management, unspecified: Secondary | ICD-10-CM

## 2014-04-14 DIAGNOSIS — Z3202 Encounter for pregnancy test, result negative: Secondary | ICD-10-CM

## 2014-04-14 LAB — POCT URINE PREGNANCY: Preg Test, Ur: NEGATIVE

## 2014-04-14 MED ORDER — MEDROXYPROGESTERONE ACETATE 150 MG/ML IM SUSP
150.0000 mg | Freq: Once | INTRAMUSCULAR | Status: AC
  Administered 2014-04-14: 150 mg via INTRAMUSCULAR

## 2014-04-14 NOTE — Progress Notes (Signed)
Patient ID: Jaclyn MessShemeeka Howe, female   DOB: 07/16/1986, 28 y.o.   MRN: [id number] Depo provera 150 mg IM given in right deltoid with no complication. Negative pregnancy test resulted. Pt to return in 12 weeks for next injection.

## 2014-06-03 ENCOUNTER — Telehealth: Payer: Self-pay | Admitting: *Deleted

## 2014-06-03 NOTE — Telephone Encounter (Signed)
Pt called stating [facility] wanted her to call to make sure it is okay if she starts getting her physicals there. Please advise [phone]

## 2014-06-04 NOTE — Telephone Encounter (Signed)
Called and left message with patient notifying her that she can not be scheduled here unless her medicaid card is current showing Dr. Lodema HongSimpson as provider.  [facility]

## 2014-06-04 NOTE — Telephone Encounter (Signed)
Called and left message for patient to return call.  

## 2014-06-18 ENCOUNTER — Other Ambulatory Visit: Payer: Medicaid Other | Admitting: Adult Health

## 2014-07-07 ENCOUNTER — Telehealth: Payer: Self-pay | Admitting: Family Medicine

## 2014-07-07 ENCOUNTER — Other Ambulatory Visit: Payer: Medicaid Other | Admitting: Adult Health

## 2014-07-07 NOTE — Telephone Encounter (Signed)
Patient no longer seen here.  Medicaid card says Health Department

## 2014-07-07 NOTE — Telephone Encounter (Signed)
Called patient to confirm with her that she is no [facility] being [facility] and that her Medicaid card identifies [facility] as her primary care provider.  She is also being followed by [facility] and could contact them with any medical concerns as well.  Before completing this message, patient called back stating she had received her new Medicaid care which shows [facility] (Dr. Syliva OvermanMargaret Simpson) as her PCP.  Patient will bring copy of new card for scanning and we will schedule her for a FU appt (not seen since Sept 2013).

## 2014-07-08 ENCOUNTER — Ambulatory Visit: Payer: Medicaid Other

## 2014-07-08 ENCOUNTER — Telehealth: Payer: Self-pay | Admitting: *Deleted

## 2014-07-08 ENCOUNTER — Other Ambulatory Visit: Payer: Self-pay | Admitting: Obstetrics & Gynecology

## 2014-07-08 MED ORDER — MEDROXYPROGESTERONE ACETATE 150 MG/ML IM SUSP: 150.0000 mg | INTRAMUSCULAR | Status: DC

## 2014-07-08 NOTE — Telephone Encounter (Signed)
Left message to call, and refilled depo

## 2014-07-08 NOTE — Telephone Encounter (Signed)
Pt requesting refill on Depo provera. Her appt is tomorrow morning for her injection.

## 2014-07-08 NOTE — Telephone Encounter (Signed)
Noted  

## 2014-07-09 ENCOUNTER — Ambulatory Visit: Payer: Medicaid Other

## 2014-07-12 ENCOUNTER — Ambulatory Visit (INDEPENDENT_AMBULATORY_CARE_PROVIDER_SITE_OTHER): Payer: Medicaid Other | Admitting: Adult Health

## 2014-07-12 ENCOUNTER — Encounter: Payer: Self-pay | Admitting: Adult Health

## 2014-07-12 DIAGNOSIS — Z3042 Encounter for surveillance of injectable contraceptive: Secondary | ICD-10-CM

## 2014-07-12 DIAGNOSIS — Z3049 Encounter for surveillance of other contraceptives: Secondary | ICD-10-CM

## 2014-07-12 DIAGNOSIS — Z3202 Encounter for pregnancy test, result negative: Secondary | ICD-10-CM

## 2014-07-12 LAB — POCT URINE PREGNANCY: PREG TEST UR: NEGATIVE

## 2014-07-12 MED ORDER — MEDROXYPROGESTERONE ACETATE 150 MG/ML IM SUSP
150.0000 mg | Freq: Once | INTRAMUSCULAR | Status: AC
  Administered 2014-07-12: 150 mg via INTRAMUSCULAR

## 2014-07-29 ENCOUNTER — Encounter (HOSPITAL_COMMUNITY): Payer: Self-pay | Admitting: Emergency Medicine

## 2014-07-29 ENCOUNTER — Emergency Department (HOSPITAL_COMMUNITY)
Admission: EM | Admit: 2014-07-29 | Discharge: 2014-07-29 | Disposition: A | Discharge status: Home / Self Care | Payer: Medicaid Other | Attending: Emergency Medicine | Admitting: Emergency Medicine

## 2014-07-29 DIAGNOSIS — Z87891 Personal history of nicotine dependence: Secondary | ICD-10-CM | POA: Diagnosis not present

## 2014-07-29 DIAGNOSIS — Z3202 Encounter for pregnancy test, result negative: Secondary | ICD-10-CM | POA: Insufficient documentation

## 2014-07-29 DIAGNOSIS — R071 Chest pain on breathing: Secondary | ICD-10-CM | POA: Diagnosis not present

## 2014-07-29 DIAGNOSIS — M545 Low back pain, unspecified: Secondary | ICD-10-CM | POA: Diagnosis not present

## 2014-07-29 DIAGNOSIS — B9689 Other specified bacterial agents as the cause of diseases classified elsewhere: Secondary | ICD-10-CM | POA: Insufficient documentation

## 2014-07-29 DIAGNOSIS — A499 Bacterial infection, unspecified: Secondary | ICD-10-CM | POA: Diagnosis not present

## 2014-07-29 DIAGNOSIS — R112 Nausea with vomiting, unspecified: Secondary | ICD-10-CM | POA: Insufficient documentation

## 2014-07-29 DIAGNOSIS — N760 Acute vaginitis: Secondary | ICD-10-CM | POA: Insufficient documentation

## 2014-07-29 LAB — CBC WITH DIFFERENTIAL/PLATELET
BASOS PCT: 0 % (ref 0–1)
Basophils Absolute: 0 10*3/uL (ref 0.0–0.1)
EOS PCT: 0 % (ref 0–5)
Eosinophils Absolute: 0 10*3/uL (ref 0.0–0.7)
HCT: 39.4 % (ref 36.0–46.0)
Hemoglobin: 14 g/dL (ref 12.0–15.0)
LYMPHS ABS: 1.8 10*3/uL (ref 0.7–4.0)
Lymphocytes Relative: 18 % (ref 12–46)
MCH: 33.3 pg (ref 26.0–34.0)
MCHC: 35.5 g/dL (ref 30.0–36.0)
MCV: 93.8 fL (ref 78.0–100.0)
Monocytes Absolute: 0.5 10*3/uL (ref 0.1–1.0)
Monocytes Relative: 5 % (ref 3–12)
NEUTROS PCT: 77 % (ref 43–77)
Neutro Abs: 7.7 10*3/uL (ref 1.7–7.7)
PLATELETS: 323 10*3/uL (ref 150–400)
RBC: 4.2 MIL/uL (ref 3.87–5.11)
RDW: 13.5 % (ref 11.5–15.5)
WBC: 10 10*3/uL (ref 4.0–10.5)

## 2014-07-29 LAB — COMPREHENSIVE METABOLIC PANEL
ALBUMIN: 4.3 g/dL (ref 3.5–5.2)
ALK PHOS: 93 U/L (ref 39–117)
ALT: 13 U/L (ref 0–35)
AST: 18 U/L (ref 0–37)
Anion gap: 16 — ABNORMAL HIGH (ref 5–15)
BUN: 9 mg/dL (ref 6–23)
CALCIUM: 9.1 mg/dL (ref 8.4–10.5)
CO2: 22 mEq/L (ref 19–32)
Chloride: 106 mEq/L (ref 96–112)
Creatinine, Ser: 0.66 mg/dL (ref 0.50–1.10)
GFR calc non Af Amer: >90 mL/min (ref >90)
Glucose, Bld: 83 mg/dL (ref 70–99)
POTASSIUM: 3.3 meq/L — AB (ref 3.7–5.3)
SODIUM: 144 meq/L (ref 137–147)
Total Bilirubin: 0.8 mg/dL (ref 0.3–1.2)
Total Protein: 8 g/dL (ref 6.0–8.3)

## 2014-07-29 LAB — URINE MICROSCOPIC-ADD ON

## 2014-07-29 LAB — URINALYSIS, ROUTINE W REFLEX MICROSCOPIC
Glucose, UA: NEGATIVE mg/dL
Hgb urine dipstick: NEGATIVE
KETONES UR: 40 mg/dL — AB
LEUKOCYTES UA: NEGATIVE
NITRITE: NEGATIVE
PROTEIN: 100 mg/dL — AB
Specific Gravity, Urine: 1.025 (ref 1.005–1.030)
Urobilinogen, UA: 1 mg/dL (ref 0.0–1.0)
pH: 7 (ref 5.0–8.0)

## 2014-07-29 LAB — LIPASE, BLOOD: Lipase: 10 U/L — ABNORMAL LOW (ref 11–59)

## 2014-07-29 LAB — WET PREP, GENITAL
TRICH WET PREP: NONE SEEN
WBC WET PREP: NONE SEEN
YEAST WET PREP: NONE SEEN

## 2014-07-29 LAB — PREGNANCY, URINE: Preg Test, Ur: NEGATIVE

## 2014-07-29 MED ORDER — SODIUM CHLORIDE 0.9 % IV BOLUS (SEPSIS)
1000.0000 mL | Freq: Once | INTRAVENOUS | Status: AC
  Administered 2014-07-29: 1000 mL via INTRAVENOUS

## 2014-07-29 MED ORDER — ONDANSETRON HCL 4 MG/2ML IJ SOLN
4.0000 mg | Freq: Once | INTRAMUSCULAR | Status: AC
  Administered 2014-07-29: 4 mg via INTRAVENOUS
  Filled 2014-07-29: qty 2

## 2014-07-29 MED ORDER — ONDANSETRON HCL 4 MG PO TABS: 4.0000 mg | ORAL_TABLET | Freq: Four times a day (QID) | ORAL | Status: DC

## 2014-07-29 MED ORDER — METRONIDAZOLE 500 MG PO TABS: 500.0000 mg | ORAL_TABLET | Freq: Two times a day (BID) | ORAL | Status: DC

## 2014-07-29 NOTE — Discharge Instructions (Signed)

## 2014-07-29 NOTE — ED Provider Notes (Signed)
CSN: [id number]     Arrival date & time 07/29/14  1648 History  This chart was scribed for Glynn Octave, MD by Modena Jansky, ED Scribe. This patient was seen in room APA10/APA10 and the patient's care was started at 6:19 PM.   Chief Complaint  Patient presents with  . Emesis   The history is provided by the patient. No language interpreter was used.   HPI Comments: Jaclyn Howe is a 28 y.o. female who presents to the Emergency Department complaining of constant moderate emesis that started three days ago. She states that she has been vomiting every hour with no blood. She reports her last episode was in the ED waiting room today. She states that she had more than 10 episodes of emesis today. She reports that she can not eat anything. She states that she has had a prior hx of emesis a long time ago and cannot recall the cause. She states that she has being having associated abdominal pain. She reports no medication use. She states that she drinks some alcohol, but not on a daily basis. She reports that she has no allergies to medications. She denies any hx of abdominal surgery. She also denies any diarrhea, fever, dysuria, hematuria, SOB, vaginal discharge, or vaginal bleeding.    Pt also complains of moderate intermittent substernal chest pain that started 3 days ago. She states that the chest pain started before her emesis began. She reports that the episodes of chest pain last about 1-5 minutes. She states that the pain sometimes radiates to her back. She reports that rubbing her chest provides some relief.   History reviewed. No pertinent past medical history. Past Surgical History  Procedure Laterality Date  . Elective abortion     No family history on file. History  Substance Use Topics  . Smoking status: Former Smoker    Types: Cigarettes  . Smokeless tobacco: Never Used  . Alcohol Use: No   OB History   Grav Para Term Preterm Abortions TAB SAB Ect Mult Living   1 1 1              Review of Systems  A complete 10 system review of systems was obtained and all systems are negative except as noted in the HPI and PMH.   Allergies  Review of patient's allergies indicates no known allergies.  Home Medications   Prior to Admission medications   Medication Sig Start Date End Date Taking? Authorizing Provider  medroxyPROGESTERone (DEPO-PROVERA) 150 MG/ML injection Inject 1 mL (150 mg total) into the muscle every 3 (three) months. 07/08/14   Adline Potter, NP  metroNIDAZOLE (FLAGYL) 500 MG tablet Take 1 tablet (500 mg total) by mouth 2 (two) times daily. 07/29/14   Glynn Octave, MD  ondansetron (ZOFRAN) 4 MG tablet Take 1 tablet (4 mg total) by mouth every 6 (six) hours. 07/29/14   Glynn Octave, MD   BP 123/72  Pulse 100  Temp(Src) 98.7 F (37.1 C) (Oral)  Resp 16  Ht 5\' 1"  (1.549 m)  Wt 117 lb (53.071 kg)  BMI 22.12 kg/m2  SpO2 98% Physical Exam  Nursing note and vitals reviewed. Constitutional: She is oriented to person, place, and time. She appears well-developed and well-nourished. No distress.  HENT:  Head: Normocephalic and atraumatic.  Mouth/Throat: Oropharynx is clear and moist. No oropharyngeal exudate.  Eyes: Conjunctivae and EOM are normal. Pupils are equal, round, and reactive to light.  Neck: Normal range of motion. Neck supple.  No  meningismus.  Cardiovascular: Normal rate, regular rhythm, normal heart sounds and intact distal pulses.   No murmur heard. Pulmonary/Chest: Effort normal and breath sounds normal. No respiratory distress. She exhibits tenderness.  Anterior chest wall tenderness.  Abdominal: Soft. There is tenderness. There is no rebound and no guarding.  Suprapubic tenderness. No RLQ tenderness. No pain at McBurney's point  Genitourinary:  Normal external genitalia. White vaginal discharge. No CMT. No adnexal tenderness. Chaperone present  Musculoskeletal: Normal range of motion. She exhibits tenderness. She exhibits no  edema.  Paraspinal lumbar tenderness.   Neurological: She is alert and oriented to person, place, and time. No cranial nerve deficit. She exhibits normal muscle tone. Coordination normal.  No ataxia on finger to nose bilaterally. No pronator drift. 5/5 strength throughout. CN 2-12 intact. Negative Romberg. Equal grip strength. Sensation intact. Gait is normal.   Skin: Skin is warm.  Psychiatric: She has a normal mood and affect. Her behavior is normal.    ED Course  Procedures (including critical care time) DIAGNOSTIC STUDIES: Oxygen Saturation is 98% on RA, normal by my interpretation.    COORDINATION OF CARE: 6:23 PM- Pt advised of plan for treatment which includes medication and labs and pt agrees.  Labs Review Labs Reviewed  WET PREP, GENITAL - Abnormal; Notable for the following:    Clue Cells Wet Prep HPF POC MODERATE (*)    All other components within normal limits  URINALYSIS, ROUTINE W REFLEX MICROSCOPIC - Abnormal; Notable for the following:    Bilirubin Urine SMALL (*)    Ketones, ur 40 (*)    Protein, ur 100 (*)    All other components within normal limits  COMPREHENSIVE METABOLIC PANEL - Abnormal; Notable for the following:    Potassium 3.3 (*)    Anion gap 16 (*)    All other components within normal limits  LIPASE, BLOOD - Abnormal; Notable for the following:    Lipase 10 (*)    All other components within normal limits  URINE MICROSCOPIC-ADD ON - Abnormal; Notable for the following:    Bacteria, UA FEW (*)    All other components within normal limits  GC/CHLAMYDIA PROBE AMP  PREGNANCY, URINE  CBC WITH DIFFERENTIAL    Imaging Review No results found.   EKG Interpretation   Date/Time:  Thursday July 29 2014 19:10:17 EDT Ventricular Rate:  61 PR Interval:  151 QRS Duration: 63 QT Interval:  434 QTC Calculation: 437 R Axis:   56 Text Interpretation:  Sinus rhythm Abnormal Q suggests anterior infarct No  previous ECGs available Confirmed by Manus GunningANCOUR   MD, Gia Lusher [phone]([id number]) on  07/29/2014 7:17:55 PM      MDM   Final diagnoses:  Nausea and vomiting, vomiting of unspecified type  Bacterial vaginosis   3 day history of nausea and vomiting with lower abdominal pain. No fever. No urinary or vaginal symptoms. Also complains of anterior chest pain that lasts for a few minutes at a time and resolves.  Pregnancy test negative. Urinalysis negative. Pelvic exam benign. Labs unremarkable.  Patient tolerating by mouth in ED. Given IV fluids as well. Husband at bedside says patient has had similar episodes after drinking alcohol which she admits to drinking 2 nights ago. Her abdomen is soft without peritoneal signs. We'll treat possible bacterial vaginosis. Follow up with PCP. Return precautions discussed.   BP 102/90  Pulse 101  Temp(Src) 98.7 F (37.1 C) (Oral)  Resp 17  Ht 5\' 1"  (1.549 m)  Wt 117 lb (  53.071 kg)  BMI 22.12 kg/m2  SpO2 100%  I personally performed the services described in this documentation, which was scribed in my presence. The recorded information has been reviewed and is accurate.     Glynn Octave, MD 07/29/14 [phone]

## 2014-07-29 NOTE — ED Notes (Signed)
Pt c/o n/v x 3 days. Denies diarrhea/constipation. lnbm today.

## 2014-07-29 NOTE — ED Notes (Signed)
Pt tolerated 240cc water, no N/V

## 2014-07-31 LAB — GC/CHLAMYDIA PROBE AMP
CT PROBE, AMP APTIMA: NEGATIVE
GC PROBE AMP APTIMA: NEGATIVE

## 2014-08-30 ENCOUNTER — Encounter (HOSPITAL_COMMUNITY): Payer: Self-pay | Admitting: Emergency Medicine

## 2014-08-30 ENCOUNTER — Emergency Department (HOSPITAL_COMMUNITY)
Admission: EM | Admit: 2014-08-30 | Discharge: 2014-08-30 | Disposition: A | Discharge status: Home / Self Care | Payer: Medicaid Other | Attending: Emergency Medicine | Admitting: Emergency Medicine

## 2014-08-30 DIAGNOSIS — G8929 Other chronic pain: Secondary | ICD-10-CM | POA: Diagnosis not present

## 2014-08-30 DIAGNOSIS — Z87891 Personal history of nicotine dependence: Secondary | ICD-10-CM | POA: Diagnosis not present

## 2014-08-30 DIAGNOSIS — R51 Headache: Secondary | ICD-10-CM | POA: Insufficient documentation

## 2014-08-30 DIAGNOSIS — R112 Nausea with vomiting, unspecified: Secondary | ICD-10-CM | POA: Diagnosis not present

## 2014-08-30 DIAGNOSIS — Z79899 Other long term (current) drug therapy: Secondary | ICD-10-CM | POA: Insufficient documentation

## 2014-08-30 HISTORY — DX: Headache: R51

## 2014-08-30 HISTORY — DX: Unspecified abdominal pain: R10.9

## 2014-08-30 HISTORY — DX: Nausea with vomiting, unspecified: R11.2

## 2014-08-30 HISTORY — DX: Other chronic pain: G89.29

## 2014-08-30 HISTORY — DX: Headache, unspecified: R51.9

## 2014-08-30 MED ORDER — DIPHENHYDRAMINE HCL 50 MG/ML IJ SOLN
25.0000 mg | Freq: Once | INTRAMUSCULAR | Status: AC
  Administered 2014-08-30: 25 mg via INTRAMUSCULAR
  Filled 2014-08-30: qty 1

## 2014-08-30 MED ORDER — PROMETHAZINE HCL 25 MG PO TABS: 25.0000 mg | ORAL_TABLET | Freq: Four times a day (QID) | ORAL | Status: DC | PRN

## 2014-08-30 MED ORDER — PROMETHAZINE HCL 25 MG/ML IJ SOLN
25.0000 mg | Freq: Once | INTRAMUSCULAR | Status: AC
  Administered 2014-08-30: 25 mg via INTRAMUSCULAR
  Filled 2014-08-30: qty 1

## 2014-08-30 MED ORDER — DIPHENHYDRAMINE HCL 50 MG/ML IJ SOLN: 50.0000 mg | Freq: Once | INTRAMUSCULAR | Status: DC

## 2014-08-30 MED ORDER — KETOROLAC TROMETHAMINE 60 MG/2ML IM SOLN
60.0000 mg | Freq: Once | INTRAMUSCULAR | Status: AC
  Administered 2014-08-30: 60 mg via INTRAMUSCULAR
  Filled 2014-08-30: qty 2

## 2014-08-30 NOTE — Discharge Instructions (Signed)
°Emergency Department Resource Guide °1) Find a Doctor and Pay Out of Pocket °Although you won't have to find out who is covered by your insurance plan, it is a good idea to ask around and get recommendations. You will then need to call the office and see if the doctor you have chosen will accept you as a new patient and what types of options they offer for patients who are self-pay. Some doctors offer discounts or will set up payment plans for their patients who do not have insurance, but you will need to ask so you aren't surprised when you get to your appointment. ° °2) Contact Your Local Health Department °Not all health departments have doctors that can see patients for sick visits, but many do, so it is worth a call to see if yours does. If you don't know where your local health department is, you can check in your phone book. The CDC also has a tool to help you locate your state's health department, and many state websites also have listings of all of their local health departments. ° °3) Find a Walk-in Clinic °If your illness is not likely to be very severe or complicated, you may want to try a walk in clinic. These are popping up all over the country in pharmacies, drugstores, and shopping centers. They're usually staffed by nurse practitioners or physician assistants that have been trained to treat common illnesses and complaints. They're usually fairly quick and inexpensive. However, if you have serious medical issues or chronic medical problems, these are probably not your best option. ° °No Primary Care Doctor: °- Call Health Connect at  832-8000 - they can help you locate a primary care doctor that  accepts your insurance, provides certain services, etc. °- Physician Referral Service- 1-800-533-3463 ° °Chronic Pain Problems: °Organization         Address  Phone   Notes  °[facility]  ([phone]) [phone] Patients need to be referred by their primary care doctor.  ° °Medication  Assistance: °Organization         Address  Phone   Notes  °[place] [facility] [address] °[place], [place] [address] (336) 641-8030 --Must be a resident of [place] °-- Must have NO insurance coverage whatsoever (no Medicaid/ Medicare, etc.) °-- The pt. MUST have a primary care doctor that directs their care regularly and follows them in the community °  °[facility]  (866) 331-1348   °[employer]  (888) 892-1162   ° °Agencies that provide inexpensive medical care: °Organization         Address  Phone   Notes  °[facility]  (336) 832-8035   °[facility]    (336) 832-7272   °[facility] [address] °[place], [place] [address] (336) 832-4777   °[facility] [address], °[place] (336) 271-4999   °[facility]    (336) 373-0678   °[facility]    (336) 272-1050   °[facility] ° [address], [place] Phone:  (336) 832-4444, Fax:  (336) 832-4440 Hours of Operation:  9 am - 6 pm, M-F.  Also accepts Medicaid/Medicare and self-pay.  °[facility] ° [address], [place] Phone: (336) [phone], Fax: (336) [phone]. Hours of Operation:  8:30 am - 5:30 pm, M-F.  Also accepts Medicaid and self-pay.  °[facility] [place] [address], [place] Phone: (336) 878-6027   °[facility] [address], [place], [place] (336)723-1848, Ext. 123 Mondays & Thursdays: 7-9 AM.  First 15 patients are seen on a first come, first serve basis. °  ° °Medicaid-accepting [place] Providers: ° °Organization         Address  Phone   Notes  °[facility] [address], [place] (336) 641-2100 Also accepts self-pay patients.  °[facility] [address], [place] ° (336) 856-9996   °[facility] [address], [place]  (336) 288-8857   °[facility] [address] [place] [address], [place] (336) 299-7000   °Veita Bland [address], [place]  ° (336) 373-1557 Only accepts [place] Access Medicaid patients after they have their name applied to their card.  ° °Self-Pay (no insurance) in [place]: ° °Organization         Address  Phone   Notes  °Sickle Cell Patients, [facility] [address], [place] (336) 832-1970   °[facility] [address], [place] (336) 832-4400   °[facility] ° [address], [place] (336) 992-4800   °[facility]/Dr. Osei-Bonsu ° [address] [place] [address], [place] or [address], [place] (336) [phone] Phone number for both [place] and [place] locations is the same.  °[facility] [address], [place] (336) 299-0000   °[facility] [address] [place] [address], [place] or [address] (336) 852-7530 °(336) 878-2260   °[facility] [address], [place] (336) 350-1642, phone; (336) 294-5005, fax Sees patients 1st and 3rd Saturday of every month.  Must not qualify for public or private insurance (i.e. Medicaid, Medicare, [place] Health Choice, Veterans' Benefits) • Household income should be no more than 200% of the poverty level •The clinic cannot treat you if you are pregnant or think you are pregnant • Sexually transmitted diseases are not treated at the clinic.  ° ° °Dental Care: °Organization         Address  Phone  Notes  °[place] [facility] [address], [place] (336) 641-6152 Accepts children up to age 21 who are enrolled in Medicaid or [place] Health Choice; pregnant women with a Medicaid card; and children who have applied for Medicaid or [place] Health Choice, but were declined, whose parents can pay a reduced fee at time of service.  °[place]  [facility] [place]  [address], [place] (336) 641-7733 Accepts children up to age 21 who are enrolled in Medicaid or [place] Health Choice; pregnant women with a Medicaid card; and children who have applied for Medicaid or [place] Health Choice, but were declined, whose parents can pay a reduced fee at time of service.  °[facility] ° [address], [place] (336) 641-4533 Patients are seen by appointment only. Walk-ins are not accepted. [facility] will see patients 18 years of age and older. °Monday - Tuesday (8am-5pm) °Most Wednesdays (8:30-5pm) °$30 per visit, cash only  °[facility] ° [address], [place] (336) 641-4533 Patients are seen by appointment only. Walk-ins are not accepted. [facility] will see patients 18 years of age and older. °One   Wednesday Evening (Monthly: Volunteer Based).  $30 per visit, cash only  °[facility]  (919) 537-3737 for adults; Children under age 4, call Graduate Pediatric Dentistry at ([phone]) [phone]. Children aged 4-14, please call ([phone]) [phone] to request a pediatric application. ° Dental services are provided in all areas of dental care including fillings, crowns and bridges, complete and partial dentures, implants, gum treatment, root canals, and extractions. Preventive care is also provided. Treatment is provided to both adults and children. °Patients are selected via a lottery and there is often a waiting list. °  °[facility] [address], °[place] ° (336) 763-8833 [email or web address] °  °[facility] [address], [place], [place] (336)723-1848, Ext. 123 Second and Fourth Thursday of each month, opens at 6:30 AM; Clinic ends at 9 AM.  Patients are seen on a first-come first-served basis, and a limited number are seen during each clinic.  ° °[facility] ° [address], [place], [place] (336) [phone]    Eligibility Requirements °You must have lived in [place], [place], or [place] counties for at least the last three months. °  You cannot be eligible for state or federal sponsored healthcare insurance, including Veterans Administration, Medicaid, or Medicare. °  You generally cannot be eligible for healthcare insurance through your employer.  °  How to apply: °Eligibility screenings are held every Tuesday and Wednesday afternoon from 1:00 pm until 4:00 pm. You do not need an appointment for the interview!  °[facility] [address], [place], [place] 336-631-2330   °[facility]  336-342-8273   °[facility]  336-703-3100   °[facility]  336-570-6415   ° °Behavioral Health Resources in the Community: °Intensive Outpatient Programs °Organization         Address  Phone  Notes  °[facility] [address], [place], [place] 336-878-6098   °[facility] [address], [place], [place] 336-832-9800   °[facility] [address], [place], [place] ° 336-882-2125   °[facility] [address],  °[place], [place] 1-800-853-5163 or 336-641-4981   °Substance Abuse Resources °Organization         Address  Phone  Notes  °[facility]  336-882-2125   °[facility]  336-784-9470   °[facility]  336-285-9073   °[facility]  336-845-3988   °Residential & Outpatient Substance Abuse Program  1-800-659-3381   °Psychological Services °Organization         Address  Phone  Notes  °[facility]  336- 832-9600   °[facility]  336- 378-7881   °[facility] [address], [place] 1-800-853-5163 or 336-641-4981   ° °Mobile Crisis Teams °Organization         Address  Phone  Notes  °[facility], Mobile Crisis Care Unit  1-877-626-1772   °Assertive °[facility] ° [address]  [place], [place] 336-834-9664   °Sharon DeEsch [address] °[place] 336-554-5454   ° °Self-Help/Support Groups °Organization         Address  Phone             Notes  °[facility]  - variety of support groups  336- 373-1402 Call for more information  °Narcotics Anonymous (NA), [facility] [address], °[place]  2 meetings at this location  ° °  Residential Treatment Programs Organization         Address  Phone  Notes  [facility] [address],    [place]  7-829-562-1308   [facility]  [address], [place] [address], [place], [place] [phone]   [facility] [address] [place], [place] 244-010-2725 Admissions: 8am-3pm M-F  [facility] [address],    [place], [place] 366-440-3474   [facility] [address] [place], [place], [place] 259-563-8756   [facility] [address],  [place], [place] 433-295-1884   [facility] - Intensive Outpatient [address], [place], [place] 166-063-0160   [facility] ([facility] [address] [place]  [place], [place] 1-093-235-5732 or 619 310 4497   [facility] ([facility] [address], [place], [place] 376-283-1517 Accepts Medicaid  [facility] [address],  [place] 6-160-737-1062 Substance Abuse/Addiction Treatment   [facility]         Address  Phone  Notes  [facility]  5705726498   Angie Fava, PhD [address] Kn[address] [place], [place]   [phone] or (819)712-6223   [facility]   [address] [place], [place] 601-837-0962   [facility] [address] [place], [place] (551) [phone] Insurance/Medicaid/sponsorship through [facility] Families [address]                                    [place], [place] (450)184-4857 Therapy/tele-psych/case    [facility] [address], [place] (972) 733-5580    Dr. Lolly Mustache  (620)750-0321   [facility]  [employer] [facility]. 1) [address] [place] 2) [address] [place] 3)  [address], [place] [phone] (213)[phone]  450-447-6491   [facility] Child Abuse Hotline (507)524-4429 or 507-846-0104 (After Hours)       Take over the counter tylenol and ibuprofen (OR excedrin), and benadryl, as directed on packaging, with the prescription given to you today, as needed for headache.  Keep a headache diary.  Call your regular medical doctor tomorrow morning to schedule a follow up appointment within the next 3 days.  Return to the Emergency Department immediately sooner if worsening.

## 2014-08-30 NOTE — ED Notes (Addendum)
Pt c/o of a sudden migraine headache worsened by light and noise. Pt states it began last night, but "got worse this morning." Pt states she is experiencing n/v. Last time she vomited was this morning. Pt denies injury.

## 2014-08-30 NOTE — ED Provider Notes (Signed)
CSN: [id number]     Arrival date & time 08/30/14  0719 History   First MD Initiated Contact with Patient 08/30/14 [phone]     Chief Complaint  Patient presents with  . Migraine      HPI Pt was seen at 0755. Per pt, c/o gradual onset and persistence of constant acute flair of her chronic migraine headache since last night. Has been associated with several episodes of N/V. Describes the headache as located in her forehead, and per her usual chronic migraine headache pain pattern for many years. Pt has not taken any medications for her symptoms. Denies headache was sudden or maximal in onset or at any time.  Denies visual changes, no focal motor weakness, no tingling/numbness in extremities, no fevers, no neck pain, no rash.     Past Medical History  Diagnosis Date  . Chronic headache   . Chronic abdominal pain   . Nausea and vomiting     recurrent   Past Surgical History  Procedure Laterality Date  . Elective abortion      History  Substance Use Topics  . Smoking status: Former Smoker    Types: Cigarettes  . Smokeless tobacco: Never Used  . Alcohol Use: No   OB History   Grav Para Term Preterm Abortions TAB SAB Ect Mult Living   Review of Systems ROS: Statement: All systems negative except as marked or noted in the HPI; Constitutional: Negative for fever and chills. ; ; Eyes: Negative for eye pain, redness and discharge. ; ; ENMT: Negative for ear pain, hoarseness, nasal congestion, sinus pressure and sore throat. ; ; Cardiovascular: Negative for chest pain, palpitations, diaphoresis, dyspnea and peripheral edema. ; ; Respiratory: Negative for cough, wheezing and stridor. ; ; Gastrointestinal: +N/V. Negative for diarrhea, abdominal pain, blood in stool, hematemesis, jaundice and rectal bleeding. . ; ; Genitourinary: Negative for dysuria, flank pain and hematuria. ; ; Musculoskeletal: Negative for back pain and neck pain. Negative for swelling and trauma.; ; Skin:  Negative for pruritus, rash, abrasions, blisters, bruising and skin lesion.; ; Neuro: +migraine headache. Negative for lightheadedness and neck stiffness. Negative for weakness, altered level of consciousness , altered mental status, extremity weakness, paresthesias, involuntary movement, seizure and syncope.      Allergies  Review of patient's allergies indicates no known allergies.  Home Medications   Prior to Admission medications   Medication Sig Start Date End Date Taking? Authorizing Provider  medroxyPROGESTERone (DEPO-PROVERA) 150 MG/ML injection Inject 1 mL (150 mg total) into the muscle every 3 (three) months. 07/08/14   Adline Potter, NP  metroNIDAZOLE (FLAGYL) 500 MG tablet Take 1 tablet (500 mg total) by mouth 2 (two) times daily. 07/29/14   Glynn Octave, MD  ondansetron (ZOFRAN) 4 MG tablet Take 1 tablet (4 mg total) by mouth every 6 (six) hours. 07/29/14   Glynn Octave, MD   BP 108/68  Pulse 94  Temp(Src) 98.5 F (36.9 C) (Oral)  Resp 16  Ht  (1.549 m)  Wt 125 lb (56.7 kg)  BMI 23.63 kg/m2  SpO2 99% Physical Exam 0800: Physical examination:  Nursing notes reviewed; Vital signs and O2 SAT reviewed;  Constitutional: Well developed, Well nourished, Well hydrated, In no acute distress; Head:  Normocephalic, atraumatic; Eyes: EOMI, PERRL, No scleral icterus; ENMT: Mouth and pharynx normal, Mucous membranes moist; Neck: Supple, Full range of motion, No lymphadenopathy; Cardiovascular: Regular rate and rhythm,  No murmur, rub, or gallop; Respiratory: Breath sounds clear & equal bilaterally, No rales, rhonchi, wheezes.  Speaking full sentences with ease, Normal respiratory effort/excursion; Chest: Nontender, Movement normal; Abdomen: Soft, Nontender, Nondistended, Normal bowel sounds; Genitourinary: No CVA tenderness; Extremities: Pulses normal, No tenderness, No edema, No calf edema or asymmetry.; Neuro: AA&Ox3, Major CN grossly intact.  Speech clear. No gross focal motor  or sensory deficits in extremities. Climbs on and off stretcher easily by herself. Gait steady.; Skin: Color normal, Warm, Dry.   ED Course  Procedures     EKG Interpretation None      MDM  MDM Reviewed: previous chart, nursing note and vitals    0915:  Feels better after meds and wants to go home now. Dx d/w pt.  Questions answered.  Verb understanding, agreeable to d/c home with outpt f/u.   Samuel Jester, DO 08/31/14 1746

## 2014-08-30 NOTE — ED Notes (Signed)
Told patient that I could not give her medication until she had a ride home. Patient called friend to come and get her while I was standing at bedside and before medication was administered.

## 2014-09-30 ENCOUNTER — Ambulatory Visit: Payer: Medicaid Other | Admitting: Family Medicine

## 2014-10-04 ENCOUNTER — Ambulatory Visit: Payer: Medicaid Other

## 2014-10-08 ENCOUNTER — Encounter: Payer: Self-pay | Admitting: Adult Health

## 2014-10-08 ENCOUNTER — Ambulatory Visit (INDEPENDENT_AMBULATORY_CARE_PROVIDER_SITE_OTHER): Payer: Medicaid Other | Admitting: Adult Health

## 2014-10-08 DIAGNOSIS — Z3042 Encounter for surveillance of injectable contraceptive: Secondary | ICD-10-CM

## 2014-10-08 DIAGNOSIS — Z3202 Encounter for pregnancy test, result negative: Secondary | ICD-10-CM

## 2014-10-08 LAB — POCT URINE PREGNANCY: Preg Test, Ur: NEGATIVE

## 2014-10-08 MED ORDER — MEDROXYPROGESTERONE ACETATE 150 MG/ML IM SUSP
150.0000 mg | Freq: Once | INTRAMUSCULAR | Status: AC
  Administered 2014-10-08: 150 mg via INTRAMUSCULAR

## 2014-10-25 ENCOUNTER — Encounter: Payer: Self-pay | Admitting: Adult Health

## 2014-12-30 ENCOUNTER — Emergency Department (HOSPITAL_COMMUNITY)
Admission: EM | Admit: 2014-12-30 | Discharge: 2014-12-30 | Disposition: A | Discharge status: Home / Self Care | Payer: Medicaid Other | Attending: Emergency Medicine | Admitting: Emergency Medicine

## 2014-12-30 ENCOUNTER — Encounter (HOSPITAL_COMMUNITY): Payer: Self-pay | Admitting: Emergency Medicine

## 2014-12-30 DIAGNOSIS — Z79899 Other long term (current) drug therapy: Secondary | ICD-10-CM | POA: Diagnosis not present

## 2014-12-30 DIAGNOSIS — R112 Nausea with vomiting, unspecified: Secondary | ICD-10-CM | POA: Insufficient documentation

## 2014-12-30 DIAGNOSIS — Z792 Long term (current) use of antibiotics: Secondary | ICD-10-CM | POA: Insufficient documentation

## 2014-12-30 DIAGNOSIS — Z87891 Personal history of nicotine dependence: Secondary | ICD-10-CM | POA: Diagnosis not present

## 2014-12-30 DIAGNOSIS — G8929 Other chronic pain: Secondary | ICD-10-CM | POA: Diagnosis not present

## 2014-12-30 DIAGNOSIS — Z3202 Encounter for pregnancy test, result negative: Secondary | ICD-10-CM | POA: Insufficient documentation

## 2014-12-30 DIAGNOSIS — R1084 Generalized abdominal pain: Secondary | ICD-10-CM | POA: Insufficient documentation

## 2014-12-30 LAB — CBC WITH DIFFERENTIAL/PLATELET
BASOS ABS: 0 10*3/uL (ref 0.0–0.1)
Basophils Relative: 0 % (ref 0–1)
Eosinophils Absolute: 0 10*3/uL (ref 0.0–0.7)
Eosinophils Relative: 0 % (ref 0–5)
HEMATOCRIT: 44.8 % (ref 36.0–46.0)
Hemoglobin: 15.3 g/dL — ABNORMAL HIGH (ref 12.0–15.0)
Lymphocytes Relative: 11 % — ABNORMAL LOW (ref 12–46)
Lymphs Abs: 1.3 10*3/uL (ref 0.7–4.0)
MCH: 32.4 pg (ref 26.0–34.0)
MCHC: 34.2 g/dL (ref 30.0–36.0)
MCV: 94.9 fL (ref 78.0–100.0)
Monocytes Absolute: 0.3 10*3/uL (ref 0.1–1.0)
Monocytes Relative: 3 % (ref 3–12)
NEUTROS PCT: 86 % — AB (ref 43–77)
Neutro Abs: 10.1 10*3/uL — ABNORMAL HIGH (ref 1.7–7.7)
PLATELETS: 339 10*3/uL (ref 150–400)
RBC: 4.72 MIL/uL (ref 3.87–5.11)
RDW: 13.7 % (ref 11.5–15.5)
WBC: 11.7 10*3/uL — ABNORMAL HIGH (ref 4.0–10.5)

## 2014-12-30 LAB — COMPREHENSIVE METABOLIC PANEL
ALT: 41 U/L — AB (ref 0–35)
AST: 27 U/L (ref 0–37)
Albumin: 4.9 g/dL (ref 3.5–5.2)
Alkaline Phosphatase: 90 U/L (ref 39–117)
Anion gap: 10 (ref 5–15)
BILIRUBIN TOTAL: 0.7 mg/dL (ref 0.3–1.2)
BUN: 11 mg/dL (ref 6–23)
CO2: 22 mmol/L (ref 19–32)
Calcium: 9.4 mg/dL (ref 8.4–10.5)
Chloride: 109 mEq/L (ref 96–112)
Creatinine, Ser: 0.67 mg/dL (ref 0.50–1.10)
GFR calc Af Amer: >90 mL/min (ref >90)
GFR calc non Af Amer: >90 mL/min (ref >90)
GLUCOSE: 114 mg/dL — AB (ref 70–99)
Potassium: 3.9 mmol/L (ref 3.5–5.1)
SODIUM: 141 mmol/L (ref 135–145)
Total Protein: 8.6 g/dL — ABNORMAL HIGH (ref 6.0–8.3)

## 2014-12-30 LAB — URINALYSIS, ROUTINE W REFLEX MICROSCOPIC
BILIRUBIN URINE: NEGATIVE
Glucose, UA: NEGATIVE mg/dL
HGB URINE DIPSTICK: NEGATIVE
Leukocytes, UA: NEGATIVE
Nitrite: NEGATIVE
PH: 6.5 (ref 5.0–8.0)
Protein, ur: NEGATIVE mg/dL
Specific Gravity, Urine: >1.03 — ABNORMAL HIGH (ref 1.005–1.030)
Urobilinogen, UA: 0.2 mg/dL (ref 0.0–1.0)

## 2014-12-30 LAB — LIPASE, BLOOD: Lipase: 19 U/L (ref 11–59)

## 2014-12-30 LAB — PREGNANCY, URINE: PREG TEST UR: NEGATIVE

## 2014-12-30 MED ORDER — SODIUM CHLORIDE 0.9 % IV BOLUS (SEPSIS)
1000.0000 mL | Freq: Once | INTRAVENOUS | Status: AC
  Administered 2014-12-30: 1000 mL via INTRAVENOUS

## 2014-12-30 MED ORDER — ONDANSETRON HCL 4 MG/2ML IJ SOLN
4.0000 mg | Freq: Once | INTRAMUSCULAR | Status: AC
  Administered 2014-12-30: 4 mg via INTRAVENOUS
  Filled 2014-12-30: qty 2

## 2014-12-30 MED ORDER — ONDANSETRON HCL 4 MG PO TABS: 4.0000 mg | ORAL_TABLET | Freq: Four times a day (QID) | ORAL | Status: DC

## 2014-12-30 NOTE — ED Provider Notes (Signed)
CSN: [id number]     Arrival date & time 12/30/14  0159 History   First MD Initiated Contact with Patient 12/30/14 [phone]     Chief Complaint  Patient presents with  . Emesis     (Consider location/radiation/quality/duration/timing/severity/associated sxs/prior Treatment) HPI Patient states she's been seen multiple times for recurrent nausea and vomiting. She presents with greater than 24 hours of vomiting. She's also had ongoing abdominal pain. The abdominal pain is generalized. There is no diarrhea or constipation. Patient denies any fever or chills. She's taken no over-the-counter medication or prescription medication for her symptoms. Denies any urinary or vaginal complaints. Past Medical History  Diagnosis Date  . Chronic headache   . Chronic abdominal pain   . Nausea and vomiting     recurrent   Past Surgical History  Procedure Laterality Date  . Elective abortion     No family history on file. History  Substance Use Topics  . Smoking status: Former Smoker    Types: Cigarettes  . Smokeless tobacco: Never Used  . Alcohol Use: No   OB History    Gravida Para Term Preterm AB TAB SAB Ectopic Multiple Living   Review of Systems  Constitutional: Negative for fever and chills.  Respiratory: Negative for cough and shortness of breath.   Cardiovascular: Negative for chest pain.  Gastrointestinal: Positive for nausea, vomiting and abdominal pain. Negative for diarrhea, constipation and blood in stool.  Genitourinary: Negative for dysuria, frequency, flank pain, vaginal bleeding, vaginal discharge and pelvic pain.  Musculoskeletal: Negative for myalgias, back pain, neck pain and neck stiffness.  Skin: Negative for rash and wound.  Neurological: Negative for dizziness, weakness, light-headedness, numbness and headaches.  All other systems reviewed and are negative.     Allergies  Review of patient's allergies indicates no known allergies.  Home Medications    Prior to Admission medications   Medication Sig Start Date End Date Taking? Authorizing Provider  medroxyPROGESTERone (DEPO-PROVERA) 150 MG/ML injection Inject 1 mL (150 mg total) into the muscle every 3 (three) months. 07/08/14  Yes Adline Potter, NP  metroNIDAZOLE (FLAGYL) 500 MG tablet Take 1 tablet (500 mg total) by mouth 2 (two) times daily. 07/29/14   Glynn Octave, MD  ondansetron (ZOFRAN) 4 MG tablet Take 1 tablet (4 mg total) by mouth every 6 (six) hours. 12/30/14   Loren Racer, MD  promethazine (PHENERGAN) 25 MG tablet Take 1 tablet (25 mg total) by mouth every 6 (six) hours as needed for nausea or vomiting (or headache). 08/30/14   Samuel Jester, DO   BP 143/98 mmHg  Pulse 135  Temp(Src) 98.1 F (36.7 C) (Oral)  Resp 18  Ht  (1.549 m)  Wt 125 lb (56.7 kg)  BMI 23.63 kg/m2  SpO2 98% Physical Exam  Constitutional: She is oriented to person, place, and time. She appears well-developed and well-nourished. No distress.  No distress. No active vomiting.  HENT:  Head: Normocephalic and atraumatic.  Mouth/Throat: Oropharynx is clear and moist.  Eyes: EOM are normal. Pupils are equal, round, and reactive to light.  Neck: Normal range of motion. Neck supple.  Cardiovascular: Normal rate and regular rhythm.   Pulmonary/Chest: Effort normal and breath sounds normal. No respiratory distress. She has no wheezes. She has no rales.  Abdominal: Soft. Bowel sounds are normal. She exhibits no distension and no mass. There is tenderness (diffuse abdominal tenderness without focality. No rebound  or guarding.). There is no rebound and no guarding.  Musculoskeletal: Normal range of motion. She exhibits no edema or tenderness.  No CVA tenderness bilaterally.  Neurological: She is alert and oriented to person, place, and time.  Moves all extremities without deficit. Sensation is grossly intact.  Skin: Skin is warm and dry. No rash noted. No erythema.  Psychiatric: She has a normal  mood and affect. Her behavior is normal.  Nursing note and vitals reviewed.   ED Course  Procedures (including critical care time) Labs Review Labs Reviewed  CBC WITH DIFFERENTIAL - Abnormal; Notable for the following:    WBC 11.7 (*)    Hemoglobin 15.3 (*)    Neutrophils Relative % 86 (*)    Neutro Abs 10.1 (*)    Lymphocytes Relative 11 (*)    All other components within normal limits  COMPREHENSIVE METABOLIC PANEL - Abnormal; Notable for the following:    Glucose, Bld 114 (*)    Total Protein 8.6 (*)    ALT 41 (*)    All other components within normal limits  URINALYSIS, ROUTINE W REFLEX MICROSCOPIC - Abnormal; Notable for the following:    Specific Gravity, Urine >1.030 (*)    Ketones, ur TRACE (*)    All other components within normal limits  LIPASE, BLOOD  PREGNANCY, URINE    Imaging Review No results found.   EKG Interpretation None      MDM   Final diagnoses:  Non-intractable vomiting with nausea, vomiting of unspecified type  Generalized abdominal pain   She is feeling better after IV fluids. No further vomiting in the emergency department. Tolerating PO challenge. Abdomen is soft. Do not believe imaging is necessary at this point. Return precautions given.     Loren Raceravid Dejion Grillo, MD 12/30/14 [phone]

## 2014-12-30 NOTE — ED Notes (Signed)
Patient c/o vomiting x 1 day.  Also c/o abdominal pain.

## 2014-12-30 NOTE — Discharge Instructions (Signed)
Abdominal Pain, Women °Abdominal (stomach, pelvic, or belly) pain can be caused by many things. It is important to tell your doctor: °· The location of the pain. °· Does it come and go or is it present all the time? °· Are there things that start the pain (eating certain foods, exercise)? °· Are there other symptoms associated with the pain (fever, nausea, vomiting, diarrhea)? °All of this is helpful to know when trying to find the cause of the pain. °CAUSES  °· Stomach: virus or bacteria infection, or ulcer. °· Intestine: appendicitis (inflamed appendix), regional ileitis (Crohn's disease), ulcerative colitis (inflamed colon), irritable bowel syndrome, diverticulitis (inflamed diverticulum of the colon), or cancer of the stomach or intestine. °· Gallbladder disease or stones in the gallbladder. °· Kidney disease, kidney stones, or infection. °· Pancreas infection or cancer. °· Fibromyalgia (pain disorder). °· Diseases of the female organs: °· Uterus: fibroid (non-cancerous) tumors or infection. °· Fallopian tubes: infection or tubal pregnancy. °· Ovary: cysts or tumors. °· Pelvic adhesions (scar tissue). °· Endometriosis (uterus lining tissue growing in the pelvis and on the pelvic organs). °· Pelvic congestion syndrome (female organs filling up with blood just before the menstrual period). °· Pain with the menstrual period. °· Pain with ovulation (producing an egg). °· Pain with an IUD (intrauterine device, birth control) in the uterus. °· Cancer of the female organs. °· Functional pain (pain not caused by a disease, may improve without treatment). °· Psychological pain. °· Depression. °DIAGNOSIS  °Your doctor will decide the seriousness of your pain by doing an examination. °· Blood tests. °· X-rays. °· Ultrasound. °· CT scan (computed tomography, special type of X-ray). °· MRI (magnetic resonance imaging). °· Cultures, for infection. °· Barium enema (dye inserted in the large intestine, to better view it with  X-rays). °· Colonoscopy (looking in intestine with a lighted tube). °· Laparoscopy (minor surgery, looking in abdomen with a lighted tube). °· Major abdominal exploratory surgery (looking in abdomen with a large incision). °TREATMENT  °The treatment will depend on the cause of the pain.  °· Many cases can be observed and treated at home. °· Over-the-counter medicines recommended by your caregiver. °· Prescription medicine. °· Antibiotics, for infection. °· Birth control pills, for painful periods or for ovulation pain. °· Hormone treatment, for endometriosis. °· Nerve blocking injections. °· Physical therapy. °· Antidepressants. °· Counseling with a psychologist or psychiatrist. °· Minor or major surgery. °HOME CARE INSTRUCTIONS  °· Do not take laxatives, unless directed by your caregiver. °· Take over-the-counter pain medicine only if ordered by your caregiver. Do not take aspirin because it can cause an upset stomach or bleeding. °· Try a clear liquid diet (broth or water) as ordered by your caregiver. Slowly move to a bland diet, as tolerated, if the pain is related to the stomach or intestine. °· Have a thermometer and take your temperature several times a day, and record it. °· Bed rest and sleep, if it helps the pain. °· Avoid sexual intercourse, if it causes pain. °· Avoid stressful situations. °· Keep your follow-up appointments and tests, as your caregiver orders. °· If the pain does not go away with medicine or surgery, you may try: °· Acupuncture. °· Relaxation exercises (yoga, meditation). °· Group therapy. °· Counseling. °SEEK MEDICAL CARE IF:  °· You notice certain foods cause stomach pain. °· Your home care treatment is not helping your pain. °· You need stronger pain medicine. °· You want your IUD removed. °· You feel faint or   lightheaded. °· You develop nausea and vomiting. °· You develop a rash. °· You are having side effects or an allergy to your medicine. °SEEK IMMEDIATE MEDICAL CARE IF:  °· Your  pain does not go away or gets worse. °· You have a fever. °· Your pain is felt only in portions of the abdomen. The right side could possibly be appendicitis. The left lower portion of the abdomen could be colitis or diverticulitis. °· You are passing blood in your stools (bright red or black tarry stools, with or without vomiting). °· You have blood in your urine. °· You develop chills, with or without a fever. °· You pass out. °MAKE SURE YOU:  °· Understand these instructions. °· Will watch your condition. °· Will get help right away if you are not doing well or get worse. °Document Released: 10/07/2007 Document Revised: 04/26/2014 Document Reviewed: 10/27/2009 °ExitCare® Patient Information ©2015 [employer]. This information is not intended to replace advice given to you by your health care provider. Make sure you discuss any questions you have with your health care provider. ° °Nausea and Vomiting °Nausea is a sick feeling that often comes before throwing up (vomiting). Vomiting is a reflex where stomach contents come out of your mouth. Vomiting can cause severe loss of body fluids (dehydration). Children and elderly adults can become dehydrated quickly, especially if they also have diarrhea. Nausea and vomiting are symptoms of a condition or disease. It is important to find the cause of your symptoms. °CAUSES  °· Direct irritation of the stomach lining. This irritation can result from increased acid production (gastroesophageal reflux disease), infection, food poisoning, taking certain medicines (such as nonsteroidal anti-inflammatory drugs), alcohol use, or tobacco use. °· Signals from the brain. These signals could be caused by a headache, heat exposure, an inner ear disturbance, increased pressure in the brain from injury, infection, a tumor, or a concussion, pain, emotional stimulus, or metabolic problems. °· An obstruction in the gastrointestinal tract (bowel obstruction). °· Illnesses such as diabetes,  hepatitis, gallbladder problems, appendicitis, kidney problems, cancer, sepsis, atypical symptoms of a heart attack, or eating disorders. °· Medical treatments such as chemotherapy and radiation. °· Receiving medicine that makes you sleep (general anesthetic) during surgery. °DIAGNOSIS °Your caregiver may ask for tests to be done if the problems do not improve after a few days. Tests may also be done if symptoms are severe or if the reason for the nausea and vomiting is not clear. Tests may include: °· Urine tests. °· Blood tests. °· Stool tests. °· Cultures (to look for evidence of infection). °· X-rays or other imaging studies. °Test results can help your caregiver make decisions about treatment or the need for additional tests. °TREATMENT °You need to stay well hydrated. Drink frequently but in small amounts. You may wish to drink water, sports drinks, clear broth, or eat frozen ice pops or gelatin dessert to help stay hydrated. When you eat, eating slowly may help prevent nausea. There are also some antinausea medicines that may help prevent nausea. °HOME CARE INSTRUCTIONS  °· Take all medicine as directed by your caregiver. °· If you do not have an appetite, do not force yourself to eat. However, you must continue to drink fluids. °· If you have an appetite, eat a normal diet unless your caregiver tells you differently. °¨ Eat a variety of complex carbohydrates (rice, wheat, potatoes, bread), lean meats, yogurt, fruits, and vegetables. °¨ Avoid high-fat foods because they are more difficult to digest. °· Drink enough water and fluids   to keep your urine clear or pale yellow. °· If you are dehydrated, ask your caregiver for specific rehydration instructions. Signs of dehydration may include: °¨ Severe thirst. °¨ Dry lips and mouth. °¨ Dizziness. °¨ Dark urine. °¨ Decreasing urine frequency and amount. °¨ Confusion. °¨ Rapid breathing or pulse. °SEEK IMMEDIATE MEDICAL CARE IF:  °· You have blood or brown flecks  (like coffee grounds) in your vomit. °· You have black or bloody stools. °· You have a severe headache or stiff neck. °· You are confused. °· You have severe abdominal pain. °· You have chest pain or trouble breathing. °· You do not urinate at least once every 8 hours. °· You develop cold or clammy skin. °· You continue to vomit for longer than 24 to 48 hours. °· You have a fever. °MAKE SURE YOU:  °· Understand these instructions. °· Will watch your condition. °· Will get help right away if you are not doing well or get worse. °Document Released: 12/10/2005 Document Revised: 03/03/2012 Document Reviewed: 05/09/2011 °ExitCare® Patient Information ©2015 [employer]. This information is not intended to replace advice given to you by your health care provider. Make sure you discuss any questions you have with your health care provider. ° °

## 2014-12-31 ENCOUNTER — Ambulatory Visit: Payer: Medicaid Other

## 2015-01-03 ENCOUNTER — Ambulatory Visit (INDEPENDENT_AMBULATORY_CARE_PROVIDER_SITE_OTHER): Payer: Medicaid Other | Admitting: Adult Health

## 2015-01-03 ENCOUNTER — Encounter: Payer: Self-pay | Admitting: Adult Health

## 2015-01-03 DIAGNOSIS — Z3202 Encounter for pregnancy test, result negative: Secondary | ICD-10-CM

## 2015-01-03 DIAGNOSIS — Z3042 Encounter for surveillance of injectable contraceptive: Secondary | ICD-10-CM

## 2015-01-03 LAB — POCT URINE PREGNANCY: PREG TEST UR: NEGATIVE

## 2015-01-03 MED ORDER — MEDROXYPROGESTERONE ACETATE 150 MG/ML IM SUSP
150.0000 mg | Freq: Once | INTRAMUSCULAR | Status: AC
  Administered 2015-01-03: 150 mg via INTRAMUSCULAR

## 2015-01-17 ENCOUNTER — Ambulatory Visit: Payer: Medicaid Other | Admitting: Family Medicine

## 2015-01-26 ENCOUNTER — Ambulatory Visit (INDEPENDENT_AMBULATORY_CARE_PROVIDER_SITE_OTHER): Payer: Medicaid Other | Admitting: Family Medicine

## 2015-01-26 ENCOUNTER — Encounter: Payer: Self-pay | Admitting: Family Medicine

## 2015-01-26 VITALS — BP 124/82 | HR 82 | Resp 16 | Ht 61.0 in | Wt 122.0 lb

## 2015-01-26 DIAGNOSIS — G43809 Other migraine, not intractable, without status migrainosus: Secondary | ICD-10-CM

## 2015-01-26 DIAGNOSIS — Z23 Encounter for immunization: Secondary | ICD-10-CM

## 2015-01-26 DIAGNOSIS — Z720 Tobacco use: Secondary | ICD-10-CM

## 2015-01-26 DIAGNOSIS — F17200 Nicotine dependence, unspecified, uncomplicated: Secondary | ICD-10-CM

## 2015-01-26 MED ORDER — TOPIRAMATE 25 MG PO CPSP: 25.0000 mg | ORAL_CAPSULE | Freq: Two times a day (BID) | ORAL | Status: DC

## 2015-01-26 MED ORDER — SUMATRIPTAN SUCCINATE 50 MG PO TABS: ORAL_TABLET | ORAL | Status: DC

## 2015-01-26 NOTE — Progress Notes (Signed)
   Subjective:    Patient ID: Jaclyn Howe, female    DOB: 07/14/1986, 29 y.o.   MRN: [id number]  HPI  2 month h/o alternate day headache, left pounding , relieved by sleeping, accompanied by nausea, both light and sound bother her Has been diagnosed, with migraine headaches in the past. No pap in several years will return for this Ongoing nicotine use , counseled, unwilling to quit Needs flu vaccine   Review of Systems See HPI Denies recent fever or chills. Denies sinus pressure, nasal congestion, ear pain or sore throat. Denies chest congestion, productive cough or wheezing. Denies chest pains, palpitations and leg swelling Denies abdominal pain, nausea, vomiting,diarrhea or constipation.   Denies dysuria, frequency, hesitancy or incontinence. Denies joint pain, swelling and limitation in mobility.  Denies depression, anxiety or insomnia. Denies skin break down or rash.        Objective:   Physical Exam BP 124/82 mmHg  Pulse 82  Resp 16  Ht 5\' 1"  (1.549 m)  Wt 122 lb (55.339 kg)  BMI 23.06 kg/m2  SpO2 98%  Patient alert and oriented and in no cardiopulmonary distress.  HEENT: No facial asymmetry, EOMI,   oropharynx pink and moist.  Neck supple no JVD, no mass.  Chest: Clear to auscultation bilaterally.  CVS: S1, S2 no murmurs, no S3.Regular rate.  ABD: Soft non tender.   Ext: No edema  MS: Adequate ROM spine, shoulders, hips and knees.  Skin: Intact, no ulcerations or rash noted.  Psych: Good eye contact, normal affect. Memory intact not anxious or depressed appearing.  CNS: CN 2-12 intact, power,  normal throughout.no focal deficits noted.        Assessment & Plan:  Need for prophylactic vaccination and inoculation against influenza Vaccine administered at visit.    NICOTINE ADDICTION Continues to smoke and unwilling to  Set a quit date currently Patient counseled for approximately 5 minutes regarding the health risks of ongoing nicotine  use, specifically all types of cancer, heart disease, stroke and respiratory failure. The options available for help with cessation ,the behavioral changes to assist the process, and the option to either gradully reduce usage  Or abruptly stop.is also discussed. Pt is also encouraged to set specific goals in number of cigarettes used daily, as well as to set a quit date.    Migraine Uncontrolled, start daily prophylaxis, and abortive med and headache diary

## 2015-01-26 NOTE — Patient Instructions (Signed)
cPE and pap  in 3 month, call if you need me before  Two new meds for headaches. Take topamax every day to prevent the, use immitrex as dirrected  Start headache diary and avoid the triggers  Migraine Headache A migraine headache is very bad, throbbing pain on one or both sides of your head. Talk to your doctor about what things may bring on (trigger) your migraine headaches. HOME CARE  Only take medicines as told by your doctor.  Lie down in a dark, quiet room when you have a migraine.  Keep a journal to find out if certain things bring on migraine headaches. For example, write down:  What you eat and drink.  How much sleep you get.  Any change to your diet or medicines.  Lessen how much alcohol you drink.  Quit smoking if you smoke.  Get enough sleep.  Lessen any stress in your life.  Keep lights dim if bright lights bother you or make your migraines worse. GET HELP RIGHT AWAY IF:   Your migraine becomes really bad.  You have a fever.  You have a stiff neck.  You have trouble seeing.  Your muscles are weak, or you lose muscle control.  You lose your balance or have trouble walking.  You feel like you will pass out (faint), or you pass out.  You have really bad symptoms that are different than your first symptoms. MAKE SURE YOU:   Understand these instructions.  Will watch your condition.  Will get help right away if you are not doing well or get worse. Document Released: 09/18/2008 Document Revised: 03/03/2012 Document Reviewed: 08/17/2013 [facility] HExitCare Patient Information 2015 [place]Care, [place]. This information is not intended to replace advice given to you by your health care provider. Make sure you discuss any questions you have with your health care provider.

## 2015-01-30 DIAGNOSIS — Z23 Encounter for immunization: Secondary | ICD-10-CM | POA: Insufficient documentation

## 2015-01-30 NOTE — Assessment & Plan Note (Signed)
Vaccine administered at visit.  

## 2015-01-30 NOTE — Assessment & Plan Note (Signed)
Uncontrolled, start daily prophylaxis, and abortive med and headache diary

## 2015-01-30 NOTE — Assessment & Plan Note (Signed)
Continues to smoke and unwilling to  Set a quit date currently Patient counseled for approximately 5 minutes regarding the health risks of ongoing nicotine use, specifically all types of cancer, heart disease, stroke and respiratory failure. The options available for help with cessation ,the behavioral changes to assist the process, and the option to either gradully reduce usage  Or abruptly stop.is also discussed. Pt is also encouraged to set specific goals in number of cigarettes used daily, as well as to set a quit date.

## 2015-02-03 IMAGING — CT CT ABD-PELV W/ CM
2 of 3 series · 17 of 46 positions shown, 19 images · IV contrast (Omnipaque 300)
Comparison: 09/13/2007

CLINICAL DATA: Hematemesis.  Abdominal pain.

CT ABDOMEN AND PELVIS WITH CONTRAST
TECHNIQUE: Multidetector CT imaging of the abdomen and pelvis was
performed following the standard protocol during bolus
administration of intravenous contrast.
Contrast: 100mL OMNIPAQUE IOHEXOL 300 MG/ML  SOLN

[Series 2: abd_pel_with 5.0 b40f · axial · 0.57mm/px · z∈[-348,-13]mm · 14 of 77 slices shown, 16 images]
[im 5/77  soft-tissue]
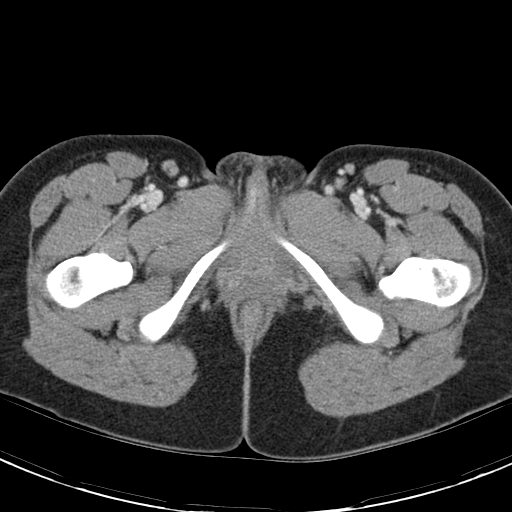
[im 5/77  bone]
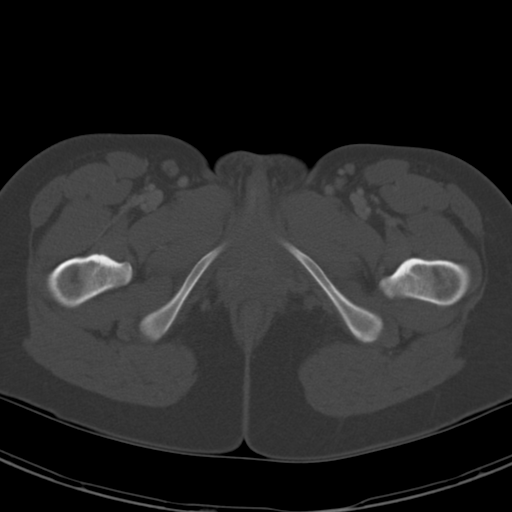
[im 10/77  soft-tissue]
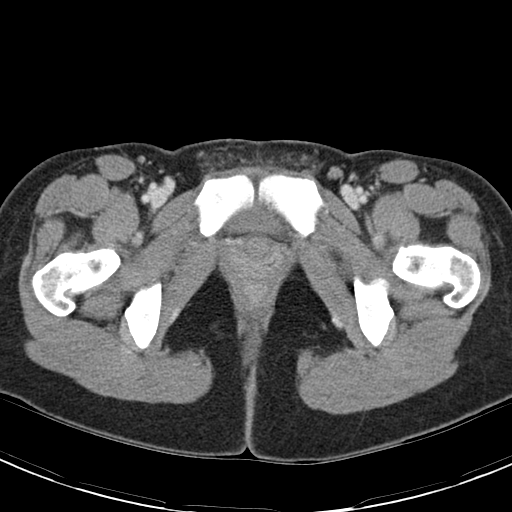
[im 15/77  soft-tissue]
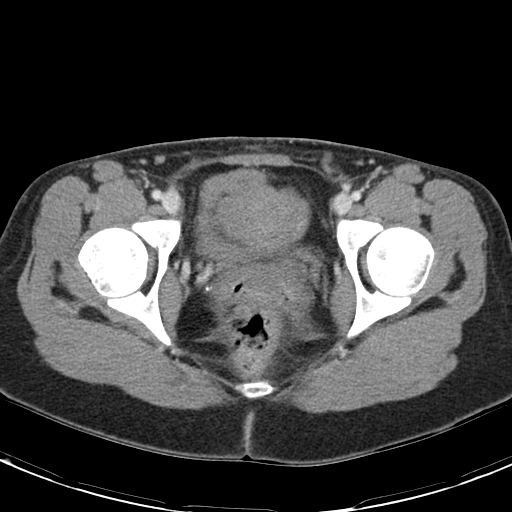
[im 20/77  soft-tissue]
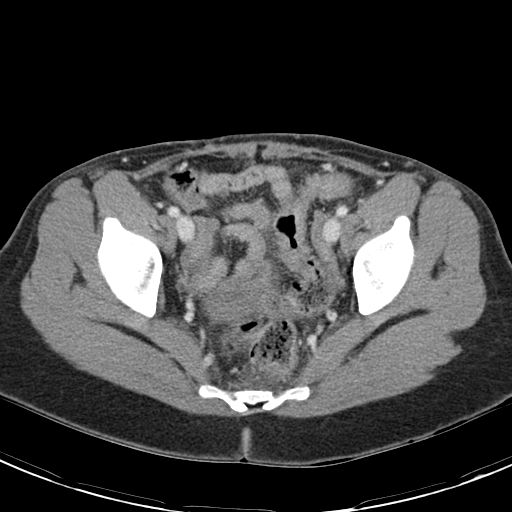
[im 25/77  soft-tissue]
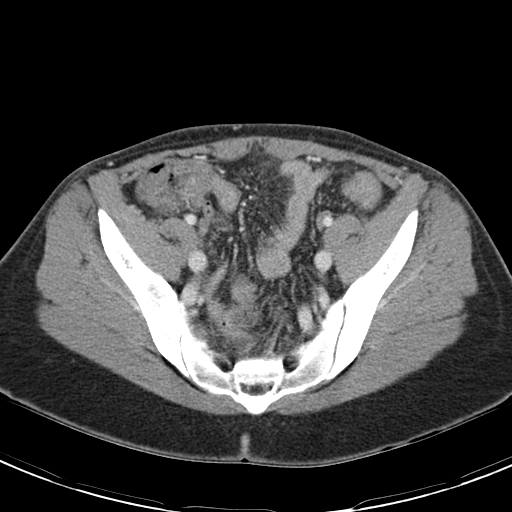
[im 30/77  soft-tissue]
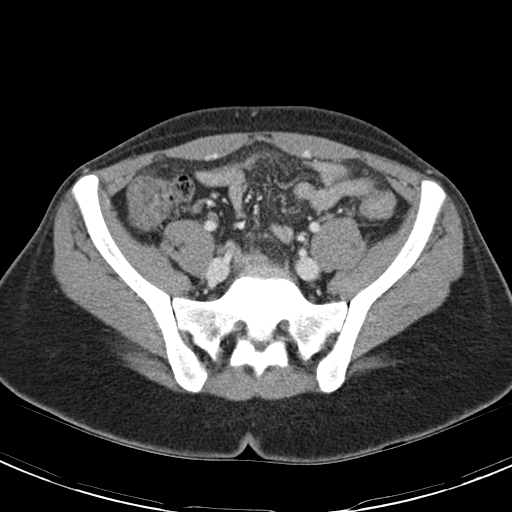
[im 35/77  soft-tissue]
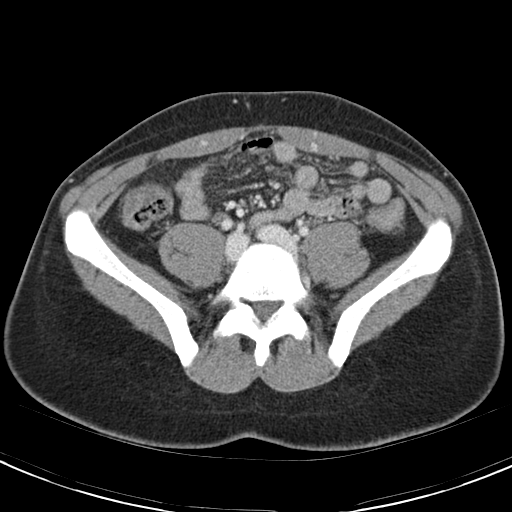
[im 42/77  soft-tissue]
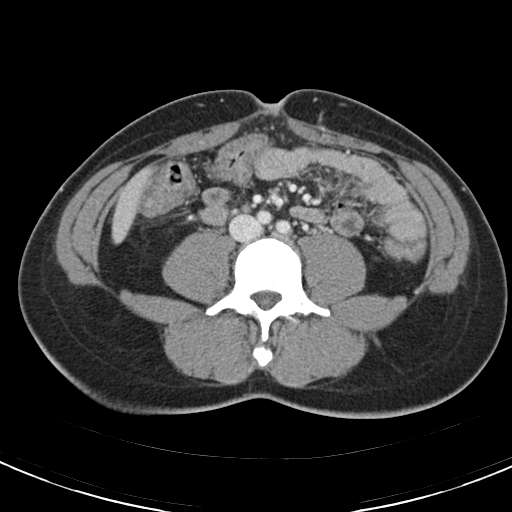
[im 47/77  soft-tissue]
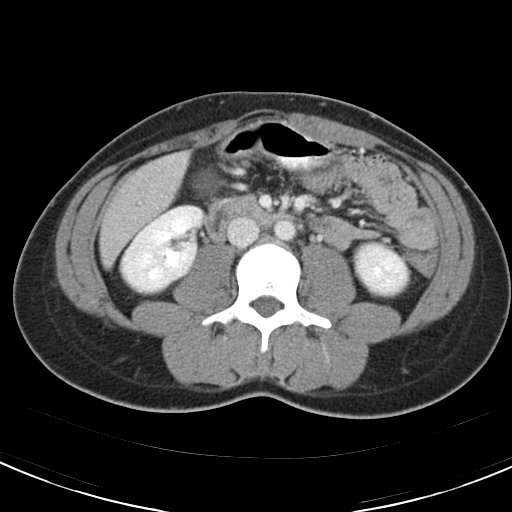
[im 47/77  bone]
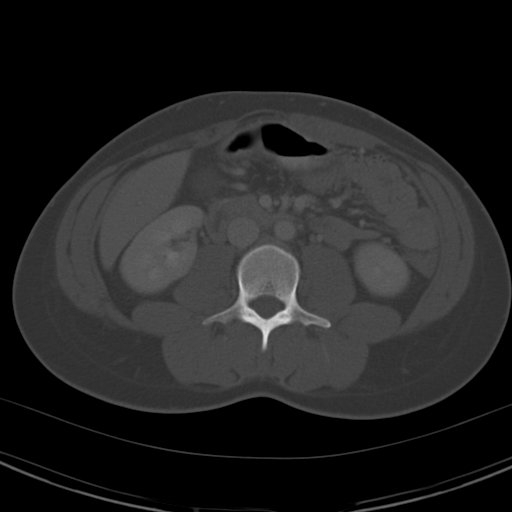
[im 52/77  soft-tissue]
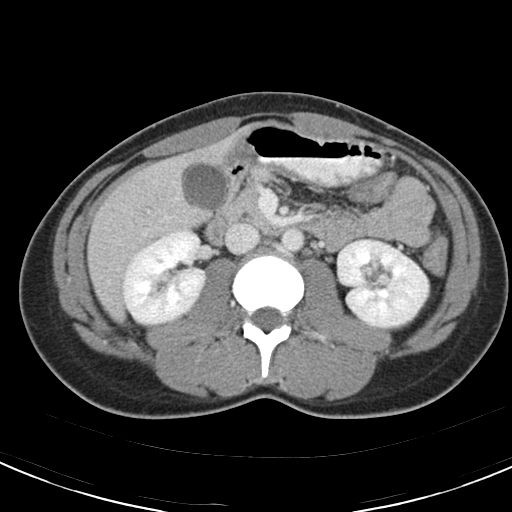
[im 57/77  soft-tissue]
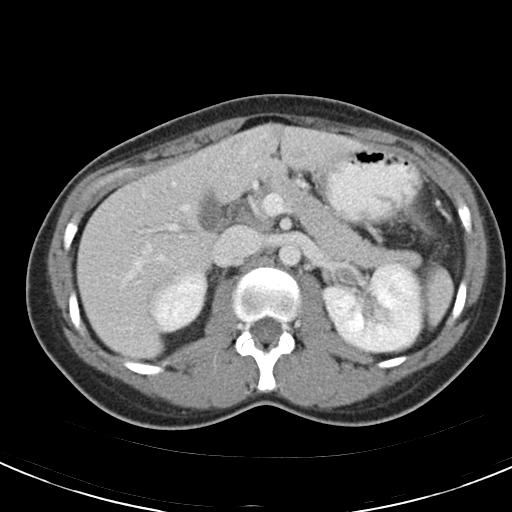
[im 62/77  soft-tissue]
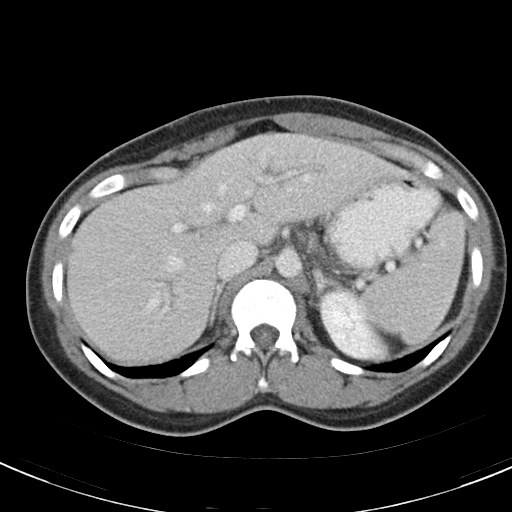
[im 67/77  soft-tissue]
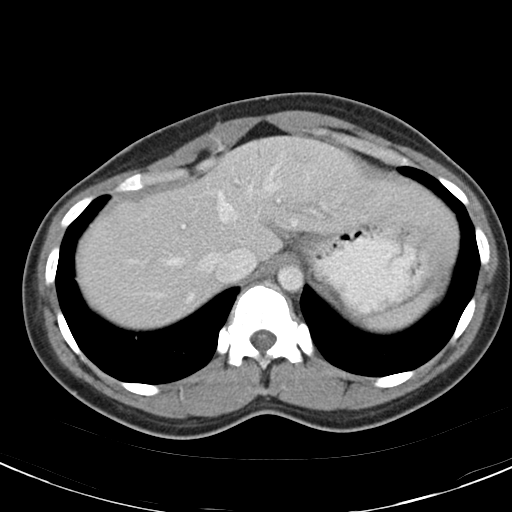
[im 72/77  soft-tissue]
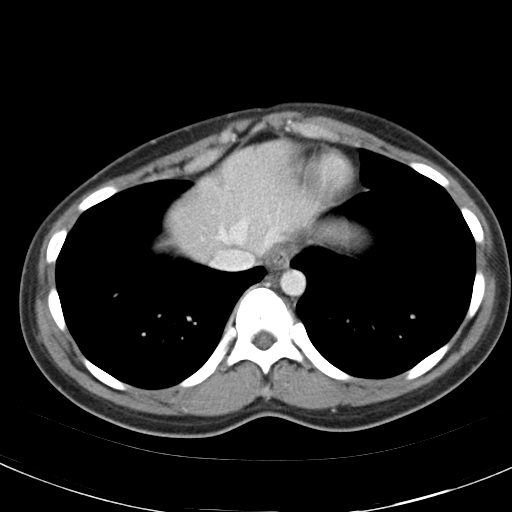

[Series 4: abd_pel_with 3.0 spo cor · coronal · 0.65mm/px · 3 of 60 slices shown]
[im 20/60  soft-tissue]
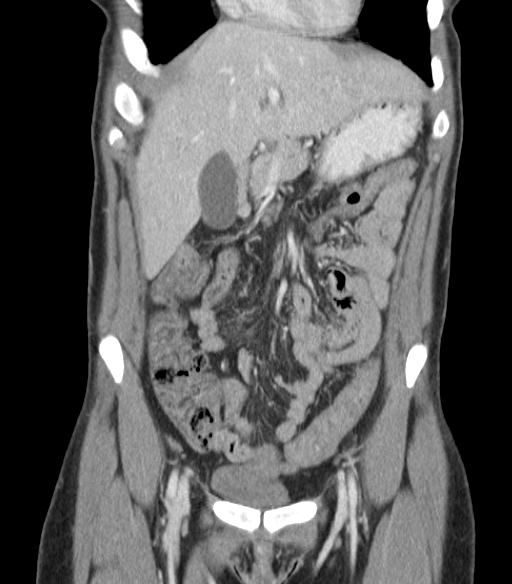
[im 27/60  soft-tissue]
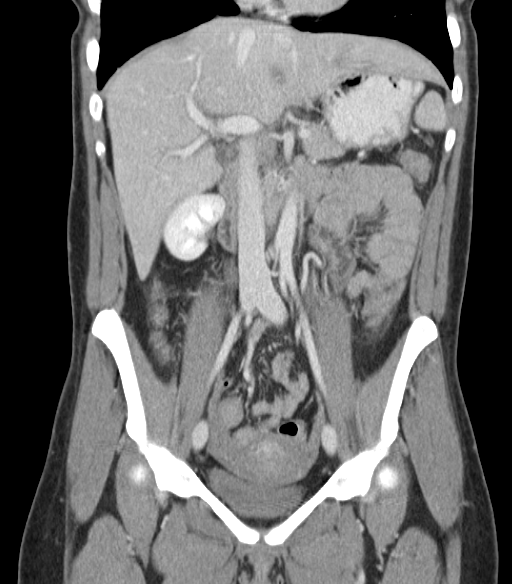
[im 33/60  soft-tissue]
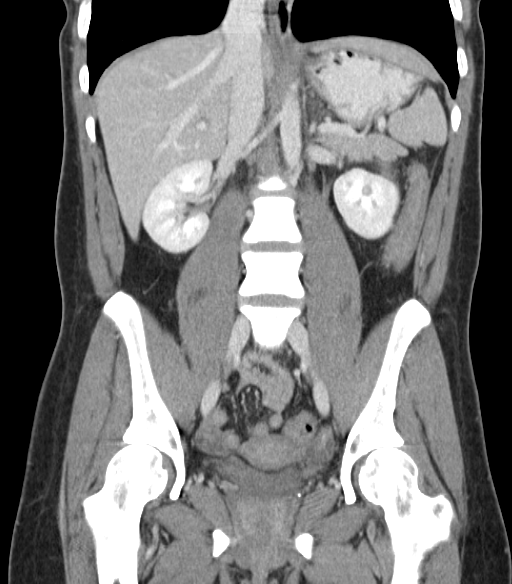

[17 of 46 positions shown; findings below may reference images not displayed]

FINDINGS: Lung bases are clear.  No effusions.  Heart is normal
size.

Liver, gallbladder, spleen, pancreas, adrenals and kidneys are
unremarkable.  Appendix is visualized and is normal.  Uterus,
adnexa urinary bladder grossly unremarkable.  Small bowel
decompressed.  Stomach and colon grossly unremarkable.  No free
fluid, free air or adenopathy.  Aorta is normal caliber.

No acute bony abnormality.
IMPRESSION: No acute findings in the abdomen or pelvis.

## 2015-03-28 ENCOUNTER — Encounter: Payer: Self-pay | Admitting: *Deleted

## 2015-03-28 ENCOUNTER — Ambulatory Visit (INDEPENDENT_AMBULATORY_CARE_PROVIDER_SITE_OTHER): Payer: Medicaid Other | Admitting: *Deleted

## 2015-03-28 DIAGNOSIS — Z3042 Encounter for surveillance of injectable contraceptive: Secondary | ICD-10-CM | POA: Diagnosis not present

## 2015-03-28 DIAGNOSIS — Z3202 Encounter for pregnancy test, result negative: Secondary | ICD-10-CM | POA: Diagnosis not present

## 2015-03-28 LAB — POCT URINE PREGNANCY: Preg Test, Ur: NEGATIVE

## 2015-03-28 MED ORDER — MEDROXYPROGESTERONE ACETATE 150 MG/ML IM SUSP
150.0000 mg | Freq: Once | INTRAMUSCULAR | Status: AC
  Administered 2015-03-28: 150 mg via INTRAMUSCULAR

## 2015-03-28 NOTE — Progress Notes (Signed)
Pt here for Depo. Reports no problems at this time. Return in 12 weeks for next shot. JSY 

## 2015-04-05 ENCOUNTER — Encounter (HOSPITAL_COMMUNITY): Payer: Self-pay | Admitting: Emergency Medicine

## 2015-04-05 ENCOUNTER — Emergency Department (HOSPITAL_COMMUNITY)
Admission: EM | Admit: 2015-04-05 | Discharge: 2015-04-05 | Disposition: A | Discharge status: Home / Self Care | Payer: Medicaid Other | Attending: Emergency Medicine | Admitting: Emergency Medicine

## 2015-04-05 DIAGNOSIS — Z720 Tobacco use: Secondary | ICD-10-CM | POA: Diagnosis not present

## 2015-04-05 DIAGNOSIS — S39012A Strain of muscle, fascia and tendon of lower back, initial encounter: Secondary | ICD-10-CM | POA: Diagnosis not present

## 2015-04-05 DIAGNOSIS — Z79899 Other long term (current) drug therapy: Secondary | ICD-10-CM | POA: Diagnosis not present

## 2015-04-05 DIAGNOSIS — R1030 Lower abdominal pain, unspecified: Secondary | ICD-10-CM | POA: Diagnosis present

## 2015-04-05 DIAGNOSIS — Z3202 Encounter for pregnancy test, result negative: Secondary | ICD-10-CM | POA: Diagnosis not present

## 2015-04-05 DIAGNOSIS — Y998 Other external cause status: Secondary | ICD-10-CM | POA: Diagnosis not present

## 2015-04-05 DIAGNOSIS — Y9289 Other specified places as the place of occurrence of the external cause: Secondary | ICD-10-CM | POA: Insufficient documentation

## 2015-04-05 DIAGNOSIS — X58XXXA Exposure to other specified factors, initial encounter: Secondary | ICD-10-CM | POA: Diagnosis not present

## 2015-04-05 DIAGNOSIS — G8929 Other chronic pain: Secondary | ICD-10-CM | POA: Insufficient documentation

## 2015-04-05 DIAGNOSIS — Y9389 Activity, other specified: Secondary | ICD-10-CM | POA: Diagnosis not present

## 2015-04-05 LAB — URINALYSIS, ROUTINE W REFLEX MICROSCOPIC
BILIRUBIN URINE: NEGATIVE
Glucose, UA: NEGATIVE mg/dL
Hgb urine dipstick: NEGATIVE
Ketones, ur: NEGATIVE mg/dL
Leukocytes, UA: NEGATIVE
Nitrite: NEGATIVE
Protein, ur: NEGATIVE mg/dL
Specific Gravity, Urine: 1.015 (ref 1.005–1.030)
Urobilinogen, UA: 0.2 mg/dL (ref 0.0–1.0)
pH: 6.5 (ref 5.0–8.0)

## 2015-04-05 LAB — BASIC METABOLIC PANEL
Anion gap: 8 (ref 5–15)
BUN: 8 mg/dL (ref 6–23)
CHLORIDE: 109 mmol/L (ref 96–112)
CO2: 24 mmol/L (ref 19–32)
Calcium: 9 mg/dL (ref 8.4–10.5)
Creatinine, Ser: 0.71 mg/dL (ref 0.50–1.10)
GFR calc Af Amer: >90 mL/min (ref >90)
GFR calc non Af Amer: >90 mL/min (ref >90)
Glucose, Bld: 98 mg/dL (ref 70–99)
Potassium: 4 mmol/L (ref 3.5–5.1)
Sodium: 141 mmol/L (ref 135–145)

## 2015-04-05 LAB — POC URINE PREG, ED: Preg Test, Ur: NEGATIVE

## 2015-04-05 MED ORDER — IBUPROFEN 800 MG PO TABS: 800.0000 mg | ORAL_TABLET | Freq: Three times a day (TID) | ORAL | Status: DC

## 2015-04-05 MED ORDER — METHOCARBAMOL 500 MG PO TABS: 500.0000 mg | ORAL_TABLET | Freq: Three times a day (TID) | ORAL | Status: DC

## 2015-04-05 MED ORDER — IBUPROFEN 600 MG PO TABS: 600.0000 mg | ORAL_TABLET | Freq: Four times a day (QID) | ORAL | Status: DC

## 2015-04-05 NOTE — ED Notes (Signed)
Pt states she is having lower back pain that worsens with movement. Pt denies any injury to back that she knows of. Denies any urinary symptoms or GI symptoms. Pt in NAD.

## 2015-04-05 NOTE — ED Provider Notes (Signed)
CSN: [id number]     Arrival date & time 04/05/15  0801 History   First MD Initiated Contact with Patient 04/05/15 [phone]     Chief Complaint  Patient presents with  . Flank Pain     (Consider location/radiation/quality/duration/timing/severity/associated sxs/prior Treatment) Patient is a 29 y.o. female presenting with flank pain. The history is provided by the patient.  Flank Pain This is a new problem. The current episode started in the past 7 days. The problem occurs intermittently. The problem has been gradually worsening. Associated symptoms include headaches. Pertinent negatives include no coughing or fever. Associated symptoms comments: Back pain  Sleeping a lot. The symptoms are aggravated by walking and exertion. She has tried nothing for the symptoms. The treatment provided no relief.    Past Medical History  Diagnosis Date  . Chronic headache   . Chronic abdominal pain   . Nausea and vomiting     recurrent   Past Surgical History  Procedure Laterality Date  . Elective abortion     No family history on file. History  Substance Use Topics  . Smoking status: Current Every Day Smoker -- 0.25 packs/day    Types: Cigarettes  . Smokeless tobacco: Never Used  . Alcohol Use: No   OB History    Gravida Para Term Preterm AB TAB SAB Ectopic Multiple Living   1 1 1             Review of Systems  Constitutional: Negative for fever.  Respiratory: Negative for cough.   Genitourinary: Positive for flank pain.  Neurological: Positive for headaches.  All other systems reviewed and are negative.     Allergies  Review of patient's allergies indicates no known allergies.  Home Medications   Prior to Admission medications   Medication Sig Start Date End Date Taking? Authorizing Provider  medroxyPROGESTERone (DEPO-PROVERA) 150 MG/ML injection Inject 1 mL (150 mg total) into the muscle every 3 (three) months. 07/08/14   Adline PotterJennifer A Griffin, NP  topiramate (TOPAMAX) 25 MG  capsule Take 1 capsule (25 mg total) by mouth 2 (two) times daily. 01/26/15   Kerri PerchesMargaret E Simpson, MD   BP 146/90 mmHg  Pulse 84  Temp(Src) 97.9 F (36.6 C) (Oral)  Resp 16  Ht 5\' 1"  (1.549 m)  Wt 120 lb (54.432 kg)  BMI 22.69 kg/m2  SpO2 100% Physical Exam  Constitutional: She is oriented to person, place, and time. She appears well-developed and well-nourished.  Non-toxic appearance.  HENT:  Head: Normocephalic.  Right Ear: Tympanic membrane and external ear normal.  Left Ear: Tympanic membrane and external ear normal.  Eyes: EOM and lids are normal. Pupils are equal, round, and reactive to light.  Neck: Normal range of motion. Neck supple. Carotid bruit is not present.  Cardiovascular: Normal rate, regular rhythm, normal heart sounds, intact distal pulses and normal pulses.   Pulmonary/Chest: Breath sounds normal. No respiratory distress.  Abdominal: Soft. Bowel sounds are normal. There is no tenderness. There is no guarding.  Musculoskeletal: Normal range of motion.       Lumbar back: She exhibits tenderness.       Back:  Lymphadenopathy:       Head (right side): No submandibular adenopathy present.       Head (left side): No submandibular adenopathy present.    She has no cervical adenopathy.  Neurological: She is alert and oriented to person, place, and time. She has normal strength. No cranial nerve deficit or sensory deficit.  Skin: Skin is  warm and dry.  Psychiatric: She has a normal mood and affect. Her speech is normal.  Nursing note and vitals reviewed.   ED Course  Procedures (including critical care time) Labs Review Labs Reviewed  URINALYSIS, ROUTINE W REFLEX MICROSCOPIC  POC URINE PREG, ED    Imaging Review No results found.   EKG Interpretation None      MDM  Vital signs within normal limits. Pulse oximetry is 100% on room air. Within normal limits by my interpretation.  Basic metabolic panel is well within normal limits. Urine pregnancy is  negative. Urinalysis is negative for any acute changes.  Reexamination of the back reveals pain with attempted range of motion of the lumbar spine area. Suspect that this is a musculoskeletal problem. The patient is advised to use a heating pad. She is advised to see Dr. [name] for follow-up and recheck in the office. She's given a prescription for Robaxin and ibuprofen.    Final diagnoses:  None    *I have reviewed nursing notes, vital signs, and all appropriate lab and imaging results for this patient.[address] PA-C 04/05/15 1009  Shon Baton, MD 04/05/15 2017

## 2015-04-05 NOTE — ED Notes (Signed)
PA at bedside.

## 2015-04-05 NOTE — ED Notes (Signed)
Patient with c/o left flank pain that radiates to lower back x 3 days.

## 2015-04-05 NOTE — Discharge Instructions (Signed)
Back Pain, Adult Low back pain is very common. About 1 in 5 people have back pain.The cause of low back pain is rarely dangerous. The pain often gets better over time.About half of people with a sudden onset of back pain feel better in just 2 weeks. About 8 in 10 people feel better by 6 weeks.  CAUSES Some common causes of back pain include:  Strain of the muscles or ligaments supporting the spine.  Wear and tear (degeneration) of the spinal discs.  Arthritis.  Direct injury to the back. DIAGNOSIS Most of the time, the direct cause of low back pain is not known.However, back pain can be treated effectively even when the exact cause of the pain is unknown.Answering your caregiver's questions about your overall health and symptoms is one of the most accurate ways to make sure the cause of your pain is not dangerous. If your caregiver needs more information, he or she may order lab work or imaging tests (X-rays or MRIs).However, even if imaging tests show changes in your back, this usually does not require surgery. HOME CARE INSTRUCTIONS For many people, back pain returns.Since low back pain is rarely dangerous, it is often a condition that people can learn to manageon their own.   Remain active. It is stressful on the back to sit or stand in one place. Do not sit, drive, or stand in one place for more than 30 minutes at a time. Take short walks on level surfaces as soon as pain allows.Try to increase the length of time you walk each day.  Do not stay in bed.Resting more than 1 or 2 days can delay your recovery.  Do not avoid exercise or work.Your body is made to move.It is not dangerous to be active, even though your back may hurt.Your back will likely heal faster if you return to being active before your pain is gone.  Pay attention to your body when you bend and lift. Many people have less discomfortwhen lifting if they bend their knees, keep the load close to their bodies,and  avoid twisting. Often, the most comfortable positions are those that put less stress on your recovering back.  Find a comfortable position to sleep. Use a firm mattress and lie on your side with your knees slightly bent. If you lie on your back, put a pillow under your knees.  Only take over-the-counter or prescription medicines as directed by your caregiver. Over-the-counter medicines to reduce pain and inflammation are often the most helpful.Your caregiver may prescribe muscle relaxant drugs.These medicines help dull your pain so you can more quickly return to your normal activities and healthy exercise.  Put ice on the injured area.  Put ice in a plastic bag.  Place a towel between your skin and the bag.  Leave the ice on for 15-20 minutes, 03-04 times a day for the first 2 to 3 days. After that, ice and heat may be alternated to reduce pain and spasms.  Ask your caregiver about trying back exercises and gentle massage. This may be of some benefit.  Avoid feeling anxious or stressed.Stress increases muscle tension and can worsen back pain.It is important to recognize when you are anxious or stressed and learn ways to manage it.Exercise is a great option. SEEK MEDICAL CARE IF:  You have pain that is not relieved with rest or medicine.  You have pain that does not improve in 1 week.  You have new symptoms.  You are generally not feeling well. SEEK   IMMEDIATE MEDICAL CARE IF:   You have pain that radiates from your back into your legs.  You develop new bowel or bladder control problems.  You have unusual weakness or numbness in your arms or legs.  You develop nausea or vomiting.  You develop abdominal pain.  You feel faint. Document Released: 12/10/2005 Document Revised: 06/10/2012 Document Reviewed: 04/13/2014 Exit[employer] Patient Information 2015 [employer]. This information is not intended to replace advice given to you by your health care provider. Make sure you  discuss any questions you have with your health care provider.  

## 2015-04-16 ENCOUNTER — Encounter (HOSPITAL_COMMUNITY): Payer: Self-pay | Admitting: Emergency Medicine

## 2015-04-16 ENCOUNTER — Emergency Department (HOSPITAL_COMMUNITY)
Admission: EM | Admit: 2015-04-16 | Discharge: 2015-04-16 | Disposition: A | Discharge status: Home / Self Care | Payer: Medicaid Other | Attending: Emergency Medicine | Admitting: Emergency Medicine

## 2015-04-16 DIAGNOSIS — Z791 Long term (current) use of non-steroidal anti-inflammatories (NSAID): Secondary | ICD-10-CM | POA: Diagnosis not present

## 2015-04-16 DIAGNOSIS — R1084 Generalized abdominal pain: Secondary | ICD-10-CM | POA: Insufficient documentation

## 2015-04-16 DIAGNOSIS — Z720 Tobacco use: Secondary | ICD-10-CM | POA: Diagnosis not present

## 2015-04-16 DIAGNOSIS — R112 Nausea with vomiting, unspecified: Secondary | ICD-10-CM | POA: Diagnosis not present

## 2015-04-16 DIAGNOSIS — G8929 Other chronic pain: Secondary | ICD-10-CM | POA: Diagnosis not present

## 2015-04-16 DIAGNOSIS — Z79899 Other long term (current) drug therapy: Secondary | ICD-10-CM | POA: Insufficient documentation

## 2015-04-16 DIAGNOSIS — Z3202 Encounter for pregnancy test, result negative: Secondary | ICD-10-CM | POA: Insufficient documentation

## 2015-04-16 LAB — URINALYSIS, ROUTINE W REFLEX MICROSCOPIC
BILIRUBIN URINE: NEGATIVE
Glucose, UA: NEGATIVE mg/dL
HGB URINE DIPSTICK: NEGATIVE
Ketones, ur: NEGATIVE mg/dL
Leukocytes, UA: NEGATIVE
Nitrite: NEGATIVE
PH: 6 (ref 5.0–8.0)
Protein, ur: NEGATIVE mg/dL
Urobilinogen, UA: 0.2 mg/dL (ref 0.0–1.0)

## 2015-04-16 LAB — COMPREHENSIVE METABOLIC PANEL
ALT: 21 U/L (ref 0–35)
AST: 26 U/L (ref 0–37)
Albumin: 4.5 g/dL (ref 3.5–5.2)
Alkaline Phosphatase: 81 U/L (ref 39–117)
Anion gap: 11 (ref 5–15)
BUN: 9 mg/dL (ref 6–23)
CHLORIDE: 113 mmol/L — AB (ref 96–112)
CO2: 20 mmol/L (ref 19–32)
Calcium: 9 mg/dL (ref 8.4–10.5)
Creatinine, Ser: 0.74 mg/dL (ref 0.50–1.10)
GFR calc Af Amer: >90 mL/min (ref >90)
GFR calc non Af Amer: >90 mL/min (ref >90)
Glucose, Bld: 99 mg/dL (ref 70–99)
Potassium: 3.9 mmol/L (ref 3.5–5.1)
SODIUM: 144 mmol/L (ref 135–145)
TOTAL PROTEIN: 8.1 g/dL (ref 6.0–8.3)
Total Bilirubin: 0.5 mg/dL (ref 0.3–1.2)

## 2015-04-16 LAB — CBC WITH DIFFERENTIAL/PLATELET
Basophils Absolute: 0 10*3/uL (ref 0.0–0.1)
Basophils Relative: 0 % (ref 0–1)
EOS PCT: 0 % (ref 0–5)
Eosinophils Absolute: 0 10*3/uL (ref 0.0–0.7)
HCT: 44 % (ref 36.0–46.0)
Hemoglobin: 15 g/dL (ref 12.0–15.0)
LYMPHS ABS: 1.9 10*3/uL (ref 0.7–4.0)
Lymphocytes Relative: 30 % (ref 12–46)
MCH: 32.8 pg (ref 26.0–34.0)
MCHC: 34.1 g/dL (ref 30.0–36.0)
MCV: 96.3 fL (ref 78.0–100.0)
Monocytes Absolute: 0.3 10*3/uL (ref 0.1–1.0)
Monocytes Relative: 4 % (ref 3–12)
NEUTROS PCT: 66 % (ref 43–77)
Neutro Abs: 4.1 10*3/uL (ref 1.7–7.7)
PLATELETS: 315 10*3/uL (ref 150–400)
RBC: 4.57 MIL/uL (ref 3.87–5.11)
RDW: 13.6 % (ref 11.5–15.5)
WBC: 6.3 10*3/uL (ref 4.0–10.5)

## 2015-04-16 LAB — LIPASE, BLOOD: Lipase: 34 U/L (ref 11–59)

## 2015-04-16 LAB — PREGNANCY, URINE: Preg Test, Ur: NEGATIVE

## 2015-04-16 MED ORDER — PANTOPRAZOLE SODIUM 40 MG PO TBEC
40.0000 mg | DELAYED_RELEASE_TABLET | Freq: Once | ORAL | Status: AC
  Administered 2015-04-16: 40 mg via ORAL
  Filled 2015-04-16: qty 1

## 2015-04-16 MED ORDER — PROMETHAZINE HCL 25 MG PO TABS: 25.0000 mg | ORAL_TABLET | Freq: Four times a day (QID) | ORAL | Status: DC | PRN

## 2015-04-16 MED ORDER — SODIUM CHLORIDE 0.9 % IV BOLUS (SEPSIS)
1000.0000 mL | Freq: Once | INTRAVENOUS | Status: AC
  Administered 2015-04-16: 1000 mL via INTRAVENOUS

## 2015-04-16 MED ORDER — FAMOTIDINE 20 MG PO TABS: 20.0000 mg | ORAL_TABLET | Freq: Two times a day (BID) | ORAL | Status: DC

## 2015-04-16 MED ORDER — ONDANSETRON HCL 4 MG/2ML IJ SOLN
4.0000 mg | Freq: Once | INTRAMUSCULAR | Status: AC
  Administered 2015-04-16: 4 mg via INTRAVENOUS

## 2015-04-16 MED ORDER — ONDANSETRON HCL 4 MG/2ML IJ SOLN
4.0000 mg | Freq: Once | INTRAMUSCULAR | Status: DC
  Filled 2015-04-16: qty 2

## 2015-04-16 NOTE — ED Provider Notes (Signed)
The patient is a 29 year old female with a history of rather chronic nausea vomiting and abdominal discomfort, this seems to have gotten worse over the last 24 hours. On exam she has clear heart and lung sounds, no tachycardia, moist mucous members, soft abdomen which is minimally tender in the lower abdomen without guarding or peritoneal signs. Labs unremarkable, patient has received antiemetics and fluids, stable for discharge and follow-up in the outpatient setting. We'll also started on anti-acid medications.  Medical screening examination/treatment/procedure(s) were conducted as a shared visit with non-physician practitioner(s) and myself.  I personally evaluated the patient during the encounter.  Clinical Impression:   Final diagnoses:  Non-intractable vomiting with nausea, vomiting of unspecified type         Eber HongBrian Delmos Velaquez, MD 04/17/15 1526

## 2015-04-16 NOTE — Discharge Instructions (Signed)

## 2015-04-16 NOTE — ED Provider Notes (Signed)
CSN: [id number]     Arrival date & time 04/16/15  [id number] History   First MD Initiated Contact with Patient 04/16/15 [phone]     Chief Complaint  Patient presents with  . Emesis     (Consider location/radiation/quality/duration/timing/severity/associated sxs/prior Treatment) The history is provided by the patient.   Jaclyn BeltonShameeka Urquilla is a 29 y.o. female with a history of intermittent episodic nausea and vomiting without clear cause presenting with a 2 day history of symptoms.  She has been unable to tolerate any PO intake without emesis, stating her food came up yesterday within 30 minutes of ingestion, and this am she tried drinking sprite and she started vomiting immediately.  She denies abdominal pain except when vomiting, no fevers, chills, diarrhea, dysuria, vaginal discharge.  She has taken no medicines prior to arrival and has found no alleviators.  She denies substance abuse including etoh.  She has noted a pattern of worsened symptoms when she eats a lot of hot sauce.  Denies possible pregnancy, has current depo injection.     Past Medical History  Diagnosis Date  . Chronic headache   . Chronic abdominal pain   . Nausea and vomiting     recurrent   Past Surgical History  Procedure Laterality Date  . Elective abortion     History reviewed. No pertinent family history. History  Substance Use Topics  . Smoking status: Current Every Day Smoker -- 0.25 packs/day    Types: Cigarettes  . Smokeless tobacco: Never Used  . Alcohol Use: No   OB History    Gravida Para Term Preterm AB TAB SAB Ectopic Multiple Living   1 1 1             Review of Systems  Constitutional: Negative for fever.  HENT: Negative for congestion and sore throat.   Eyes: Negative.   Respiratory: Negative for chest tightness and shortness of breath.   Cardiovascular: Negative for chest pain.  Gastrointestinal: Positive for nausea and vomiting. Negative for abdominal pain, diarrhea and constipation.   Genitourinary: Negative.   Musculoskeletal: Negative for joint swelling, arthralgias and neck pain.  Skin: Negative.  Negative for rash and wound.  Neurological: Negative for dizziness, weakness, light-headedness, numbness and headaches.  Psychiatric/Behavioral: Negative.       Allergies  Review of patient's allergies indicates no known allergies.  Home Medications   Prior to Admission medications   Medication Sig Start Date End Date Taking? Authorizing Provider  ibuprofen (ADVIL,MOTRIN) 800 MG tablet Take 1 tablet (800 mg total) by mouth 3 (three) times daily. 04/05/15  Yes Ivery QualeHobson Bryant, PA-C  medroxyPROGESTERone (DEPO-PROVERA) 150 MG/ML injection Inject 1 mL (150 mg total) into the muscle every 3 (three) months. 07/08/14  Yes Adline PotterJennifer A Griffin, NP  methocarbamol (ROBAXIN) 500 MG tablet Take 1 tablet (500 mg total) by mouth 3 (three) times daily. 04/05/15  Yes Ivery QualeHobson Bryant, PA-C  famotidine (PEPCID) 20 MG tablet Take 1 tablet (20 mg total) by mouth 2 (two) times daily. 04/16/15   Burgess AmorJulie Latandra Loureiro, PA-C  promethazine (PHENERGAN) 25 MG tablet Take 1 tablet (25 mg total) by mouth every 6 (six) hours as needed for nausea or vomiting. 04/16/15   Burgess AmorJulie Tyrez Berrios, PA-C  topiramate (TOPAMAX) 25 MG capsule Take 1 capsule (25 mg total) by mouth 2 (two) times daily. Patient not taking: Reported on 04/05/2015 01/26/15   Kerri PerchesMargaret E Simpson, MD   BP 113/82 mmHg  Pulse 87  Temp(Src) 98.2 F (36.8 C) (Oral)  Resp 18  Ht   (1.549 m)  Wt 135 lb (61.236 kg)  BMI 25.52 kg/m2  SpO2 100% Physical Exam  Constitutional: She appears well-developed and well-nourished.  HENT:  Head: Normocephalic and atraumatic.  Eyes: Conjunctivae are normal.  Neck: Normal range of motion.  Cardiovascular: Normal rate, regular rhythm, normal heart sounds and intact distal pulses.   Pulmonary/Chest: Effort normal and breath sounds normal. She has no wheezes.  Abdominal: Soft. Bowel sounds are normal. There is generalized  tenderness. There is no rigidity, no guarding, no CVA tenderness, no tenderness at McBurney's point and negative Murphy's sign.  Actively dry heaving during exam.  Generalized abdominal ttp, no guarding.  Musculoskeletal: Normal range of motion.  Neurological: She is alert.  Skin: Skin is warm and dry.  Psychiatric: She has a normal mood and affect.  Nursing note and vitals reviewed.   ED Course  Procedures (including critical care time) Labs Review Labs Reviewed  COMPREHENSIVE METABOLIC PANEL - Abnormal; Notable for the following:    Chloride 113 (*)    All other components within normal limits  URINALYSIS, ROUTINE W REFLEX MICROSCOPIC - Abnormal; Notable for the following:    APPearance HAZY (*)    Specific Gravity, Urine >1.030 (*)    All other components within normal limits  CBC WITH DIFFERENTIAL/PLATELET  LIPASE, BLOOD  PREGNANCY, URINE    Imaging Review No results found.   EKG Interpretation None      MDM   Final diagnoses:  Non-intractable vomiting with nausea, vomiting of unspecified type    Pt given IV fluids, zofran with resolution of vomiting and nausea.  Labs reviewed and stable, no obvious source of nausea and emesis.  Recheck of abdomen, still generalized soreness with palpation, no guarding or rebound.  No acute abdomen on exam.  On further discussion,pt describes having egd in 2011, Dr. Jena Gauss which was negative.  Notes at that time indicated nearly daily etoh use.  Pt denies today.  Will prescribe phenergan, pepcid. Plan f/u with pcp prn.  Pt was seen by Dr. Hyacinth Meeker prior to dc.   Burgess Amor, PA-C 04/16/15 1212  Eber Hong, MD 04/17/15 [phone]

## 2015-04-16 NOTE — ED Notes (Signed)
Pt reports nausea,vomiting for last several days. Pt denies any fevers,abdominal pain,diarrhea. Pt reports cramping sensation in abdomen with vomiting.

## 2015-04-19 ENCOUNTER — Ambulatory Visit: Payer: Medicaid Other | Admitting: Family Medicine

## 2015-05-04 ENCOUNTER — Encounter: Payer: Medicaid Other | Admitting: Family Medicine

## 2015-06-15 ENCOUNTER — Other Ambulatory Visit: Payer: Self-pay | Admitting: Adult Health

## 2015-06-20 ENCOUNTER — Encounter: Payer: Self-pay | Admitting: *Deleted

## 2015-06-20 ENCOUNTER — Ambulatory Visit (INDEPENDENT_AMBULATORY_CARE_PROVIDER_SITE_OTHER): Payer: Medicaid Other | Admitting: *Deleted

## 2015-06-20 DIAGNOSIS — Z3042 Encounter for surveillance of injectable contraceptive: Secondary | ICD-10-CM

## 2015-06-20 DIAGNOSIS — Z3202 Encounter for pregnancy test, result negative: Secondary | ICD-10-CM

## 2015-06-20 LAB — POCT URINE PREGNANCY: Preg Test, Ur: NEGATIVE

## 2015-06-20 MED ORDER — MEDROXYPROGESTERONE ACETATE 150 MG/ML IM SUSP
150.0000 mg | Freq: Once | INTRAMUSCULAR | Status: AC
  Administered 2015-06-20: 150 mg via INTRAMUSCULAR

## 2015-06-20 NOTE — Progress Notes (Signed)
Pt here for Depo. Reports no problems at this time. Return in 12 weeks for next shot. JSY 

## 2015-07-03 ENCOUNTER — Encounter (HOSPITAL_COMMUNITY): Payer: Self-pay | Admitting: Emergency Medicine

## 2015-07-03 ENCOUNTER — Emergency Department (HOSPITAL_COMMUNITY)
Admission: EM | Admit: 2015-07-03 | Discharge: 2015-07-03 | Disposition: A | Discharge status: Home / Self Care | Payer: Medicaid Other | Attending: Emergency Medicine | Admitting: Emergency Medicine

## 2015-07-03 DIAGNOSIS — G8929 Other chronic pain: Secondary | ICD-10-CM | POA: Insufficient documentation

## 2015-07-03 DIAGNOSIS — Z793 Long term (current) use of hormonal contraceptives: Secondary | ICD-10-CM | POA: Diagnosis not present

## 2015-07-03 DIAGNOSIS — R112 Nausea with vomiting, unspecified: Secondary | ICD-10-CM

## 2015-07-03 DIAGNOSIS — Z720 Tobacco use: Secondary | ICD-10-CM | POA: Insufficient documentation

## 2015-07-03 LAB — CBC WITH DIFFERENTIAL/PLATELET
Basophils Absolute: 0 10*3/uL (ref 0.0–0.1)
Basophils Relative: 0 % (ref 0–1)
Eosinophils Absolute: 0 10*3/uL (ref 0.0–0.7)
Eosinophils Relative: 0 % (ref 0–5)
HCT: 41 % (ref 36.0–46.0)
HEMOGLOBIN: 14 g/dL (ref 12.0–15.0)
LYMPHS ABS: 1.6 10*3/uL (ref 0.7–4.0)
LYMPHS PCT: 18 % (ref 12–46)
MCH: 32.6 pg (ref 26.0–34.0)
MCHC: 34.1 g/dL (ref 30.0–36.0)
MCV: 95.6 fL (ref 78.0–100.0)
Monocytes Absolute: 0.4 10*3/uL (ref 0.1–1.0)
Monocytes Relative: 5 % (ref 3–12)
NEUTROS ABS: 6.6 10*3/uL (ref 1.7–7.7)
NEUTROS PCT: 77 % (ref 43–77)
PLATELETS: 308 10*3/uL (ref 150–400)
RBC: 4.29 MIL/uL (ref 3.87–5.11)
RDW: 14 % (ref 11.5–15.5)
WBC: 8.7 10*3/uL (ref 4.0–10.5)

## 2015-07-03 LAB — COMPREHENSIVE METABOLIC PANEL
ALBUMIN: 4.4 g/dL (ref 3.5–5.0)
ALK PHOS: 87 U/L (ref 38–126)
ALT: 18 U/L (ref 14–54)
ANION GAP: 11 (ref 5–15)
AST: 24 U/L (ref 15–41)
BILIRUBIN TOTAL: 0.6 mg/dL (ref 0.3–1.2)
BUN: 11 mg/dL (ref 6–20)
CO2: 23 mmol/L (ref 22–32)
Calcium: 8.5 mg/dL — ABNORMAL LOW (ref 8.9–10.3)
Chloride: 112 mmol/L — ABNORMAL HIGH (ref 101–111)
Creatinine, Ser: 0.65 mg/dL (ref 0.44–1.00)
GFR calc Af Amer: >60 mL/min (ref >60)
GFR calc non Af Amer: >60 mL/min (ref >60)
Glucose, Bld: 88 mg/dL (ref 65–99)
Potassium: 3.4 mmol/L — ABNORMAL LOW (ref 3.5–5.1)
Sodium: 146 mmol/L — ABNORMAL HIGH (ref 135–145)
TOTAL PROTEIN: 7.8 g/dL (ref 6.5–8.1)

## 2015-07-03 MED ORDER — ONDANSETRON HCL 4 MG/2ML IJ SOLN
4.0000 mg | Freq: Once | INTRAMUSCULAR | Status: AC
  Administered 2015-07-03: 4 mg via INTRAVENOUS
  Filled 2015-07-03: qty 2

## 2015-07-03 MED ORDER — SODIUM CHLORIDE 0.9 % IV BOLUS (SEPSIS)
1000.0000 mL | Freq: Once | INTRAVENOUS | Status: AC
  Administered 2015-07-03: 1000 mL via INTRAVENOUS

## 2015-07-03 MED ORDER — PROMETHAZINE HCL 25 MG PO TABS: 25.0000 mg | ORAL_TABLET | Freq: Four times a day (QID) | ORAL | Status: DC | PRN

## 2015-07-03 NOTE — Discharge Instructions (Signed)
Medication for nausea.  Clear liquids.  Rest. °

## 2015-07-03 NOTE — ED Notes (Signed)
Patient c/o vomiting that started this morning. Denies any fevers, diarrhea, or urinary symptoms. Per patient lower back pain and abd cramping from vomiting. Patient actively vomiting in triage. Chills noted-afebrile.

## 2015-07-03 NOTE — ED Provider Notes (Signed)
CSN: [id number]     Arrival date & time 07/03/15  1458 History   First MD Initiated Contact with Patient 07/03/15 1555     Chief Complaint  Patient presents with  . Emesis     (Consider location/radiation/quality/duration/timing/severity/associated sxs/prior Treatment) HPI.... Nausea and vomiting since 8 AM this morning without diarrhea. No fever, sweats, chills, dysuria. Patient has "chronic abdominal pain". Severity is mild. Nothing makes symptoms better or worse.  Past Medical History  Diagnosis Date  . Chronic headache   . Chronic abdominal pain   . Nausea and vomiting     recurrent   Past Surgical History  Procedure Laterality Date  . Elective abortion     History reviewed. No pertinent family history. History  Substance Use Topics  . Smoking status: Current Every Day Smoker -- 0.25 packs/day for 3 years    Types: Cigarettes  . Smokeless tobacco: Never Used  . Alcohol Use: No   OB History    Gravida Para Term Preterm AB TAB SAB Ectopic Multiple Living   1 1 1       1      Review of Systems  All other systems reviewed and are negative.     Allergies  Review of patient's allergies indicates no known allergies.  Home Medications   Prior to Admission medications   Medication Sig Start Date End Date Taking? Authorizing Provider  medroxyPROGESTERone (DEPO-PROVERA) 150 MG/ML injection INJECT 1 ML INTRAMUSCULARLY ONCE EVERY 3 MONTHS. 06/16/15  Yes Adline PotterJennifer A Griffin, NP  promethazine (PHENERGAN) 25 MG tablet Take 1 tablet (25 mg total) by mouth every 6 (six) hours as needed. 07/03/15   Donnetta HutchingBrian Elvert Cumpton, MD   BP 144/96 mmHg  Pulse 97  Temp(Src) 98.4 F (36.9 C) (Oral)  Resp 16  Ht 5' (1.524 m)  Wt 135 lb (61.236 kg)  BMI 26.37 kg/m2  SpO2 100% Physical Exam  Constitutional: She is oriented to person, place, and time. She appears well-developed and well-nourished.  HENT:  Head: Normocephalic and atraumatic.  Eyes: Conjunctivae and EOM are normal. Pupils are equal,  round, and reactive to light.  Neck: Normal range of motion. Neck supple.  Cardiovascular: Normal rate and regular rhythm.   Pulmonary/Chest: Effort normal and breath sounds normal.  Abdominal: Soft. Bowel sounds are normal.  Musculoskeletal: Normal range of motion.  Neurological: She is alert and oriented to person, place, and time.  Skin: Skin is warm and dry.  Psychiatric: She has a normal mood and affect. Her behavior is normal.  Nursing note and vitals reviewed.   ED Course  Procedures (including critical care time) Labs Review Labs Reviewed  COMPREHENSIVE METABOLIC PANEL - Abnormal; Notable for the following:    Sodium 146 (*)    Potassium 3.4 (*)    Chloride 112 (*)    Calcium 8.5 (*)    All other components within normal limits  CBC WITH DIFFERENTIAL/PLATELET  URINALYSIS, ROUTINE W REFLEX MICROSCOPIC (NOT [facility])    Imaging Review No results found.   EKG Interpretation None      MDM   Final diagnoses:  Non-intractable vomiting with nausea, vomiting of unspecified type    Patient feels much better after 2 L of IV fluids and IV Zofran. Discharge medications Phenergan 25 mg.    Donnetta HutchingBrian Rosalie Gelpi, MD 07/03/15 [phone]

## 2015-07-31 ENCOUNTER — Emergency Department (HOSPITAL_COMMUNITY)
Admission: EM | Admit: 2015-07-31 | Discharge: 2015-07-31 | Disposition: A | Discharge status: Home / Self Care | Payer: Medicaid Other | Attending: Emergency Medicine | Admitting: Emergency Medicine

## 2015-07-31 DIAGNOSIS — G8929 Other chronic pain: Secondary | ICD-10-CM | POA: Diagnosis not present

## 2015-07-31 DIAGNOSIS — R112 Nausea with vomiting, unspecified: Secondary | ICD-10-CM | POA: Insufficient documentation

## 2015-07-31 DIAGNOSIS — R1084 Generalized abdominal pain: Secondary | ICD-10-CM | POA: Diagnosis not present

## 2015-07-31 DIAGNOSIS — Z720 Tobacco use: Secondary | ICD-10-CM | POA: Insufficient documentation

## 2015-07-31 DIAGNOSIS — Z3202 Encounter for pregnancy test, result negative: Secondary | ICD-10-CM | POA: Insufficient documentation

## 2015-07-31 LAB — CBC WITH DIFFERENTIAL/PLATELET
Basophils Absolute: 0 10*3/uL (ref 0.0–0.1)
Basophils Relative: 0 % (ref 0–1)
Eosinophils Absolute: 0 10*3/uL (ref 0.0–0.7)
Eosinophils Relative: 0 % (ref 0–5)
HCT: 42.7 % (ref 36.0–46.0)
Hemoglobin: 14.7 g/dL (ref 12.0–15.0)
Lymphocytes Relative: 27 % (ref 12–46)
Lymphs Abs: 2.1 10*3/uL (ref 0.7–4.0)
MCH: 32.9 pg (ref 26.0–34.0)
MCHC: 34.4 g/dL (ref 30.0–36.0)
MCV: 95.5 fL (ref 78.0–100.0)
Monocytes Absolute: 0.4 10*3/uL (ref 0.1–1.0)
Monocytes Relative: 5 % (ref 3–12)
Neutro Abs: 5.2 10*3/uL (ref 1.7–7.7)
Neutrophils Relative %: 68 % (ref 43–77)
Platelets: 305 10*3/uL (ref 150–400)
RBC: 4.47 MIL/uL (ref 3.87–5.11)
RDW: 13.6 % (ref 11.5–15.5)
WBC: 7.7 10*3/uL (ref 4.0–10.5)

## 2015-07-31 LAB — BASIC METABOLIC PANEL
Anion gap: 12 (ref 5–15)
BUN: 10 mg/dL (ref 6–20)
CO2: 22 mmol/L (ref 22–32)
Calcium: 8.6 mg/dL — ABNORMAL LOW (ref 8.9–10.3)
Chloride: 109 mmol/L (ref 101–111)
Creatinine, Ser: 0.71 mg/dL (ref 0.44–1.00)
GFR calc Af Amer: >60 mL/min (ref >60)
GFR calc non Af Amer: >60 mL/min (ref >60)
Glucose, Bld: 137 mg/dL — ABNORMAL HIGH (ref 65–99)
Potassium: 3.5 mmol/L (ref 3.5–5.1)
Sodium: 143 mmol/L (ref 135–145)

## 2015-07-31 LAB — PREGNANCY, URINE: Preg Test, Ur: NEGATIVE

## 2015-07-31 MED ORDER — ONDANSETRON HCL 4 MG/2ML IJ SOLN
4.0000 mg | Freq: Once | INTRAMUSCULAR | Status: AC
  Administered 2015-07-31: 4 mg via INTRAVENOUS
  Filled 2015-07-31: qty 2

## 2015-07-31 MED ORDER — SODIUM CHLORIDE 0.9 % IV BOLUS (SEPSIS)
1000.0000 mL | Freq: Once | INTRAVENOUS | Status: AC
  Administered 2015-07-31: 1000 mL via INTRAVENOUS

## 2015-07-31 NOTE — Discharge Instructions (Signed)

## 2015-08-07 NOTE — ED Provider Notes (Signed)
CSN: [id number]     Arrival date & time 07/31/15  4098 History   First MD Initiated Contact with Patient 07/31/15 [phone]     Chief Complaint  Patient presents with  . Emesis    onset was 2200.     (Consider location/radiation/quality/duration/timing/severity/associated sxs/prior Treatment) HPI   29-year-old female with abdominal pain and nausea/vomiting. Onset tonight. She reports she has been drinking earlier. Has a history of "chronic abdominal pain." Symptoms feel somewhat similar to that. Pain is diffuse and crampy. Worsening upper abdomen. Doesn't particularly lateralize. Constant. Nonbilious/nonbloody vomiting. No coffee grounds. No diarrhea. No sick contacts. No fevers or chills. No chest pain or shortness of breath. No urinary complaints.  Past Medical History  Diagnosis Date  . Chronic headache   . Chronic abdominal pain   . Nausea and vomiting     recurrent   Past Surgical History  Procedure Laterality Date  . Elective abortion     No family history on file. Social History  Substance Use Topics  . Smoking status: Current Every Day Smoker -- 0.25 packs/day for 3 years    Types: Cigarettes  . Smokeless tobacco: Never Used  . Alcohol Use: No   OB History    Gravida Para Term Preterm AB TAB SAB Ectopic Multiple Living   1 1 1       1      Review of Systems  All systems reviewed and negative, other than as noted in HPI.   Allergies  Review of patient's allergies indicates no known allergies.  Home Medications   Prior to Admission medications   Medication Sig Start Date End Date Taking? Authorizing Provider  medroxyPROGESTERone (DEPO-PROVERA) 150 MG/ML injection INJECT 1 ML INTRAMUSCULARLY ONCE EVERY 3 MONTHS. 06/16/15  Yes Adline Potter, NP  promethazine (PHENERGAN) 25 MG tablet Take 1 tablet (25 mg total) by mouth every 6 (six) hours as needed. 07/03/15   Donnetta Hutching, MD   BP 140/97 mmHg  Pulse 98  Temp(Src) 97.6 F (36.4 C) (Oral)  Resp 20  Wt 135 lb  (61.236 kg)  SpO2 100% Physical Exam  Constitutional: She appears well-developed and well-nourished. No distress.  Laying in bed on side. Appears mildly uncomfortable, but not toxic.  HENT:  Head: Normocephalic and atraumatic.  Eyes: Conjunctivae are normal. Right eye exhibits no discharge. Left eye exhibits no discharge.  Neck: Neck supple.  Cardiovascular: Normal rate, regular rhythm and normal heart sounds.  Exam reveals no gallop and no friction rub.   No murmur heard. Pulmonary/Chest: Effort normal and breath sounds normal. No respiratory distress.  Abdominal: Soft. She exhibits no distension. There is no tenderness.  Musculoskeletal: She exhibits no edema or tenderness.  Neurological: She is alert.  Skin: Skin is warm and dry.  Psychiatric: She has a normal mood and affect. Her behavior is normal. Thought content normal.  Nursing note and vitals reviewed.   ED Course  Procedures (including critical care time) Labs Review Labs Reviewed  BASIC METABOLIC PANEL - Abnormal; Notable for the following:    Glucose, Bld 137 (*)    Calcium 8.6 (*)    All other components within normal limits  PREGNANCY, URINE  CBC WITH DIFFERENTIAL/PLATELET    Imaging Review No results found. I, Espn Zeman, personally reviewed and evaluated these images and lab results as part of my medical decision-making.   EKG Interpretation None      MDM   Final diagnoses:  Non-intractable vomiting with nausea, vomiting of unspecified type  29 year old female with nausea and vomiting. May be alcohol induced. Treated symptomatically with significant improvement of symptoms. Labs pretty unremarkable. Abdominal exam is benign. Afebrile. He may need weekly stable. Low suspicion for emergent process. I feel she is appropriate for discharge at this time. Return precautions were discussed.    Raeford Razor, MD 08/07/15 [phone]

## 2015-08-08 ENCOUNTER — Encounter: Payer: Medicaid Other | Admitting: Family Medicine

## 2015-08-08 ENCOUNTER — Encounter: Payer: Self-pay | Admitting: *Deleted

## 2015-09-12 ENCOUNTER — Encounter: Payer: Self-pay | Admitting: *Deleted

## 2015-09-12 ENCOUNTER — Ambulatory Visit: Payer: Medicaid Other

## 2015-09-12 ENCOUNTER — Ambulatory Visit (INDEPENDENT_AMBULATORY_CARE_PROVIDER_SITE_OTHER): Payer: Medicaid Other | Admitting: *Deleted

## 2015-09-12 DIAGNOSIS — Z3202 Encounter for pregnancy test, result negative: Secondary | ICD-10-CM | POA: Diagnosis not present

## 2015-09-12 DIAGNOSIS — Z3042 Encounter for surveillance of injectable contraceptive: Secondary | ICD-10-CM

## 2015-09-12 LAB — POCT URINE PREGNANCY: PREG TEST UR: NEGATIVE

## 2015-09-12 MED ORDER — MEDROXYPROGESTERONE ACETATE 150 MG/ML IM SUSP
150.0000 mg | Freq: Once | INTRAMUSCULAR | Status: AC
  Administered 2015-09-12: 150 mg via INTRAMUSCULAR

## 2015-09-12 NOTE — Progress Notes (Signed)
Pt here for Depo. Reports having cramps when shot is due and then cramps will eventually stop. Return in 12 weeks for next shot. JSY

## 2015-10-19 ENCOUNTER — Encounter: Payer: Self-pay | Admitting: *Deleted

## 2015-10-19 ENCOUNTER — Ambulatory Visit: Payer: Medicaid Other | Admitting: Family Medicine

## 2015-12-05 ENCOUNTER — Ambulatory Visit (INDEPENDENT_AMBULATORY_CARE_PROVIDER_SITE_OTHER): Payer: Medicaid Other | Admitting: *Deleted

## 2015-12-05 ENCOUNTER — Encounter: Payer: Self-pay | Admitting: *Deleted

## 2015-12-05 DIAGNOSIS — Z3202 Encounter for pregnancy test, result negative: Secondary | ICD-10-CM | POA: Diagnosis not present

## 2015-12-05 DIAGNOSIS — Z3042 Encounter for surveillance of injectable contraceptive: Secondary | ICD-10-CM

## 2015-12-05 LAB — POCT URINE PREGNANCY: PREG TEST UR: NEGATIVE

## 2015-12-05 MED ORDER — MEDROXYPROGESTERONE ACETATE 150 MG/ML IM SUSP
150.0000 mg | Freq: Once | INTRAMUSCULAR | Status: AC
  Administered 2015-12-05: 150 mg via INTRAMUSCULAR

## 2015-12-05 NOTE — Progress Notes (Signed)
Pt here for Depo. Reports no problems at this time. Return in 12 weeks for next shot. JSY 

## 2016-01-02 ENCOUNTER — Ambulatory Visit: Payer: Medicaid Other | Admitting: Family Medicine

## 2016-02-27 ENCOUNTER — Ambulatory Visit: Payer: Medicaid Other

## 2016-02-28 ENCOUNTER — Ambulatory Visit: Payer: Medicaid Other

## 2016-03-02 ENCOUNTER — Encounter: Payer: Self-pay | Admitting: *Deleted

## 2016-03-02 ENCOUNTER — Ambulatory Visit (INDEPENDENT_AMBULATORY_CARE_PROVIDER_SITE_OTHER): Payer: Medicaid Other | Admitting: *Deleted

## 2016-03-02 DIAGNOSIS — Z3042 Encounter for surveillance of injectable contraceptive: Secondary | ICD-10-CM

## 2016-03-02 DIAGNOSIS — Z3202 Encounter for pregnancy test, result negative: Secondary | ICD-10-CM

## 2016-03-02 LAB — POCT URINE PREGNANCY: Preg Test, Ur: NEGATIVE

## 2016-03-02 MED ORDER — MEDROXYPROGESTERONE ACETATE 150 MG/ML IM SUSP
150.0000 mg | Freq: Once | INTRAMUSCULAR | Status: AC
  Administered 2016-03-02: 150 mg via INTRAMUSCULAR

## 2016-03-02 NOTE — Progress Notes (Signed)
Pt here for Depo. Pt tolerated shot well. Return in 12 weeks for next shot. JSY 

## 2016-03-21 ENCOUNTER — Ambulatory Visit: Payer: Medicaid Other | Admitting: Family Medicine

## 2016-05-22 ENCOUNTER — Other Ambulatory Visit: Payer: Self-pay | Admitting: Adult Health

## 2016-05-25 ENCOUNTER — Ambulatory Visit: Payer: Medicaid Other

## 2016-05-25 ENCOUNTER — Encounter: Payer: Self-pay | Admitting: Obstetrics & Gynecology

## 2016-05-28 ENCOUNTER — Encounter: Payer: Self-pay | Admitting: *Deleted

## 2016-05-28 ENCOUNTER — Ambulatory Visit (INDEPENDENT_AMBULATORY_CARE_PROVIDER_SITE_OTHER): Payer: Medicaid Other | Admitting: *Deleted

## 2016-05-28 DIAGNOSIS — Z3042 Encounter for surveillance of injectable contraceptive: Secondary | ICD-10-CM

## 2016-05-28 DIAGNOSIS — Z3202 Encounter for pregnancy test, result negative: Secondary | ICD-10-CM

## 2016-05-28 LAB — POCT URINE PREGNANCY: PREG TEST UR: NEGATIVE

## 2016-05-28 MED ORDER — MEDROXYPROGESTERONE ACETATE 150 MG/ML IM SUSP
150.0000 mg | Freq: Once | INTRAMUSCULAR | Status: AC
  Administered 2016-05-28: 150 mg via INTRAMUSCULAR

## 2016-05-28 NOTE — Progress Notes (Signed)
Pt here for Depo. Pt tolerated shot well. Return in 12 weeks for next shot. JSY 

## 2016-06-10 ENCOUNTER — Emergency Department (HOSPITAL_COMMUNITY)
Admission: EM | Admit: 2016-06-10 | Discharge: 2016-06-10 | Disposition: A | Discharge status: Home / Self Care | Payer: Medicaid Other | Attending: Emergency Medicine | Admitting: Emergency Medicine

## 2016-06-10 ENCOUNTER — Encounter (HOSPITAL_COMMUNITY): Payer: Self-pay | Admitting: Emergency Medicine

## 2016-06-10 DIAGNOSIS — J069 Acute upper respiratory infection, unspecified: Secondary | ICD-10-CM | POA: Insufficient documentation

## 2016-06-10 DIAGNOSIS — F17210 Nicotine dependence, cigarettes, uncomplicated: Secondary | ICD-10-CM | POA: Insufficient documentation

## 2016-06-10 DIAGNOSIS — J029 Acute pharyngitis, unspecified: Secondary | ICD-10-CM

## 2016-06-10 MED ORDER — ONDANSETRON HCL 4 MG PO TABS
4.0000 mg | ORAL_TABLET | Freq: Once | ORAL | Status: AC
  Administered 2016-06-10: 4 mg via ORAL
  Filled 2016-06-10: qty 1

## 2016-06-10 MED ORDER — MAGIC MOUTHWASH W/LIDOCAINE: 10.0000 mL | Freq: Three times a day (TID) | ORAL | Status: DC

## 2016-06-10 MED ORDER — IBUPROFEN 600 MG PO TABS: 600.0000 mg | ORAL_TABLET | Freq: Four times a day (QID) | ORAL | Status: DC | PRN

## 2016-06-10 MED ORDER — AMOXICILLIN 500 MG PO CAPS: 500.0000 mg | ORAL_CAPSULE | Freq: Three times a day (TID) | ORAL | Status: DC

## 2016-06-10 MED ORDER — IBUPROFEN 800 MG PO TABS
800.0000 mg | ORAL_TABLET | Freq: Once | ORAL | Status: AC
  Administered 2016-06-10: 800 mg via ORAL
  Filled 2016-06-10: qty 1

## 2016-06-10 MED ORDER — AMOXICILLIN 250 MG PO CAPS
500.0000 mg | ORAL_CAPSULE | Freq: Once | ORAL | Status: AC
  Administered 2016-06-10: 500 mg via ORAL
  Filled 2016-06-10: qty 2

## 2016-06-10 NOTE — ED Provider Notes (Signed)
CSN: [id number]     Arrival date & time 06/10/16  0816 History   First MD Initiated Contact with Patient 06/10/16 [phone]     Chief Complaint  Patient presents with  . Sore Throat     (Consider location/radiation/quality/duration/timing/severity/associated sxs/prior Treatment) Patient is a 30 y.o. female presenting with pharyngitis. The history is provided by the patient.  Sore Throat This is a new problem. The current episode started in the past 7 days. The problem occurs intermittently. The problem has been gradually worsening. Associated symptoms include headaches and a sore throat. Pertinent negatives include no chills, fever or rash. The symptoms are aggravated by swallowing. She has tried acetaminophen for the symptoms. The treatment provided no relief.    Past Medical History  Diagnosis Date  . Chronic headache   . Chronic abdominal pain   . Nausea and vomiting     recurrent   Past Surgical History  Procedure Laterality Date  . Elective abortion     History reviewed. No pertinent family history. Social History  Substance Use Topics  . Smoking status: Current Every Day Smoker -- 0.25 packs/day for 3 years    Types: Cigarettes  . Smokeless tobacco: Never Used  . Alcohol Use: No   OB History    Gravida Para Term Preterm AB TAB SAB Ectopic Multiple Living   Review of Systems  Constitutional: Negative for fever and chills.  HENT: Positive for sore throat.   Skin: Negative for rash.  Neurological: Positive for headaches.  All other systems reviewed and are negative.     Allergies  Review of patient's allergies indicates no known allergies.  Home Medications   Prior to Admission medications   Medication Sig Start Date End Date Taking? Authorizing Provider  MedroxyPROGESTERone Acetate 150 MG/ML SUSY INJECT 1 ML INTRAMUSCULARLY ONCE EVERY 3 MONTHS. 05/22/16   Adline Potter, NP   BP 140/86 mmHg  Pulse 64  Temp(Src) 98.7 F (37.1 C) (Oral)   Resp 18  Ht  (1.549 m)  Wt 54.432 kg  BMI 22.69 kg/m2  SpO2 100% Physical Exam  Constitutional: She is oriented to person, place, and time. She appears well-developed and well-nourished.  Non-toxic appearance.  HENT:  Head: Normocephalic.  Right Ear: Tympanic membrane and external ear normal.  Left Ear: Tympanic membrane and external ear normal.  Mouth/Throat: Uvula is midline. Uvula swelling present. Posterior oropharyngeal erythema present. No tonsillar abscesses.  Nasal congestion present.  Eyes: EOM and lids are normal. Pupils are equal, round, and reactive to light.  Neck: Normal range of motion. Neck supple. Carotid bruit is not present.  Cardiovascular: Normal rate, regular rhythm, normal heart sounds, intact distal pulses and normal pulses.   Pulmonary/Chest: Breath sounds normal. No respiratory distress.  Abdominal: Soft. Bowel sounds are normal. There is no tenderness. There is no guarding.  Musculoskeletal: Normal range of motion.  Lymphadenopathy:       Head (right side): No submandibular adenopathy present.       Head (left side): No submandibular adenopathy present.    She has no cervical adenopathy.  Neurological: She is alert and oriented to person, place, and time. She has normal strength. No cranial nerve deficit or sensory deficit.  Skin: Skin is warm and dry.  Psychiatric: She has a normal mood and affect. Her speech is normal.  Nursing note and vitals reviewed.   ED Course  Procedures (including critical care  time) Labs Review Labs Reviewed - No data to display  Imaging Review No results found. I have personally reviewed and evaluated these images and lab results as part of my medical decision-making.   EKG Interpretation None      MDM  Examination favors pharyngitis and upper respiratory infection. No unusual rash noted. No excessive vomiting or diarrhea reported. No evidence for dehydration this time.  Prescription for Amoxil, ibuprofen, and  Magic mouthwash with lidocaine given to the patient. She is to follow with her primary physician next week. She will return to the emergency department sooner if any emergent changes, problems, or concerns.    Final diagnoses:  None    **I have reviewed nursing notes, vital signs, and all appropriate lab and imaging results for this patient.Ivery Quale*    Jakory Matsuo, PA-C 06/10/16 2212  Donnetta HutchingBrian Cook, MD 06/11/16 [phone]

## 2016-06-10 NOTE — ED Notes (Signed)
Pt states she has had a sore throat for about a week.

## 2016-06-10 NOTE — Discharge Instructions (Signed)

## 2016-06-22 ENCOUNTER — Ambulatory Visit (INDEPENDENT_AMBULATORY_CARE_PROVIDER_SITE_OTHER): Payer: Medicaid Other | Admitting: Family Medicine

## 2016-06-22 ENCOUNTER — Encounter: Payer: Self-pay | Admitting: Family Medicine

## 2016-06-22 VITALS — BP 120/78 | HR 102 | Temp 98.3°F | Resp 16 | Ht 61.0 in | Wt 122.0 lb

## 2016-06-22 DIAGNOSIS — R51 Headache: Secondary | ICD-10-CM | POA: Diagnosis not present

## 2016-06-22 DIAGNOSIS — J309 Allergic rhinitis, unspecified: Secondary | ICD-10-CM

## 2016-06-22 DIAGNOSIS — J3089 Other allergic rhinitis: Secondary | ICD-10-CM

## 2016-06-22 DIAGNOSIS — F17200 Nicotine dependence, unspecified, uncomplicated: Secondary | ICD-10-CM

## 2016-06-22 DIAGNOSIS — E559 Vitamin D deficiency, unspecified: Secondary | ICD-10-CM

## 2016-06-22 DIAGNOSIS — J209 Acute bronchitis, unspecified: Secondary | ICD-10-CM | POA: Diagnosis not present

## 2016-06-22 DIAGNOSIS — J019 Acute sinusitis, unspecified: Secondary | ICD-10-CM | POA: Insufficient documentation

## 2016-06-22 DIAGNOSIS — J018 Other acute sinusitis: Secondary | ICD-10-CM | POA: Diagnosis not present

## 2016-06-22 DIAGNOSIS — R519 Headache, unspecified: Secondary | ICD-10-CM | POA: Insufficient documentation

## 2016-06-22 HISTORY — DX: Allergic rhinitis, unspecified: J30.9

## 2016-06-22 LAB — COMPREHENSIVE METABOLIC PANEL
ALBUMIN: 4.5 g/dL (ref 3.6–5.1)
ALK PHOS: 92 U/L (ref 33–115)
ALT: 19 U/L (ref 6–29)
AST: 21 U/L (ref 10–30)
BILIRUBIN TOTAL: 0.7 mg/dL (ref 0.2–1.2)
BUN: 7 mg/dL (ref 7–25)
CALCIUM: 9.6 mg/dL (ref 8.6–10.2)
CO2: 22 mmol/L (ref 20–31)
Chloride: 104 mmol/L (ref 98–110)
Creat: 0.73 mg/dL (ref 0.50–1.10)
GLUCOSE: 96 mg/dL (ref 65–99)
Potassium: 4.3 mmol/L (ref 3.5–5.3)
Sodium: 140 mmol/L (ref 135–146)
Total Protein: 7.4 g/dL (ref 6.1–8.1)

## 2016-06-22 LAB — CBC
HEMATOCRIT: 42 % (ref 35.0–45.0)
HEMOGLOBIN: 14.1 g/dL (ref 11.7–15.5)
MCH: 32.4 pg (ref 27.0–33.0)
MCHC: 33.6 g/dL (ref 32.0–36.0)
MCV: 96.6 fL (ref 80.0–100.0)
MPV: 9.9 fL (ref 7.5–12.5)
Platelets: 322 10*3/uL (ref 140–400)
RBC: 4.35 MIL/uL (ref 3.80–5.10)
RDW: 13.8 % (ref 11.0–15.0)
WBC: 6.3 10*3/uL (ref 3.8–10.8)

## 2016-06-22 LAB — TSH: TSH: 1.22 mIU/L

## 2016-06-22 MED ORDER — KETOROLAC TROMETHAMINE 60 MG/2ML IM SOLN
60.0000 mg | Freq: Once | INTRAMUSCULAR | Status: AC
  Administered 2016-06-22: 60 mg via INTRAMUSCULAR

## 2016-06-22 MED ORDER — MONTELUKAST SODIUM 10 MG PO TABS: 10.0000 mg | ORAL_TABLET | Freq: Every day | ORAL | Status: DC

## 2016-06-22 MED ORDER — FLUTICASONE PROPIONATE 50 MCG/ACT NA SUSP: 2.0000 | Freq: Every day | NASAL | Status: DC

## 2016-06-22 MED ORDER — CEFTRIAXONE SODIUM 1 G IJ SOLR
500.0000 mg | Freq: Once | INTRAMUSCULAR | Status: AC
  Administered 2016-06-22: 500 mg via INTRAMUSCULAR

## 2016-06-22 MED ORDER — BENZONATATE 100 MG PO CAPS: 100.0000 mg | ORAL_CAPSULE | Freq: Two times a day (BID) | ORAL | Status: DC | PRN

## 2016-06-22 MED ORDER — METHYLPREDNISOLONE ACETATE 80 MG/ML IJ SUSP
80.0000 mg | Freq: Once | INTRAMUSCULAR | Status: AC
  Administered 2016-06-22: 80 mg via INTRAMUSCULAR

## 2016-06-22 MED ORDER — PROMETHAZINE-DM 6.25-15 MG/5ML PO SYRP
ORAL_SOLUTION | ORAL | Status: DC
Start: 2016-06-22 — End: 2016-07-30

## 2016-06-22 MED ORDER — LEVOFLOXACIN 500 MG PO TABS: 500.0000 mg | ORAL_TABLET | Freq: Every day | ORAL | Status: AC

## 2016-06-22 NOTE — Progress Notes (Signed)
   Subjective:    Patient ID: Jaclyn Howe, female    DOB: 03/01/1986, 30 y.o.   MRN: [id number]  HPI  2 week h/o worsening head and chest congestion, associated with fever and chills intermittently. Nasal drainage has thickened , and is yellowish green, and at times bloody. Sputum is thick and yellow. C/o bilateral ear pressure, denies hearing loss and sore throat. Increasing fatigue , poor appetitie and sleep disturbed by cough. No improvement with OTC medication.   Review of Systems See HPI Patient alert and oriented and in no cardiopulmonary distress.  HEENT: No facial asymmetry, EOMI,   oropharynx pink and moist.  Neck supple no JVD, no mass. Sinu tenderness Chest: decreased air entry,few  crackles , no wheezes  CVS: S1, S2 no murmurs, no S3.Regular rate.  ABD: Soft non tender.   Ext: No edema  MS: Adequate ROM spine, shoulders, hips and knees.  Skin: Intact, no ulcerations or rash noted.  Psych: Good eye contact, normal affect. Memory intact not anxious or depressed appearing.  CNS: CN 2-12 intact, power,  normal throughout.no focal deficits noted.      Objective:   Physical Exam        Assessment & Plan:  Migraine Uncontrolled.Toradol and depo medrol administered IM in the office , to be followed by a short course of oral prednisone and NSAIDS.   Acute sinusitis Antibiotic and nasal spray prescribed  Acute bronchitis Decongestant and antibiotic prescribed, Rocephin administered also  Allergic rhinitis Uncontrolled, depo medrol in office and medication prescribed  NICOTINE ADDICTION Patient counseled for approximately 5 minutes regarding the health risks of ongoing nicotine use, specifically all types of cancer, heart disease, stroke and respiratory failure. The options available for help with cessation ,the behavioral changes to assist the process, and the option to either gradully reduce usage  Or abruptly stop.is also discussed. Pt is also  encouraged to set specific goals in number of cigarettes used daily, as well as to set a quit date.     Headache Uncontrolled, due to sinus pressure, toradol and depo medrol administered

## 2016-06-22 NOTE — Assessment & Plan Note (Signed)
Uncontrolled.Toradol and depo medrol administered IM in the office , to be followed by a short course of oral prednisone and NSAIDS.  

## 2016-06-22 NOTE — Patient Instructions (Signed)
Physical exam in 2.5 month, call if you need me soonner  Labs today  You are  Treated for acute sinusitis and bronchitis and uncontrolled allergies. Injections are given in the office and medications sent to your pharmacy  Labs today  Please work on cutting back from 15 to 10 cigarettes daily, you need to quit   You Can Quit Smoking If you are ready to quit smoking or are thinking about it, congratulations! You have chosen to help yourself be healthier and live longer! There are lots of different ways to quit smoking. Nicotine gum, nicotine patches, a nicotine inhaler, or nicotine nasal spray can help with physical craving. Hypnosis, support groups, and medicines help break the habit of smoking. TIPS TO GET OFF AND STAY OFF CIGARETTES  Learn to predict your moods. Do not let a bad situation be your excuse to have a cigarette. Some situations in your life might tempt you to have a cigarette.  Ask friends and co-workers not to smoke around you.  Make your home smoke-free.  Never have "just one" cigarette. It leads to wanting another and another. Remind yourself of your decision to quit.  On a card, make a list of your reasons for not smoking. Read it at least the same number of times a day as you have a cigarette. Tell yourself everyday, "I do not want to smoke. I choose not to smoke."  Ask someone at home or work to help you with your plan to quit smoking.  Have something planned after you eat or have a cup of coffee. Take a walk or get other exercise to perk you up. This will help to keep you from overeating.  Try a relaxation exercise to calm you down and decrease your stress. Remember, you may be tense and nervous the first two weeks after you quit. This will pass.  Find new activities to keep your hands busy. Play with a pen, coin, or rubber band. Doodle or draw things on paper.  Brush your teeth right after eating. This will help cut down the craving for the taste of tobacco  after meals. You can try mouthwash too.  Try gum, breath mints, or diet candy to keep something in your mouth. IF YOU SMOKE AND WANT TO QUIT:  Do not stock up on cigarettes. Never buy a carton. Wait until one pack is finished before you buy another.  Never carry cigarettes with you at work or at home.  Keep cigarettes as far away from you as possible. Leave them with someone else.  Never carry matches or a lighter with you.  Ask yourself, "Do I need this cigarette or is this just a reflex?"  Bet with someone that you can quit. Put cigarette money in a piggy bank every morning. If you smoke, you give up the money. If you do not smoke, by the end of the week, you keep the money.  Keep trying. It takes 21 days to change a habit!  Talk to your doctor about using medicines to help you quit. These include nicotine replacement gum, lozenges, or skin patches.   This information is not intended to replace advice given to you by your health care provider. Make sure you discuss any questions you have with your health care provider.   Document Released: 10/06/2009 Document Revised: 03/03/2012 Document Reviewed: 10/06/2009 Elsevier Interactive Patient Education [employer]2016 [employer] Inc.

## 2016-06-23 LAB — VITAMIN D 25 HYDROXY (VIT D DEFICIENCY, FRACTURES): Vit D, 25-Hydroxy: 15 ng/mL — ABNORMAL LOW (ref 30–100)

## 2016-06-24 NOTE — Assessment & Plan Note (Signed)
Uncontrolled, due to sinus pressure, toradol and depo medrol administered

## 2016-06-24 NOTE — Assessment & Plan Note (Signed)
Antibiotic and nasal spray prescribed

## 2016-06-24 NOTE — Assessment & Plan Note (Signed)

## 2016-06-24 NOTE — Assessment & Plan Note (Addendum)
Decongestant and antibiotic prescribed, Rocephin administered also

## 2016-06-24 NOTE — Assessment & Plan Note (Signed)
Uncontrolled, depo medrol in office and medication prescribed

## 2016-06-25 MED ORDER — VITAMIN D (ERGOCALCIFEROL) 1.25 MG (50000 UNIT) PO CAPS: 50000.0000 [IU] | ORAL_CAPSULE | ORAL | Status: DC

## 2016-06-25 NOTE — Addendum Note (Signed)
Addended by: Abner GreenspanHUDY, Kyria Bumgardner H on: 06/25/2016 02:22 PM   Modules accepted: Orders

## 2016-07-30 ENCOUNTER — Other Ambulatory Visit: Payer: Self-pay

## 2016-07-30 ENCOUNTER — Encounter: Payer: Self-pay | Admitting: Family Medicine

## 2016-07-30 ENCOUNTER — Encounter (INDEPENDENT_AMBULATORY_CARE_PROVIDER_SITE_OTHER): Payer: Self-pay

## 2016-07-30 ENCOUNTER — Ambulatory Visit (INDEPENDENT_AMBULATORY_CARE_PROVIDER_SITE_OTHER): Payer: Medicaid Other | Admitting: Family Medicine

## 2016-07-30 VITALS — BP 118/74 | HR 76 | Resp 16 | Ht 61.0 in | Wt 122.0 lb

## 2016-07-30 DIAGNOSIS — J3089 Other allergic rhinitis: Secondary | ICD-10-CM

## 2016-07-30 DIAGNOSIS — F17200 Nicotine dependence, unspecified, uncomplicated: Secondary | ICD-10-CM | POA: Diagnosis not present

## 2016-07-30 DIAGNOSIS — G43009 Migraine without aura, not intractable, without status migrainosus: Secondary | ICD-10-CM

## 2016-07-30 DIAGNOSIS — J320 Chronic maxillary sinusitis: Secondary | ICD-10-CM | POA: Diagnosis not present

## 2016-07-30 DIAGNOSIS — R0789 Other chest pain: Secondary | ICD-10-CM

## 2016-07-30 DIAGNOSIS — R079 Chest pain, unspecified: Secondary | ICD-10-CM | POA: Insufficient documentation

## 2016-07-30 MED ORDER — FLUTICASONE PROPIONATE 50 MCG/ACT NA SUSP: 2.0000 | Freq: Every day | NASAL | 3 refills | Status: DC

## 2016-07-30 MED ORDER — BUTALBITAL-APAP-CAFFEINE 50-325-40 MG PO TABS: ORAL_TABLET | ORAL | 3 refills | Status: DC

## 2016-07-30 MED ORDER — VITAMIN D (ERGOCALCIFEROL) 1.25 MG (50000 UNIT) PO CAPS: 50000.0000 [IU] | ORAL_CAPSULE | ORAL | 5 refills | Status: DC

## 2016-07-30 NOTE — Assessment & Plan Note (Addendum)
Uncontrolled , not responding to ibuprofen, fioricet prescribed with restriction on use, pt encouraged to identify and avoid triggers

## 2016-07-30 NOTE — Patient Instructions (Addendum)
F/u as before , call if you need me sooner  X ray of sinus today  New medication fioricet for headache  EKG shows no heart damage but you are referred to heart specialist because of symptoms  NEED TO STOP SMOKING     Smoking Hazards Smoking cigarettes is extremely bad for your health. Tobacco smoke has over 200 known poisons in it. It contains the poisonous gases nitrogen oxide and carbon monoxide. There are over 60 chemicals in tobacco smoke that cause cancer. Some of the chemicals found in cigarette smoke include:   Cyanide.   Benzene.   Formaldehyde.   Methanol (wood alcohol).   Acetylene (fuel used in welding torches).   Ammonia.  Even smoking lightly shortens your life expectancy by several years. You can greatly reduce the risk of medical problems for you and your family by stopping now. Smoking is the most preventable cause of death and disease in our society. Within days of quitting smoking, your circulation improves, you decrease the risk of having a heart attack, and your lung capacity improves. There may be some increased phlegm in the first few days after quitting, and it may take months for your lungs to clear up completely. Quitting for 10 years reduces your risk of developing lung cancer to almost that of a nonsmoker.  WHAT ARE THE RISKS OF SMOKING? Cigarette smokers have an increased risk of many serious medical problems, including:  Lung cancer.   Lung disease (such as pneumonia, bronchitis, and emphysema).   Heart attack and chest pain due to the heart not getting enough oxygen (angina).   Heart disease and peripheral blood vessel disease.   Hypertension.   Stroke.   Oral cancer (cancer of the lip, mouth, or voice box).   Bladder cancer.   Pancreatic cancer.   Cervical cancer.   Pregnancy complications, including premature birth.   Stillbirths and smaller newborn babies, birth defects, and genetic damage to sperm.   Early  menopause.   Lower estrogen level for women.   Infertility.   Facial wrinkles.   Blindness.   Increased risk of broken bones (fractures).   Senile dementia.   Stomach ulcers and internal bleeding.   Delayed wound healing and increased risk of complications during surgery. Because of secondhand smoke exposure, children of smokers have an increased risk of the following:   Sudden infant death syndrome (SIDS).   Respiratory infections.   Lung cancer.   Heart disease.   Ear infections.  WHY IS SMOKING ADDICTIVE? Nicotine is the chemical agent in tobacco that is capable of causing addiction or dependence. When you smoke and inhale, nicotine is absorbed rapidly into the bloodstream through your lungs. Both inhaled and noninhaled nicotine may be addictive.  WHAT ARE THE BENEFITS OF QUITTING?  There are many health benefits to quitting smoking. Some are:   The likelihood of developing cancer and heart disease decreases. Health improvements are seen almost immediately.   Blood pressure, pulse rate, and breathing patterns start returning to normal soon after quitting.   People who quit may see an improvement in their overall quality of life.  HOW DO YOU QUIT SMOKING? Smoking is an addiction with both physical and psychological effects, and longtime habits can be hard to change. Your health care provider can recommend:  Programs and community resources, which may include group support, education, or therapy.  Replacement products, such as patches, gum, and nasal sprays. Use these products only as directed. Do not replace cigarette smoking with electronic  cigarettes (commonly called e-cigarettes). The safety of e-cigarettes is unknown, and some may contain harmful chemicals. FOR MORE INFORMATION  American Lung Association: www.lung.org  American Cancer Society: www.cancer.org   This information is not intended to replace advice given to you by your health care  provider. Make sure you discuss any questions you have with your health care provider.   Document Released: 01/17/2005 Document Revised: 09/30/2013 Document Reviewed: 06/01/2013 Elsevier Interactive Patient Education 2016 [employer].  You Can Quit Smoking If you are ready to quit smoking or are thinking about it, congratulations! You have chosen to help yourself be healthier and live longer! There are lots of different ways to quit smoking. Nicotine gum, nicotine patches, a nicotine inhaler, or nicotine nasal spray can help with physical craving. Hypnosis, support groups, and medicines help break the habit of smoking. TIPS TO GET OFF AND STAY OFF CIGARETTES  Learn to predict your moods. Do not let a bad situation be your excuse to have a cigarette. Some situations in your life might tempt you to have a cigarette.  Ask friends and co-workers not to smoke around you.  Make your home smoke-free.  Never have "just one" cigarette. It leads to wanting another and another. Remind yourself of your decision to quit.  On a card, make a list of your reasons for not smoking. Read it at least the same number of times a day as you have a cigarette. Tell yourself everyday, "I do not want to smoke. I choose not to smoke."  Ask someone at home or work to help you with your plan to quit smoking.  Have something planned after you eat or have a cup of coffee. Take a walk or get other exercise to perk you up. This will help to keep you from overeating.  Try a relaxation exercise to calm you down and decrease your stress. Remember, you may be tense and nervous the first two weeks after you quit. This will pass.  Find new activities to keep your hands busy. Play with a pen, coin, or rubber band. Doodle or draw things on paper.  Brush your teeth right after eating. This will help cut down the craving for the taste of tobacco after meals. You can try mouthwash too.  Try gum, breath mints, or diet candy to  keep something in your mouth. IF YOU SMOKE AND WANT TO QUIT:  Do not stock up on cigarettes. Never buy a carton. Wait until one pack is finished before you buy another.  Never carry cigarettes with you at work or at home.  Keep cigarettes as far away from you as possible. Leave them with someone else.  Never carry matches or a lighter with you.  Ask yourself, "Do I need this cigarette or is this just a reflex?"  Bet with someone that you can quit. Put cigarette money in a piggy bank every morning. If you smoke, you give up the money. If you do not smoke, by the end of the week, you keep the money.  Keep trying. It takes 21 days to change a habit!  Talk to your doctor about using medicines to help you quit. These include nicotine replacement gum, lozenges, or skin patches.   This information is not intended to replace advice given to you by your health care provider. Make sure you discuss any questions you have with your health care provider.   Document Released: 10/06/2009 Document Revised: 03/03/2012 Document Reviewed: 10/06/2009 Elsevier Interactive Patient Education 2016 Elsevier  Inc.  

## 2016-07-30 NOTE — Assessment & Plan Note (Signed)
Patient counseled for approximately 5 minutes regarding the health risks of ongoing nicotine use, specifically all types of cancer, heart disease, stroke and respiratory failure. The options available for help with cessation ,the behavioral changes to assist the process, and the option to either gradully reduce usage  Or abruptly stop.is also discussed. Pt is also encouraged to set specific goals in number of cigarettes used daily, as well as to set a quit date. Current is 3 per day

## 2016-07-30 NOTE — Progress Notes (Signed)
   Louanne BeltonShameeka Stefan     MRN: [id number]      DOB: 07/05/1986   HPI Ms. Villada is here with a 2 week h/o of localized left chest pain up to a 10,  Happened twice last week, duration up to 15 mins, non radiaiting, accompanied by diaphoresis. Smoke cigarettes, no known fam h/oheart disease. Occurs with activity and at rest Left sided periorbital headache with green nasal drainage x 1 week, chills x 1 week, recently completed antibiotic course for maxillary sinus infection Migraine headaches have increased  In frequency and severity in The past week not responding to ibuprofen  ROS Denies recent fever  Denies  ear pain or sore throat. Denies chest congestion, productive cough or wheezing. Denies PND , orhtopnea, palpitations and leg swelling Denies abdominal pain, nausea, vomiting,diarrhea or constipation.   Denies dysuria, frequency, hesitancy or incontinence. Denies joint pain, swelling and limitation in mobility. Denies, seizures, numbness, or tingling. Denies depression, does have anxiety, denies insomnia.   PE  BP 118/74   Pulse 76   Resp 16   Ht 5\' 1"  (1.549 m)   Wt 122 lb (55.3 kg)   SpO2 100%   BMI 23.05 kg/m   Patient alert and oriented and in no cardiopulmonary distress.  HEENT: No facial asymmetry, EOMI,   oropharynx pink and moist.  Neck supple no JVD, no mass.Left maxillary sinus tenderness  Chest: Clear to auscultation bilaterally.Decreased air entry CVS: S1, S2 no murmurs, no S3.Regular rate.  ABD: Soft non tender.   Ext: No edema  MS: Adequate ROM spine, shoulders, hips and knees.  Skin: Intact, no ulcerations or rash noted.  Psych: Good eye contact, normal affect. Memory intact mildly anxious, not depressed  CNS: CN 2-12 intact, power,  normal throughout.no focal deficits noted.   Assessment & Plan  Left maxillary sinusitis 1 week h/o left maxillary pressure with chills and drainage, recently completed antibiotic course , needs imaging of sinus to  further evaluate   NICOTINE ADDICTION Patient counseled for approximately 5 minutes regarding the health risks of ongoing nicotine use, specifically all types of cancer, heart disease, stroke and respiratory failure. The options available for help with cessation ,the behavioral changes to assist the process, and the option to either gradully reduce usage  Or abruptly stop.is also discussed. Pt is also encouraged to set specific goals in number of cigarettes used daily, as well as to set a quit date. Current is 3 per day    Migraine headache Uncontrolled , not responding to ibuprofen, fioricet prescribed with restriction on use, pt encouraged to identify and avoid triggers  Chest pain New onset chest discomfort, accompanied by light headedness and sweating, not aggravated by physical activity. Pt smokes nicotine. Office EKG: sinus rhythm with no ischemic changes will stillerefer to cardiology due reported concern by p t that she felt as though she was "leaving here"

## 2016-07-30 NOTE — Assessment & Plan Note (Addendum)
1 week h/o left maxillary pressure with chills and drainage, recently completed antibiotic course , needs imaging of sinus to further evaluate

## 2016-08-05 ENCOUNTER — Encounter: Payer: Self-pay | Admitting: Family Medicine

## 2016-08-05 NOTE — Assessment & Plan Note (Signed)
New onset chest discomfort, accompanied by light headedness and sweating, not aggravated by physical activity. Pt smokes nicotine. Office EKG: sinus rhythm with no ischemic changes will stillerefer to cardiology due reported concern by p t that she felt as though she was "leaving here"

## 2016-08-13 ENCOUNTER — Encounter: Payer: Self-pay | Admitting: Cardiology

## 2016-08-13 NOTE — Progress Notes (Deleted)
Cardiology Office Note  Date: 08/13/2016   ID: [name], DOB 12/08/1986, MRN [id number]  PCP: Syliva OvermanMargaret Simpson, MD  Consulting Cardiologist: Nona DellSamuel Itzel Lowrimore, MD   No chief complaint on file.   History of Present Illness: Jaclyn Howe is a 30 y.o. female referred for cardiology consultation by Dr. Lodema HongSimpson.  Past Medical History:  Diagnosis Date  . Chronic abdominal pain   . Chronic headache   . Nausea and vomiting    Recurrent    Past Surgical History:  Procedure Laterality Date  . ELECTIVE ABORTION      Current Outpatient Prescriptions  Medication Sig Dispense Refill  . butalbital-acetaminophen-caffeine (FIORICET) 50-325-40 MG tablet One tablet twice daily, maximum twice weekly for migraine headache 16 tablet 3  . fluticasone (FLONASE) 50 MCG/ACT nasal spray Place 2 sprays into both nostrils daily. 16 g 3  . MedroxyPROGESTERone Acetate 150 MG/ML SUSY INJECT 1 ML INTRAMUSCULARLY ONCE EVERY 3 MONTHS. 1 mL 3  . montelukast (SINGULAIR) 10 MG tablet Take 1 tablet (10 mg total) by mouth at bedtime. 30 tablet 3  . Vitamin D, Ergocalciferol, (DRISDOL) 50000 units CAPS capsule Take 1 capsule (50,000 Units total) by mouth every 7 (seven) days. 4 capsule 5   No current facility-administered medications for this visit.    Allergies:  Review of patient's allergies indicates no known allergies.   Social History: The patient  reports that she has been smoking Cigarettes.  She has a 0.75 pack-year smoking history. She has never used smokeless tobacco. She reports that she does not drink alcohol or use drugs.   Family History: The patient's family history is not on file.   ROS:  Please see the history of present illness. Otherwise, complete review of systems is positive for {NONE DEFAULTED:18576::"none"}.  All other systems are reviewed and negative.   Physical Exam: VS:  There were no vitals taken for this visit., BMI There is no height or weight on file to calculate  BMI.  Wt Readings from Last 3 Encounters:  07/30/16 122 lb (55.3 kg)  06/22/16 122 lb (55.3 kg)  06/10/16 120 lb (54.4 kg)    General: Patient appears comfortable at rest. HEENT: Conjunctiva and lids normal, oropharynx clear with moist mucosa. Neck: Supple, no elevated JVP or carotid bruits, no thyromegaly. Lungs: Clear to auscultation, nonlabored breathing at rest. Cardiac: Regular rate and rhythm, no S3 or significant systolic murmur, no pericardial rub. Abdomen: Soft, nontender, no hepatomegaly, bowel sounds present, no guarding or rebound. Extremities: No pitting edema, distal pulses 2+. Skin: Warm and dry. Musculoskeletal: No kyphosis. Neuropsychiatric: Alert and oriented x3, affect grossly appropriate.  ECG: I personally reviewed the tracing from 07/30/2016 which showed normal sinus rhythm with decreased anteroseptal R-wave progression.  Recent Labwork: 06/22/2016: ALT 19; AST 21; BUN 7; Creat 0.73; Hemoglobin 14.1; Platelets 322; Potassium 4.3; Sodium 140; TSH 1.22     Component Value Date/Time   CHOL 147 11/14/2009 2011   TRIG 95 11/14/2009 2011   HDL 70 11/14/2009 2011   CHOLHDL 2.1 Ratio 11/14/2009 2011   VLDL 19 11/14/2009 2011   LDLCALC 58 11/14/2009 2011    Other Studies Reviewed Today:  Abdominal and pelvic CT 03/05/2013: Findings: Lung bases are clear.  No effusions.  Heart is normal size.  Liver, gallbladder, spleen, pancreas, adrenals and kidneys are unremarkable.  Appendix is visualized and is normal.  Uterus, adnexa urinary bladder grossly unremarkable.  Small bowel decompressed.  Stomach and colon grossly unremarkable.  No free fluid, free air  or adenopathy.  Aorta is normal caliber.  No acute bony abnormality.  IMPRESSION: No acute findings in the abdomen or pelvis.  Assessment and Plan:   Current medicines were reviewed with the patient today.  No orders of the defined types were placed in this encounter.   Disposition:  Signed, Jonelle SidleSamuel  G. Davarion Cuffee, MD, [facility] 08/13/2016 1:50 PM    [facility] [address], [place], [place] [address] Phone: [phone]; Fax: [phone]

## 2016-08-14 ENCOUNTER — Ambulatory Visit: Payer: Medicaid Other | Admitting: Cardiology

## 2016-08-20 ENCOUNTER — Ambulatory Visit (INDEPENDENT_AMBULATORY_CARE_PROVIDER_SITE_OTHER): Payer: Medicaid Other | Admitting: *Deleted

## 2016-08-20 DIAGNOSIS — Z3042 Encounter for surveillance of injectable contraceptive: Secondary | ICD-10-CM | POA: Diagnosis not present

## 2016-08-20 DIAGNOSIS — Z3202 Encounter for pregnancy test, result negative: Secondary | ICD-10-CM

## 2016-08-20 LAB — POCT URINE PREGNANCY: PREG TEST UR: NEGATIVE

## 2016-08-20 MED ORDER — MEDROXYPROGESTERONE ACETATE 150 MG/ML IM SUSP
150.0000 mg | Freq: Once | INTRAMUSCULAR | Status: AC
  Administered 2016-08-20: 150 mg via INTRAMUSCULAR

## 2016-08-20 NOTE — Progress Notes (Signed)
Depo Provera 150 mg IM given right deltoid with no complications, negative pregnancy test. Pt to return in 12 weeks for next injection. 

## 2016-08-21 NOTE — Progress Notes (Deleted)
Cardiology Office Note   Date:  08/21/2016   ID:  Dorathy DaftShameeka Durbin, DOB 10/13/1986, MRN [id number]  PCP:  Syliva OvermanMargaret Simpson, MD  Cardiologist:   Charlton HawsPeter Daiden Coltrane, MD   No chief complaint on file.     History of Present Illness: Jaclyn BeltonShameeka Howe is a 30 y.o. female who presents for evaluation of atypical chest pain  Ms. Broadhead has had a  2 week h/o of localized left chest pain up to a 10 Started end of July,  Happened twice first week in August , duration up to 15 mins, non radiaiting, accompanied by diaphoresis. Smokes cigarettes, no known fam h/oheart disease. Occurs with activity and at rest  Past Medical History:  Diagnosis Date  . Chronic abdominal pain   . Chronic headache   . Nausea and vomiting    Recurrent    Past Surgical History:  Procedure Laterality Date  . ELECTIVE ABORTION       Current Outpatient Prescriptions  Medication Sig Dispense Refill  . butalbital-acetaminophen-caffeine (FIORICET) 50-325-40 MG tablet One tablet twice daily, maximum twice weekly for migraine headache 16 tablet 3  . MedroxyPROGESTERone Acetate 150 MG/ML SUSY INJECT 1 ML INTRAMUSCULARLY ONCE EVERY 3 MONTHS. 1 mL 3   No current facility-administered medications for this visit.     Allergies:   Review of patient's allergies indicates no known allergies.    Social History:  The patient  reports that she has been smoking Cigarettes.  She has a 0.75 pack-year smoking history. She has never used smokeless tobacco. She reports that she does not drink alcohol or use drugs.   Family History:  The patient's family history is not on file.    ROS:  Please see the history of present illness.   Otherwise, review of systems are positive for none.   All other systems are reviewed and negative.    PHYSICAL EXAM: VS:  There were no vitals taken for this visit. , BMI There is no height or weight on file to calculate BMI. Affect appropriate Healthy:  appears stated age HEENT: normal Neck supple  with no adenopathy JVP normal no bruits no thyromegaly Lungs clear with no wheezing and good diaphragmatic motion Heart:  S1/S2 no murmur, no rub, gallop or click PMI normal Abdomen: benighn, BS positve, no tenderness, no AAA no bruit.  No HSM or HJR Distal pulses intact with no bruits No edema Neuro non-focal Skin warm and dry No muscular weakness    EKG:   07/30/16  SR rate 64 normal ECG    Recent Labs: 06/22/2016: ALT 19; BUN 7; Creat 0.73; Hemoglobin 14.1; Platelets 322; Potassium 4.3; Sodium 140; TSH 1.22    Lipid Panel    Component Value Date/Time   CHOL 147 11/14/2009 2011   TRIG 95 11/14/2009 2011   HDL 70 11/14/2009 2011   CHOLHDL 2.1 Ratio 11/14/2009 2011   VLDL 19 11/14/2009 2011   LDLCALC 58 11/14/2009 2011      Wt Readings from Last 3 Encounters:  07/30/16 122 lb (55.3 kg)  06/22/16 122 lb (55.3 kg)  06/10/16 120 lb (54.4 kg)      Other studies Reviewed: Additional studies/ records that were reviewed today include: Notes primary ECG;s .    ASSESSMENT AND PLAN:  1.  Chest Pain 2. Migraines   Current medicines are reviewed at length with the patient today.  The patient does not have concerns regarding medicines.  The following changes have been made:  no change  Labs/ tests  ordered today include: ETT No orders of the defined types were placed in this encounter.    Disposition:   FU with [place] PRN      Signed, Charlton Haws, MD  08/21/2016 8:29 PM    [facility] [address] [place], [place], [place]  [address] Phone: [phone]; Fax: [phone]

## 2016-08-22 ENCOUNTER — Encounter: Payer: Self-pay | Admitting: Cardiovascular Disease

## 2016-08-22 ENCOUNTER — Ambulatory Visit: Payer: Medicaid Other | Admitting: Cardiovascular Disease

## 2016-08-23 ENCOUNTER — Encounter: Payer: Medicaid Other | Admitting: Family Medicine

## 2016-09-21 ENCOUNTER — Encounter: Payer: Self-pay | Admitting: Cardiovascular Disease

## 2016-09-21 ENCOUNTER — Ambulatory Visit (INDEPENDENT_AMBULATORY_CARE_PROVIDER_SITE_OTHER): Payer: Medicaid Other | Admitting: Cardiovascular Disease

## 2016-09-21 VITALS — BP 138/74 | HR 106 | Ht 61.0 in | Wt 121.0 lb

## 2016-09-21 DIAGNOSIS — R079 Chest pain, unspecified: Secondary | ICD-10-CM

## 2016-09-21 NOTE — Progress Notes (Signed)
CARDIOLOGY CONSULT NOTE  Patient ID: Jaclyn Howe MRN: [id number] DOB/AGE: April 13, 1986 30 y.o.  Admit date: (Not on file) Primary Physician: Syliva Overman, MD Referring Physician:   Reason for Consultation: chest pain  HPI: 30 year old female presents for evaluation of chest pain. ECG 8/7 showed normal sinus rhythm. ECG performed in the office today shows sinus rhythm with sinus arrhythmia. She smokes 2-3 cigarettes daily and has done so since she was a teenager.. He has no known family of premature coronary disease. He has migraine headaches. Chest pain is located retrosternally. Says it has been going on for about 4 weeks. It is associated with exertion. There is associated diaphoresis and mild shortness of breath. She has had dizziness and blurry vision but denies syncope. She denies orthopnea, palpitations, and leg swelling.   No Known Allergies  Current Outpatient Prescriptions  Medication Sig Dispense Refill  . Vitamin D, Ergocalciferol, (DRISDOL) 50000 units CAPS capsule Take 50,000 Units by mouth every 7 (seven) days.    . butalbital-acetaminophen-caffeine (FIORICET) 50-325-40 MG tablet One tablet twice daily, maximum twice weekly for migraine headache 16 tablet 3  . MedroxyPROGESTERone Acetate 150 MG/ML SUSY INJECT 1 ML INTRAMUSCULARLY ONCE EVERY 3 MONTHS. 1 mL 3   No current facility-administered medications for this visit.     Past Medical History:  Diagnosis Date  . Chronic abdominal pain   . Chronic headache   . Nausea and vomiting    Recurrent    Past Surgical History:  Procedure Laterality Date  . ELECTIVE ABORTION      Social History   Social History  . Marital status: Single    Spouse name: N/A  . Number of children: N/A  . Years of education: N/A   Occupational History  . Not on file.   Social History Main Topics  . Smoking status: Current Every Day Smoker    Packs/day: 0.25    Years: 3.00    Types: Cigarettes    Start date:  09/21/2001  . Smokeless tobacco: Never Used  . Alcohol use No  . Drug use: No  . Sexual activity: Yes    Birth control/ protection: Injection   Other Topics Concern  . Not on file   Social History Narrative  . No narrative on file     No family history of premature CAD in 1st degree relatives.  Prior to Admission medications   Medication Sig Start Date End Date Taking? Authorizing Provider  Vitamin D, Ergocalciferol, (DRISDOL) 50000 units CAPS capsule Take 50,000 Units by mouth every 7 (seven) days.   Yes Historical Provider, MD  butalbital-acetaminophen-caffeine (FIORICET) 50-325-40 MG tablet One tablet twice daily, maximum twice weekly for migraine headache 07/30/16   Kerri Perches, MD  MedroxyPROGESTERone Acetate 150 MG/ML SUSY INJECT 1 ML INTRAMUSCULARLY ONCE EVERY 3 MONTHS. 05/22/16   Adline Potter, NP     Review of systems complete and found to be negative unless listed above in HPI     Physical exam Blood pressure 138/74, pulse (!) 106, height 5\' 1"  (1.549 m), weight 121 lb (54.9 kg), SpO2 96 %. General: NAD Neck: No JVD, no thyromegaly or thyroid nodule.  Lungs: Clear to auscultation bilaterally with normal respiratory effort. CV: Nondisplaced PMI. Regular rate and rhythm, normal S1/S2, no S3/S4, no murmur.  No peripheral edema.  No carotid bruit.    Abdomen: Soft, nontender, no hepatosplenomegaly, no distention.  Skin: Intact without lesions or rashes.  Neurologic: Alert and oriented x 3.  Psych: Normal affect. Extremities: No clubbing or cyanosis.  HEENT: Normal.   ECG: Most recent ECG reviewed.  Labs:   Lab Results  Component Value Date   WBC 6.3 06/22/2016   HGB 14.1 06/22/2016   HCT 42.0 06/22/2016   MCV 96.6 06/22/2016   PLT 322 06/22/2016   No results for input(s): NA, K, CL, CO2, BUN, CREATININE, CALCIUM, PROT, BILITOT, ALKPHOS, ALT, AST, GLUCOSE in the last 168 hours.  Invalid input(s): LABALBU No results found for: CKTOTAL, CKMB,  CKMBINDEX, TROPONINI  Lab Results  Component Value Date   CHOL 147 11/14/2009   Lab Results  Component Value Date   HDL 70 11/14/2009   Lab Results  Component Value Date   LDLCALC 58 11/14/2009   Lab Results  Component Value Date   TRIG 95 11/14/2009   Lab Results  Component Value Date   CHOLHDL 2.1 Ratio 11/14/2009   No results found for: LDLDIRECT       Studies: No results found.  ASSESSMENT AND PLAN:  1. Chest pain: Primary risk factor for ischemic heart disease is tobacco use. I will obtain an exercise treadmill stress test and echocardiogram for further clarification.  Dispo: fu 6-8 weeks.   Signed: Prentice DockerSuresh Rolland Steinert, M.D., F.A.C.C.  09/21/2016, 9:01 AM

## 2016-09-21 NOTE — Patient Instructions (Signed)
Your physician recommends that you schedule a follow-up appointment in:  6-8 weks    Your physician has requested that you have an echocardiogram. Echocardiography is a painless test that uses sound waves to create images of your heart. It provides your doctor with information about the size and shape of your heart and how well your heart's chambers and valves are working. This procedure takes approximately one hour. There are no restrictions for this procedure.    Your physician has requested that you have an exercise tolerance test. For further information please visit [email or web address]. Please also follow instruction sheet, as given.     Thank you for choosing  [facility] !

## 2016-10-03 ENCOUNTER — Ambulatory Visit (HOSPITAL_COMMUNITY): Payer: Medicaid Other | Attending: Cardiovascular Disease

## 2016-10-03 ENCOUNTER — Ambulatory Visit (HOSPITAL_COMMUNITY): Admission: RE | Admit: 2016-10-03 | Payer: Medicaid Other | Source: Ambulatory Visit

## 2016-11-12 ENCOUNTER — Ambulatory Visit (INDEPENDENT_AMBULATORY_CARE_PROVIDER_SITE_OTHER): Payer: Medicaid Other | Admitting: *Deleted

## 2016-11-12 ENCOUNTER — Ambulatory Visit: Payer: Medicaid Other | Admitting: Cardiovascular Disease

## 2016-11-12 DIAGNOSIS — Z3202 Encounter for pregnancy test, result negative: Secondary | ICD-10-CM

## 2016-11-12 DIAGNOSIS — Z3042 Encounter for surveillance of injectable contraceptive: Secondary | ICD-10-CM

## 2016-11-12 LAB — POCT URINE PREGNANCY: Preg Test, Ur: NEGATIVE

## 2016-11-12 MED ORDER — MEDROXYPROGESTERONE ACETATE 150 MG/ML IM SUSP
150.0000 mg | Freq: Once | INTRAMUSCULAR | Status: AC
  Administered 2016-11-12: 150 mg via INTRAMUSCULAR

## 2016-11-12 NOTE — Progress Notes (Signed)
Patient ID: Jaclyn BeltonShameeka Howe, female   DOB: 11/09/1986, 30 y.o.   MRN: [id number]  Pt here for Depo-Provera injection.  Pt reports no problems or concerns with the Depo, UPT negative today.  Injection given in Lt Deltoid, pt tolerated well and informed to return in 3 months for next injection.

## 2016-11-21 ENCOUNTER — Ambulatory Visit (HOSPITAL_COMMUNITY): Admission: RE | Admit: 2016-11-21 | Payer: Medicaid Other | Source: Ambulatory Visit

## 2016-12-11 ENCOUNTER — Encounter: Payer: Self-pay | Admitting: Family Medicine

## 2016-12-11 ENCOUNTER — Ambulatory Visit: Payer: Medicaid Other | Admitting: Family Medicine

## 2017-01-09 ENCOUNTER — Ambulatory Visit: Payer: Medicaid Other | Admitting: Cardiovascular Disease

## 2017-01-23 ENCOUNTER — Encounter: Payer: Self-pay | Admitting: Physician Assistant

## 2017-01-23 ENCOUNTER — Ambulatory Visit: Payer: Medicaid Other | Admitting: Physician Assistant

## 2017-01-23 NOTE — Progress Notes (Deleted)
Cardiology Office Note    Date:  01/23/2017   ID:  Jaclyn Howe, DOB 11/20/1986, MRN [id number]  PCP:  Syliva OvermanMargaret Simpson, MD  Cardiologist:   No chief complaint on file.   History of Present Illness:  Jaclyn Howe is a 31 y.o. female ***    Past Medical History:  Diagnosis Date  . Chronic abdominal pain   . Chronic headache   . Nausea and vomiting    Recurrent    Past Surgical History:  Procedure Laterality Date  . ELECTIVE ABORTION      Current Medications: Outpatient Medications Prior to Visit  Medication Sig Dispense Refill  . butalbital-acetaminophen-caffeine (FIORICET) 50-325-40 MG tablet One tablet twice daily, maximum twice weekly for migraine headache 16 tablet 3  . MedroxyPROGESTERone Acetate 150 MG/ML SUSY INJECT 1 ML INTRAMUSCULARLY ONCE EVERY 3 MONTHS. 1 mL 3  . Vitamin D, Ergocalciferol, (DRISDOL) 50000 units CAPS capsule Take 50,000 Units by mouth every 7 (seven) days.     No facility-administered medications prior to visit.      Allergies:   Patient has no known allergies.   Social History   Social History  . Marital status: Single    Spouse name: N/A  . Number of children: N/A  . Years of education: N/A   Social History Main Topics  . Smoking status: Current Every Day Smoker    Packs/day: 0.25    Years: 3.00    Types: Cigarettes    Start date: 09/21/2001  . Smokeless tobacco: Never Used  . Alcohol use No  . Drug use: No  . Sexual activity: Yes    Birth control/ protection: Injection   Other Topics Concern  . Not on file   Social History Narrative  . No narrative on file     Family History:  The patient's ***family history is not on file.   ROS:   Please see the history of present illness.    ROS All other systems reviewed and are negative.   PHYSICAL EXAM:   VS:  There were no vitals taken for this visit.  Physical Exam  GEN: Well nourished, well developed, in no acute distress HEENT: normal Neck: no JVD, carotid  bruits, or masses Cardiac:RRR; no murmurs, rubs, or gallops  Respiratory:  clear to auscultation bilaterally, normal work of breathing GI: soft, nontender, nondistended, + BS Ext: without cyanosis, clubbing, or edema, Good distal pulses bilaterally MS: no deformity or atrophy Skin: warm and dry, no rash Neuro:  Alert and Oriented x 3, Strength and sensation are intact Psych: euthymic mood, full affect  Wt Readings from Last 3 Encounters:  09/21/16 121 lb (54.9 kg)  07/30/16 122 lb (55.3 kg)  06/22/16 122 lb (55.3 kg)      Studies/Labs Reviewed:   EKG:  EKG is*** ordered today.  The ekg ordered today demonstrates ***  Recent Labs: 06/22/2016: ALT 19; BUN 7; Creat 0.73; Hemoglobin 14.1; Platelets 322; Potassium 4.3; Sodium 140; TSH 1.22   Lipid Panel    Component Value Date/Time   CHOL 147 11/14/2009 2011   TRIG 95 11/14/2009 2011   HDL 70 11/14/2009 2011   CHOLHDL 2.1 Ratio 11/14/2009 2011   VLDL 19 11/14/2009 2011   LDLCALC 58 11/14/2009 2011    Additional studies/ records that were reviewed today include:  ***    ASSESSMENT:    No diagnosis found.   PLAN:  In order of problems listed above:      Medication Adjustments/Labs and Tests Ordered:  Current medicines are reviewed at length with the patient today.  Concerns regarding medicines are outlined above.  Medication changes, Labs and Tests ordered today are listed in the Patient Instructions below. There are no Patient Instructions on file for this visit.   Elson Clan, PA-C  01/23/2017 12:31 PM    [facility] [address] [place], [place], [place]  [address] Phone: [phone]; Fax: [phone]

## 2017-02-04 ENCOUNTER — Encounter: Payer: Self-pay | Admitting: Family Medicine

## 2017-02-04 ENCOUNTER — Ambulatory Visit: Payer: Medicaid Other | Admitting: Family Medicine

## 2017-02-13 ENCOUNTER — Encounter: Payer: Self-pay | Admitting: *Deleted

## 2017-02-13 ENCOUNTER — Ambulatory Visit (INDEPENDENT_AMBULATORY_CARE_PROVIDER_SITE_OTHER): Payer: Medicaid Other | Admitting: *Deleted

## 2017-02-13 DIAGNOSIS — Z308 Encounter for other contraceptive management: Secondary | ICD-10-CM

## 2017-02-13 DIAGNOSIS — Z3042 Encounter for surveillance of injectable contraceptive: Secondary | ICD-10-CM

## 2017-02-13 DIAGNOSIS — Z3202 Encounter for pregnancy test, result negative: Secondary | ICD-10-CM

## 2017-02-13 LAB — POCT URINE PREGNANCY: Preg Test, Ur: NEGATIVE

## 2017-02-13 MED ORDER — MEDROXYPROGESTERONE ACETATE 150 MG/ML IM SUSP
150.0000 mg | Freq: Once | INTRAMUSCULAR | Status: AC
  Administered 2017-02-13: 150 mg via INTRAMUSCULAR

## 2017-02-13 NOTE — Progress Notes (Signed)
Pt here for depo. Pt is 2 days late getting shot. Hasn't had sex in 1 month. Ok to give shot today. Pt tolerated shot well. Return in 12 weeks for next shot. JSY

## 2017-05-08 ENCOUNTER — Ambulatory Visit: Payer: Medicaid Other

## 2017-05-08 ENCOUNTER — Ambulatory Visit (INDEPENDENT_AMBULATORY_CARE_PROVIDER_SITE_OTHER): Payer: Medicaid Other | Admitting: *Deleted

## 2017-05-08 DIAGNOSIS — Z3042 Encounter for surveillance of injectable contraceptive: Secondary | ICD-10-CM | POA: Diagnosis not present

## 2017-05-08 DIAGNOSIS — Z3202 Encounter for pregnancy test, result negative: Secondary | ICD-10-CM

## 2017-05-08 DIAGNOSIS — Z308 Encounter for other contraceptive management: Secondary | ICD-10-CM

## 2017-05-08 LAB — POCT URINE PREGNANCY: PREG TEST UR: NEGATIVE

## 2017-05-08 MED ORDER — MEDROXYPROGESTERONE ACETATE 150 MG/ML IM SUSP
150.0000 mg | Freq: Once | INTRAMUSCULAR | Status: AC
  Administered 2017-05-08: 150 mg via INTRAMUSCULAR

## 2017-05-08 NOTE — Progress Notes (Signed)
Pt here for Depo. Pt tolerated shot well. Return in 12 weeks for next shot. JSY 

## 2017-07-24 ENCOUNTER — Telehealth: Payer: Self-pay | Admitting: *Deleted

## 2017-07-24 NOTE — Telephone Encounter (Signed)
I called patient to schedule appt first time someone answered and hung up, second time I had to leave a message on voicemail.

## 2017-07-24 NOTE — Telephone Encounter (Signed)
Patient called stating she has been having stomach pain for about 3 or 4 weeks. Patient states she needs to come in and see Dr Lodema HongSimpson. When can I schedule the patient? [phone]

## 2017-07-24 NOTE — Telephone Encounter (Signed)
In a same day slot on Monday

## 2017-07-30 ENCOUNTER — Other Ambulatory Visit: Payer: Self-pay | Admitting: Adult Health

## 2017-07-31 ENCOUNTER — Encounter: Payer: Self-pay | Admitting: Family Medicine

## 2017-07-31 ENCOUNTER — Ambulatory Visit: Payer: Medicaid Other | Admitting: Family Medicine

## 2017-07-31 ENCOUNTER — Ambulatory Visit: Payer: Medicaid Other

## 2017-08-01 ENCOUNTER — Ambulatory Visit (INDEPENDENT_AMBULATORY_CARE_PROVIDER_SITE_OTHER): Payer: Medicaid Other

## 2017-08-01 VITALS — Wt 123.0 lb

## 2017-08-01 DIAGNOSIS — Z3202 Encounter for pregnancy test, result negative: Secondary | ICD-10-CM

## 2017-08-01 DIAGNOSIS — Z3042 Encounter for surveillance of injectable contraceptive: Secondary | ICD-10-CM

## 2017-08-01 LAB — POCT URINE PREGNANCY: Preg Test, Ur: NEGATIVE

## 2017-08-01 MED ORDER — MEDROXYPROGESTERONE ACETATE 150 MG/ML IM SUSP
150.0000 mg | Freq: Once | INTRAMUSCULAR | Status: AC
  Administered 2017-08-01: 150 mg via INTRAMUSCULAR

## 2017-08-01 NOTE — Progress Notes (Signed)
PT here for depo shot 150 mg IM given RT Deltoid. Tolerated well. Return 12 weeks for next shot. Pad CMA 

## 2017-08-05 ENCOUNTER — Ambulatory Visit: Payer: Medicaid Other | Admitting: Family Medicine

## 2017-08-05 ENCOUNTER — Encounter: Payer: Self-pay | Admitting: Family Medicine

## 2017-08-06 ENCOUNTER — Ambulatory Visit: Payer: Medicaid Other | Admitting: Family Medicine

## 2017-08-10 LAB — BASIC METABOLIC PANEL: Glucose: 89

## 2017-08-20 ENCOUNTER — Other Ambulatory Visit: Payer: Medicaid Other | Admitting: Adult Health

## 2017-09-03 ENCOUNTER — Other Ambulatory Visit: Payer: Medicaid Other | Admitting: Adult Health

## 2017-10-24 ENCOUNTER — Ambulatory Visit: Payer: Medicaid Other

## 2017-10-28 ENCOUNTER — Other Ambulatory Visit: Payer: Self-pay | Admitting: Adult Health

## 2017-10-29 ENCOUNTER — Encounter: Payer: Self-pay | Admitting: *Deleted

## 2017-10-29 ENCOUNTER — Ambulatory Visit (INDEPENDENT_AMBULATORY_CARE_PROVIDER_SITE_OTHER): Payer: Medicaid Other | Admitting: *Deleted

## 2017-10-29 DIAGNOSIS — Z3042 Encounter for surveillance of injectable contraceptive: Secondary | ICD-10-CM | POA: Diagnosis not present

## 2017-10-29 DIAGNOSIS — Z3202 Encounter for pregnancy test, result negative: Secondary | ICD-10-CM

## 2017-10-29 LAB — POCT URINE PREGNANCY: Preg Test, Ur: NEGATIVE

## 2017-10-29 MED ORDER — MEDROXYPROGESTERONE ACETATE 150 MG/ML IM SUSP
150.0000 mg | Freq: Once | INTRAMUSCULAR | Status: AC
  Administered 2017-10-29: 150 mg via INTRAMUSCULAR

## 2017-10-29 NOTE — Progress Notes (Signed)
Pt given DepoProvera 150mg IM left deltoid without complications. Advised pt to return in 12 weeks for next injection.  

## 2018-01-18 ENCOUNTER — Other Ambulatory Visit: Payer: Self-pay | Admitting: Adult Health

## 2018-01-21 ENCOUNTER — Ambulatory Visit: Payer: Medicaid Other

## 2018-01-22 ENCOUNTER — Ambulatory Visit (INDEPENDENT_AMBULATORY_CARE_PROVIDER_SITE_OTHER): Payer: Medicaid Other | Admitting: *Deleted

## 2018-01-22 ENCOUNTER — Other Ambulatory Visit: Payer: Self-pay

## 2018-01-22 DIAGNOSIS — Z3202 Encounter for pregnancy test, result negative: Secondary | ICD-10-CM | POA: Diagnosis not present

## 2018-01-22 DIAGNOSIS — Z3042 Encounter for surveillance of injectable contraceptive: Secondary | ICD-10-CM

## 2018-01-22 LAB — POCT URINE PREGNANCY: Preg Test, Ur: NEGATIVE

## 2018-01-22 MED ORDER — MEDROXYPROGESTERONE ACETATE 150 MG/ML IM SUSP
150.0000 mg | Freq: Once | INTRAMUSCULAR | Status: AC
  Administered 2018-01-22: 150 mg via INTRAMUSCULAR

## 2018-01-22 NOTE — Progress Notes (Signed)
Pt given DepoProvera 150mg IM right deltoid without complications. Advised to return in 12 weeks for next injection. 

## 2018-02-07 ENCOUNTER — Encounter (HOSPITAL_COMMUNITY): Payer: Self-pay | Admitting: Emergency Medicine

## 2018-02-07 ENCOUNTER — Emergency Department (HOSPITAL_COMMUNITY)
Admission: EM | Admit: 2018-02-07 | Discharge: 2018-02-07 | Disposition: A | Discharge status: Home / Self Care | Payer: Medicaid Other | Attending: Emergency Medicine | Admitting: Emergency Medicine

## 2018-02-07 DIAGNOSIS — F17210 Nicotine dependence, cigarettes, uncomplicated: Secondary | ICD-10-CM | POA: Insufficient documentation

## 2018-02-07 DIAGNOSIS — J0110 Acute frontal sinusitis, unspecified: Secondary | ICD-10-CM | POA: Diagnosis not present

## 2018-02-07 DIAGNOSIS — R05 Cough: Secondary | ICD-10-CM | POA: Diagnosis present

## 2018-02-07 MED ORDER — LORATADINE 10 MG PO TABS: 10.0000 mg | ORAL_TABLET | Freq: Every day | ORAL | 0 refills | Status: DC

## 2018-02-07 MED ORDER — BENZONATATE 100 MG PO CAPS: 100.0000 mg | ORAL_CAPSULE | Freq: Three times a day (TID) | ORAL | 0 refills | Status: DC | PRN

## 2018-02-07 MED ORDER — FLUTICASONE PROPIONATE 50 MCG/ACT NA SUSP: 2.0000 | Freq: Every day | NASAL | 2 refills | Status: DC

## 2018-02-07 NOTE — ED Triage Notes (Signed)
Pt reports productive cough with temp of 100.4 x 2 days.  No meds taken pta.

## 2018-02-07 NOTE — ED Provider Notes (Signed)
[facility] EMERGENCY DEPARTMENT Provider Note   CSN: [id number] Arrival date & time: 02/07/18  8295     History   Chief Complaint Chief Complaint  Patient presents with  . Cough    HPI Jaclyn Howe is a 32 y.o. female.  HPI Patient presents with several days of facial pain, nasal congestion and cough productive of thick sputum.  Denies chest pain or shortness of breath.  Is also been running low-grade fever of 100.4.  Denies any neck pain or stiffness.  No new rashes.  No abdominal pain, nausea or vomiting.   Past Medical History:  Diagnosis Date  . Chronic abdominal pain   . Chronic headache   . Nausea and vomiting    Recurrent    Patient Active Problem List   Diagnosis Date Noted  . Left maxillary sinusitis 07/30/2016  . Chest pain 07/30/2016  . Allergic rhinitis 06/22/2016  . NICOTINE ADDICTION 07/13/2008  . Migraine headache 07/13/2008    Past Surgical History:  Procedure Laterality Date  . ELECTIVE ABORTION      OB History    Gravida Para Term Preterm AB Living   1 1 1     1    SAB TAB Ectopic Multiple Live Births                   Home Medications    Prior to Admission medications   Medication Sig Start Date End Date Taking? Authorizing Provider  benzonatate (TESSALON) 100 MG capsule Take 1 capsule (100 mg total) by mouth 3 (three) times daily as needed for cough. 02/07/18   Loren Racer, MD  fluticasone (FLONASE) 50 MCG/ACT nasal spray Place 2 sprays into both nostrils daily. 02/07/18   Loren Racer, MD  loratadine (CLARITIN) 10 MG tablet Take 1 tablet (10 mg total) by mouth daily. 02/07/18   Loren Racer, MD  MedroxyPROGESTERone Acetate 150 MG/ML SUSY INJECT 1 ML INTRAMUSCULARLY ONCE EVERY 3 MONTHS. 01/20/18   Adline Potter, NP  Vitamin D, Ergocalciferol, (DRISDOL) 50000 units CAPS capsule Take 50,000 Units by mouth every 7 (seven) days.    [provider]    Family History History reviewed. No pertinent family  history.  Social History Social History   Tobacco Use  . Smoking status: Current Every Day Smoker    Packs/day: 0.25    Years: 3.00    Pack years: 0.75    Types: Cigarettes    Start date: 09/21/2001  . Smokeless tobacco: Never Used  Substance Use Topics  . Alcohol use: No  . Drug use: No     Allergies   Patient has no known allergies.   Review of Systems Review of Systems  Constitutional: Positive for chills, fatigue and fever.  HENT: Positive for congestion, sinus pressure and sinus pain. Negative for sore throat, trouble swallowing and voice change.   Eyes: Negative for visual disturbance.  Respiratory: Positive for cough. Negative for shortness of breath and wheezing.   Cardiovascular: Negative for chest pain, palpitations and leg swelling.  Gastrointestinal: Negative for abdominal pain, diarrhea, nausea and vomiting.  Genitourinary: Negative for dysuria, flank pain, frequency and pelvic pain.  Musculoskeletal: Negative for back pain, myalgias, neck pain and neck stiffness.  Skin: Negative for rash and wound.  Neurological: Positive for headaches. Negative for dizziness, weakness, light-headedness and numbness.  All other systems reviewed and are negative.    Physical Exam Updated Vital Signs BP (!) 127/93 (BP Location: Right Arm)   Pulse 98   Temp  98.8 F (37.1 C) (Oral)   Resp 18   Ht 5\' 1"  (1.549 m)   Wt 54.4 kg (120 lb)   SpO2 100%   BMI 22.67 kg/m   Physical Exam  Constitutional: She is oriented to person, place, and time. She appears well-developed and well-nourished. No distress.  HENT:  Head: Normocephalic and atraumatic.  Mouth/Throat: Oropharynx is clear and moist. No oropharyngeal exudate.  Bilateral nasal mucosal edema.  Patient does have bilateral frontal sinus tenderness to percussion.  Oropharynx is clear.  Eyes: EOM are normal. Pupils are equal, round, and reactive to light.  Neck: Normal range of motion. Neck supple.  No meningismus.   Cardiovascular: Normal rate and regular rhythm. Exam reveals no gallop and no friction rub.  No murmur heard. Pulmonary/Chest: Effort normal and breath sounds normal. No stridor. No respiratory distress. She has no wheezes. She has no rales. She exhibits no tenderness.  Abdominal: Soft. Bowel sounds are normal. There is no tenderness. There is no rebound and no guarding.  Musculoskeletal: Normal range of motion. She exhibits no edema or tenderness.  No midline thoracic or lumbar tenderness.  No lower extremity swelling or asymmetry.  No CVA tenderness bilaterally.  Lymphadenopathy:    She has no cervical adenopathy.  Neurological: She is alert and oriented to person, place, and time.  Moves all extremities without focal deficit.  Sensation fully intact.  Ambulating without difficulty.  Skin: Skin is warm and dry. Capillary refill takes less than 2 seconds. No rash noted. She is not diaphoretic. No erythema.  Psychiatric: She has a normal mood and affect. Her behavior is normal.  Nursing note and vitals reviewed.    ED Treatments / Results  Labs (all labs ordered are listed, but only abnormal results are displayed) Labs Reviewed - No data to display  EKG  EKG Interpretation None       Radiology No results found.  Procedures Procedures (including critical care time)  Medications Ordered in ED Medications - No data to display   Initial Impression / Assessment and Plan / ED Course  I have reviewed the triage vital signs and the nursing notes.  Pertinent labs & imaging results that were available during my care of the patient were reviewed by me and considered in my medical decision making (see chart for details).     Patient very well-appearing.  Lungs are clear.  Low suspicion for pneumonia.  Will treat for frontal sinusitis.  Return precautions given.  Final Clinical Impressions(s) / ED Diagnoses   Final diagnoses:  Acute frontal sinusitis, recurrence not specified     ED Discharge Orders        Ordered    fluticasone (FLONASE) 50 MCG/ACT nasal spray  Daily     02/07/18 0904    loratadine (CLARITIN) 10 MG tablet  Daily     02/07/18 0904    benzonatate (TESSALON) 100 MG capsule  3 times daily PRN     02/07/18 0904       Loren RacerYelverton, Alexandria Shiflett, MD 02/07/18 1009

## 2018-04-16 ENCOUNTER — Other Ambulatory Visit: Payer: Self-pay

## 2018-04-16 ENCOUNTER — Ambulatory Visit: Payer: Medicaid Other

## 2018-04-16 ENCOUNTER — Ambulatory Visit (INDEPENDENT_AMBULATORY_CARE_PROVIDER_SITE_OTHER): Payer: Medicaid Other | Admitting: *Deleted

## 2018-04-16 ENCOUNTER — Encounter: Payer: Self-pay | Admitting: *Deleted

## 2018-04-16 DIAGNOSIS — Z3202 Encounter for pregnancy test, result negative: Secondary | ICD-10-CM | POA: Diagnosis not present

## 2018-04-16 DIAGNOSIS — Z3042 Encounter for surveillance of injectable contraceptive: Secondary | ICD-10-CM

## 2018-04-16 LAB — POCT URINE PREGNANCY: Preg Test, Ur: NEGATIVE

## 2018-04-16 MED ORDER — MEDROXYPROGESTERONE ACETATE 150 MG/ML IM SUSP
150.0000 mg | Freq: Once | INTRAMUSCULAR | Status: AC
  Administered 2018-04-16: 150 mg via INTRAMUSCULAR

## 2018-04-16 NOTE — Progress Notes (Signed)
Pt given DepoProvera 150mg IM left deltoid without complications. Advised to return in 12 weeks for next injection. 

## 2018-04-18 ENCOUNTER — Telehealth: Payer: Self-pay | Admitting: Family Medicine

## 2018-04-18 DIAGNOSIS — E876 Hypokalemia: Secondary | ICD-10-CM

## 2018-04-18 NOTE — Telephone Encounter (Signed)
Patient called in to schedule an appt with Dr.Simpson to because her potassium is low. She has no showed her last 5 office visits and states she can never get an appt with Dr.Simpson. I told her she has had several appointments and that there is never a problem for us to schedule an appt but she has to come to her scheduled appt or call and cancel 24hr in advance per office policy.   Is this okay to schedule ? Im asking because of all the no show visits and she has not been seen since august of 2017.

## 2018-04-18 NOTE — Telephone Encounter (Signed)
Called patient and left message for them to return call at the office   

## 2018-04-18 NOTE — Telephone Encounter (Signed)
Please speak with pt , if she reports tiredness , please order cbc , chem 7 and Tsh, she can get labs done  Ordered as fatigue and needs to understand that she needs to keep an appointment that will be scheduled Send me any concerns, eg if she has acute symptoms like vomiting or loose stools that would cause her to be acutely ill with low potassium she needs to be directed to the ED. She does have a past h/o alcohol use

## 2018-04-21 NOTE — Telephone Encounter (Signed)
Was told when donating plasma that her potassium was low but states it was 5.9. Advised to get labs today and said that we would call her back with a ov date

## 2018-07-07 ENCOUNTER — Other Ambulatory Visit: Payer: Self-pay

## 2018-07-07 ENCOUNTER — Ambulatory Visit (INDEPENDENT_AMBULATORY_CARE_PROVIDER_SITE_OTHER): Payer: Medicaid Other | Admitting: Family Medicine

## 2018-07-07 ENCOUNTER — Encounter: Payer: Self-pay | Admitting: Family Medicine

## 2018-07-07 VITALS — BP 116/70 | HR 82 | Resp 14 | Ht 61.0 in | Wt 126.0 lb

## 2018-07-07 DIAGNOSIS — R1013 Epigastric pain: Secondary | ICD-10-CM | POA: Insufficient documentation

## 2018-07-07 DIAGNOSIS — R5383 Other fatigue: Secondary | ICD-10-CM | POA: Diagnosis not present

## 2018-07-07 DIAGNOSIS — F17200 Nicotine dependence, unspecified, uncomplicated: Secondary | ICD-10-CM | POA: Diagnosis not present

## 2018-07-07 DIAGNOSIS — G43101 Migraine with aura, not intractable, with status migrainosus: Secondary | ICD-10-CM | POA: Diagnosis not present

## 2018-07-07 HISTORY — DX: Epigastric pain: R10.13

## 2018-07-07 MED ORDER — PANTOPRAZOLE SODIUM 20 MG PO TBEC: 20.0000 mg | DELAYED_RELEASE_TABLET | Freq: Every day | ORAL | 2 refills | Status: DC

## 2018-07-07 MED ORDER — TOPIRAMATE 25 MG PO CPSP: 25.0000 mg | ORAL_CAPSULE | Freq: Two times a day (BID) | ORAL | 3 refills | Status: DC

## 2018-07-07 MED ORDER — IBUPROFEN 600 MG PO TABS: ORAL_TABLET | ORAL | 3 refills | Status: DC

## 2018-07-07 NOTE — Assessment & Plan Note (Addendum)
Occurs Twice weekly, uncontrolled ,needs topamax and ibuprofen , using BC powder

## 2018-07-07 NOTE — Patient Instructions (Signed)
Well woman and pap end August with flu vaccine   Labs today cBC, lipid, cmp and EGFr,vit D and TSH and H pylori breath test  New for headaches is two times daily topamax to reduce frequency and [place] e ibuprofen as directed for the headache instead of BC powder  New for heartburn is protonix one daily  Need to stop cigarettes today!

## 2018-07-07 NOTE — Progress Notes (Signed)
   Jaclyn BeltonShameeka Fogg     MRN: [id number]      DOB: 04/13/1986   HPI Jaclyn Howe is here for re establishment of care. She is concerned about falling asleep easily and occasional abdominal pain. States her potassium has been low on occasion and she has intermittent epigastric pain, takes no medication but does drink alcohol 3 times weekly, smokes cigarettes daily Uses BC powder for headaches twice weekly ROS Denies recent fever or chills. Denies sinus pressure, nasal congestion, ear pain or sore throat. Denies chest congestion, productive cough or wheezing. Denies chest pains, palpitations and leg swelling Denies  nausea, vomiting,diarrhea or constipation.   Denies dysuria, frequency, hesitancy or incontinence. Denies joint pain, swelling and limitation in mobility. Denies  seizures, numbness, or tingling. Denies depression, anxiety or insomnia. Denies skin break down or rash.   PE  BP 116/70 (BP Location: Left Arm, Patient Position: Sitting, Cuff Size: Normal)   Pulse 82   Resp 14   Ht 5\' 1"  (1.549 m)   Wt 126 lb (57.2 kg)   SpO2 96%   BMI 23.81 kg/m   Patient alert and oriented and in no cardiopulmonary distress.  HEENT: No facial asymmetry, EOMI,   oropharynx pink and moist.  Neck supple no JVD, no mass.  Chest: Clear to auscultation bilaterally.  CVS: S1, S2 no murmurs, no S3.Regular rate.  ABD: Soft mild epigastric tenderness  Ext: No edema  MS: Adequate ROM spine, shoulders, hips and knees.  Skin: Intact, no ulcerations or rash noted.  Psych: Good eye contact, normal affect. Memory intact not anxious or depressed appearing.  CNS: CN 2-12 intact, power,  normal throughout.no focal deficits noted.   Assessment & Plan  Migraine headache Occurs Twice weekly, uncontrolled ,needs topamax and ibuprofen , using BC powder  Epigastric pain Check H pylori status and start PPI. Advised to d/c nicotine and caffeine  NICOTINE ADDICTION Asked: confirms currently smokes  cigarettes daily Assess: not willing to set quit date Advise: need to quit to reduce risk of cancer, heart disease and stroke Assist: counseled for 3 mins and info provided re community resources available Arrange: f/u in 3 months

## 2018-07-08 LAB — COMPLETE METABOLIC PANEL WITH GFR
AG Ratio: 1.6 (calc) (ref 1.0–2.5)
ALBUMIN MSPROF: 4.3 g/dL (ref 3.6–5.1)
ALT: 19 U/L (ref 6–29)
AST: 17 U/L (ref 10–30)
Alkaline phosphatase (APISO): 82 U/L (ref 33–115)
BUN: 9 mg/dL (ref 7–25)
CO2: 24 mmol/L (ref 20–32)
CREATININE: 0.71 mg/dL (ref 0.50–1.10)
Calcium: 9.5 mg/dL (ref 8.6–10.2)
Chloride: 107 mmol/L (ref 98–110)
GFR, EST AFRICAN AMERICAN: 130 mL/min/{1.73_m2} (ref >=60)
GFR, Est Non African American: 112 mL/min/{1.73_m2} (ref >=60)
GLUCOSE: 97 mg/dL (ref 65–99)
Globulin: 2.7 g/dL (calc) (ref 1.9–3.7)
Potassium: 4.8 mmol/L (ref 3.5–5.3)
Sodium: 137 mmol/L (ref 135–146)
TOTAL PROTEIN: 7 g/dL (ref 6.1–8.1)
Total Bilirubin: 0.4 mg/dL (ref 0.2–1.2)

## 2018-07-08 LAB — LIPID PANEL
Cholesterol: 222 mg/dL — ABNORMAL HIGH (ref <200)
HDL: 52 mg/dL (ref >50)
LDL Cholesterol (Calc): 133 mg/dL (calc) — ABNORMAL HIGH
NON-HDL CHOLESTEROL (CALC): 170 mg/dL — AB (ref <130)
Total CHOL/HDL Ratio: 4.3 (calc) (ref <5.0)
Triglycerides: 230 mg/dL — ABNORMAL HIGH (ref <150)

## 2018-07-08 LAB — TSH: TSH: 1 m[IU]/L

## 2018-07-08 LAB — CBC
HCT: 40.5 % (ref 35.0–45.0)
HEMOGLOBIN: 13.9 g/dL (ref 11.7–15.5)
MCH: 32.6 pg (ref 27.0–33.0)
MCHC: 34.3 g/dL (ref 32.0–36.0)
MCV: 94.8 fL (ref 80.0–100.0)
MPV: 10.2 fL (ref 7.5–12.5)
Platelets: 304 10*3/uL (ref 140–400)
RBC: 4.27 10*6/uL (ref 3.80–5.10)
RDW: 12.9 % (ref 11.0–15.0)
WBC: 6.4 10*3/uL (ref 3.8–10.8)

## 2018-07-08 LAB — VITAMIN D 25 HYDROXY (VIT D DEFICIENCY, FRACTURES): Vit D, 25-Hydroxy: 13 ng/mL — ABNORMAL LOW (ref 30–100)

## 2018-07-08 LAB — H. PYLORI BREATH TEST: H. pylori Breath Test: NOT DETECTED

## 2018-07-09 ENCOUNTER — Ambulatory Visit: Payer: Medicaid Other

## 2018-07-10 ENCOUNTER — Ambulatory Visit (INDEPENDENT_AMBULATORY_CARE_PROVIDER_SITE_OTHER): Payer: Medicaid Other | Admitting: *Deleted

## 2018-07-10 ENCOUNTER — Other Ambulatory Visit: Payer: Self-pay

## 2018-07-10 ENCOUNTER — Encounter: Payer: Self-pay | Admitting: *Deleted

## 2018-07-10 DIAGNOSIS — Z3202 Encounter for pregnancy test, result negative: Secondary | ICD-10-CM | POA: Diagnosis not present

## 2018-07-10 DIAGNOSIS — Z3042 Encounter for surveillance of injectable contraceptive: Secondary | ICD-10-CM | POA: Diagnosis not present

## 2018-07-10 LAB — POCT URINE PREGNANCY: Preg Test, Ur: NEGATIVE

## 2018-07-10 MED ORDER — MEDROXYPROGESTERONE ACETATE 150 MG/ML IM SUSP
150.0000 mg | Freq: Once | INTRAMUSCULAR | Status: AC
  Administered 2018-07-10: 150 mg via INTRAMUSCULAR

## 2018-07-10 NOTE — Progress Notes (Signed)
Pt given DepoProvera 150mg IM right deltoid without complications. Advised to return in 12 weeks for next injection. 

## 2018-07-11 ENCOUNTER — Other Ambulatory Visit: Payer: Self-pay | Admitting: Family Medicine

## 2018-07-11 MED ORDER — ERGOCALCIFEROL 1.25 MG (50000 UT) PO CAPS: 50000.0000 [IU] | ORAL_CAPSULE | ORAL | 3 refills | Status: DC

## 2018-07-13 ENCOUNTER — Encounter: Payer: Self-pay | Admitting: Family Medicine

## 2018-07-13 NOTE — Assessment & Plan Note (Signed)
Asked: confirms currently smokes cigarettes daily Assess: not willing to set quit date Advise: need to quit to reduce risk of cancer, heart disease and stroke Assist: counseled for 3 mins and info provided re community resources available Arrange: f/u in 3 months

## 2018-07-13 NOTE — Assessment & Plan Note (Signed)
Check H pylori status and start PPI. Advised to d/c nicotine and caffeine

## 2018-08-21 ENCOUNTER — Encounter: Payer: Medicaid Other | Admitting: Family Medicine

## 2018-08-27 ENCOUNTER — Encounter: Payer: Medicaid Other | Admitting: Family Medicine

## 2018-08-28 ENCOUNTER — Encounter: Payer: Self-pay | Admitting: Family Medicine

## 2018-10-02 ENCOUNTER — Ambulatory Visit: Payer: Medicaid Other

## 2018-10-07 ENCOUNTER — Ambulatory Visit (INDEPENDENT_AMBULATORY_CARE_PROVIDER_SITE_OTHER): Payer: Medicaid Other | Admitting: *Deleted

## 2018-10-07 ENCOUNTER — Encounter: Payer: Self-pay | Admitting: *Deleted

## 2018-10-07 DIAGNOSIS — Z3202 Encounter for pregnancy test, result negative: Secondary | ICD-10-CM | POA: Diagnosis not present

## 2018-10-07 DIAGNOSIS — Z308 Encounter for other contraceptive management: Secondary | ICD-10-CM

## 2018-10-07 DIAGNOSIS — Z3042 Encounter for surveillance of injectable contraceptive: Secondary | ICD-10-CM | POA: Diagnosis not present

## 2018-10-07 LAB — POCT URINE PREGNANCY: PREG TEST UR: NEGATIVE

## 2018-10-07 MED ORDER — MEDROXYPROGESTERONE ACETATE 150 MG/ML IM SUSP
150.0000 mg | Freq: Once | INTRAMUSCULAR | Status: AC
  Administered 2018-10-07: 150 mg via INTRAMUSCULAR

## 2018-10-07 NOTE — Progress Notes (Signed)
Pt here for Depo. Pt tolerated shot well. Return in 12 weeks for next shot. JSY 

## 2018-12-01 ENCOUNTER — Other Ambulatory Visit: Payer: Medicaid Other | Admitting: Adult Health

## 2018-12-30 ENCOUNTER — Ambulatory Visit (INDEPENDENT_AMBULATORY_CARE_PROVIDER_SITE_OTHER): Payer: Medicaid Other

## 2018-12-30 DIAGNOSIS — Z3042 Encounter for surveillance of injectable contraceptive: Secondary | ICD-10-CM | POA: Diagnosis not present

## 2018-12-30 DIAGNOSIS — Z3202 Encounter for pregnancy test, result negative: Secondary | ICD-10-CM

## 2018-12-30 LAB — POCT URINE PREGNANCY: PREG TEST UR: NEGATIVE

## 2018-12-30 MED ORDER — MEDROXYPROGESTERONE ACETATE 150 MG/ML IM SUSP
150.0000 mg | Freq: Once | INTRAMUSCULAR | Status: AC
  Administered 2018-12-30: 150 mg via INTRAMUSCULAR

## 2018-12-30 NOTE — Progress Notes (Signed)
Pt here for depo injection 150 mg IM given lt deltoid. Tolerated well.Return 12 weeks for next injection. Pad CMA 

## 2019-01-07 ENCOUNTER — Other Ambulatory Visit: Payer: Medicaid Other | Admitting: Adult Health

## 2019-02-02 ENCOUNTER — Emergency Department (HOSPITAL_COMMUNITY)
Admission: EM | Admit: 2019-02-02 | Discharge: 2019-02-02 | Disposition: A | Discharge status: Home / Self Care | Payer: Medicaid Other | Attending: Emergency Medicine | Admitting: Emergency Medicine

## 2019-02-02 ENCOUNTER — Other Ambulatory Visit: Payer: Self-pay

## 2019-02-02 ENCOUNTER — Encounter (HOSPITAL_COMMUNITY): Payer: Self-pay

## 2019-02-02 DIAGNOSIS — J111 Influenza due to unidentified influenza virus with other respiratory manifestations: Secondary | ICD-10-CM | POA: Insufficient documentation

## 2019-02-02 DIAGNOSIS — F17210 Nicotine dependence, cigarettes, uncomplicated: Secondary | ICD-10-CM | POA: Diagnosis not present

## 2019-02-02 DIAGNOSIS — Z79899 Other long term (current) drug therapy: Secondary | ICD-10-CM | POA: Insufficient documentation

## 2019-02-02 DIAGNOSIS — R05 Cough: Secondary | ICD-10-CM | POA: Diagnosis present

## 2019-02-02 DIAGNOSIS — R69 Illness, unspecified: Secondary | ICD-10-CM

## 2019-02-02 MED ORDER — IBUPROFEN 600 MG PO TABS: 600.0000 mg | ORAL_TABLET | Freq: Four times a day (QID) | ORAL | 0 refills | Status: DC

## 2019-02-02 MED ORDER — HYDROCOD POLST-CPM POLST ER 10-8 MG/5ML PO SUER
5.0000 mL | Freq: Once | ORAL | Status: AC
  Administered 2019-02-02: 5 mL via ORAL
  Filled 2019-02-02 (×2): qty 5

## 2019-02-02 MED ORDER — LORATADINE-PSEUDOEPHEDRINE ER 5-120 MG PO TB12: 1.0000 | ORAL_TABLET | Freq: Two times a day (BID) | ORAL | 0 refills | Status: DC

## 2019-02-02 MED ORDER — IBUPROFEN 400 MG PO TABS
400.0000 mg | ORAL_TABLET | Freq: Once | ORAL | Status: AC
  Administered 2019-02-02: 400 mg via ORAL
  Filled 2019-02-02: qty 1

## 2019-02-02 MED ORDER — PROMETHAZINE-DM 6.25-15 MG/5ML PO SYRP: 5.0000 mL | ORAL_SOLUTION | Freq: Four times a day (QID) | ORAL | 0 refills | Status: DC | PRN

## 2019-02-02 MED ORDER — DEXAMETHASONE 4 MG PO TABS: 4.0000 mg | ORAL_TABLET | Freq: Two times a day (BID) | ORAL | 0 refills | Status: DC

## 2019-02-02 MED ORDER — PREDNISONE 20 MG PO TABS
40.0000 mg | ORAL_TABLET | Freq: Once | ORAL | Status: AC
  Administered 2019-02-02: 40 mg via ORAL
  Filled 2019-02-02: qty 2

## 2019-02-02 NOTE — ED Triage Notes (Signed)
Pt reports cough, headache, and cp with coughing x 1 week.  Unknown if has had fever.

## 2019-02-02 NOTE — Discharge Instructions (Addendum)
Your oxygen level is 98% on room air.  This is within normal limits.  Your examination favors an flulike illness.  Please wash hands frequently.  Please increase fluids.  Use ibuprofen with each meal and at bedtime.  Use Claritin-D for congestion and Promethazine DM for cough.  Promethazine DM may cause drowsiness, please do not drive a vehicle, operate machinery, handle legal documents, or participate in activities requiring concentration when taking this medication.  Please see Dr. [name] or return to the emergency department if any changes in your condition, problems, or concerns.

## 2019-02-02 NOTE — ED Provider Notes (Signed)
[facility] EMERGENCY DEPARTMENT Provider Note   CSN: [id number] Arrival date & time: 02/02/19  0086     History   Chief Complaint Chief Complaint  Patient presents with  . Cough    HPI Jaclyn Howe is a 33 y.o. female.  The history is provided by the patient.  Cough  Cough characteristics:  Non-productive Severity:  Moderate Onset quality:  Gradual Duration:  1 week Timing:  Intermittent Progression:  Worsening Chronicity:  New Smoker: yes   Context: weather changes   Context: not sick contacts   Relieved by:  Nothing Worsened by:  Nothing Ineffective treatments:  None tried Associated symptoms: chills, diaphoresis, myalgias, sinus congestion and wheezing   Associated symptoms: no chest pain, no eye discharge and no shortness of breath   Associated symptoms comment:  Headache and chest discomfort Risk factors: no recent travel     Past Medical History:  Diagnosis Date  . Chronic abdominal pain   . Chronic headache   . Nausea and vomiting    Recurrent    Patient Active Problem List   Diagnosis Date Noted  . Epigastric pain 07/07/2018  . Allergic rhinitis 06/22/2016  . NICOTINE ADDICTION 07/13/2008  . Migraine headache 07/13/2008    Past Surgical History:  Procedure Laterality Date  . ELECTIVE ABORTION       OB History    Gravida  1   Para  1   Term  1   Preterm      AB      Living  1     SAB      TAB      Ectopic      Multiple      Live Births               Home Medications    Prior to Admission medications   Medication Sig Start Date End Date Taking? Authorizing Provider  ergocalciferol (VITAMIN D2) 50000 units capsule Take 1 capsule (50,000 Units total) by mouth once a week. One capsule once weekly 07/11/18   Kerri Perches, MD  ibuprofen (ADVIL,MOTRIN) 600 MG tablet One tablet at headache onset, may repeat after 4 hours once if headache persists, maximum tweiceweekly 07/07/18   Kerri Perches, MD    MedroxyPROGESTERone Acetate 150 MG/ML SUSY INJECT 1 ML INTRAMUSCULARLY ONCE EVERY 3 MONTHS. 01/20/18   Adline Potter, NP  pantoprazole (PROTONIX) 20 MG tablet Take 1 tablet (20 mg total) by mouth daily. 07/07/18   Kerri Perches, MD    Family History No family history on file.  Social History Social History   Tobacco Use  . Smoking status: Current Every Day Smoker    Packs/day: 0.25    Years: 3.00    Pack years: 0.75    Types: Cigarettes    Start date: 09/21/2001  . Smokeless tobacco: Never Used  Substance Use Topics  . Alcohol use: Not Currently    Comment: 32 ounces beer and half pint liquor  . Drug use: Not Currently    Types: Marijuana     Allergies   Patient has no known allergies.   Review of Systems Review of Systems  Constitutional: Positive for chills and diaphoresis. Negative for activity change.       All ROS Neg except as noted in HPI  HENT: Positive for congestion. Negative for nosebleeds.   Eyes: Negative for photophobia and discharge.  Respiratory: Positive for cough and wheezing. Negative for shortness of breath.   Cardiovascular:  Negative for chest pain and palpitations.  Gastrointestinal: Negative for abdominal pain and blood in stool.  Genitourinary: Negative for dysuria, frequency and hematuria.  Musculoskeletal: Positive for myalgias. Negative for arthralgias, back pain and neck pain.  Skin: Negative.   Neurological: Negative for dizziness, seizures and speech difficulty.  Psychiatric/Behavioral: Negative for confusion and hallucinations.     Physical Exam Updated Vital Signs BP 126/80 (BP Location: Left Arm)   Pulse (!) 111   Temp 98.9 F (37.2 C) (Oral)   Resp 18   Ht 5\' 1"  (1.549 m)   Wt 61.2 kg   SpO2 98%   BMI 25.51 kg/m   Physical Exam Vitals signs and nursing note reviewed.  Constitutional:      Appearance: She is well-developed. She is not toxic-appearing.  HENT:     Head: Normocephalic.     Right Ear: Tympanic  membrane and external ear normal.     Left Ear: Tympanic membrane and external ear normal.     Nose: Congestion present.  Eyes:     General: Lids are normal.     Pupils: Pupils are equal, round, and reactive to light.  Neck:     Musculoskeletal: Normal range of motion and neck supple.     Vascular: No carotid bruit.  Cardiovascular:     Rate and Rhythm: Normal rate and regular rhythm.     Pulses: Normal pulses.     Heart sounds: Normal heart sounds.  Pulmonary:     Effort: No respiratory distress.     Breath sounds: Rhonchi present.  Abdominal:     General: Bowel sounds are normal.     Palpations: Abdomen is soft.     Tenderness: There is no abdominal tenderness. There is no guarding.  Musculoskeletal: Normal range of motion.  Lymphadenopathy:     Head:     Right side of head: No submandibular adenopathy.     Left side of head: No submandibular adenopathy.     Cervical: No cervical adenopathy.  Skin:    General: Skin is warm and dry.  Neurological:     Mental Status: She is alert and oriented to person, place, and time.     Cranial Nerves: No cranial nerve deficit.     Sensory: No sensory deficit.  Psychiatric:        Speech: Speech normal.      ED Treatments / Results  Labs (all labs ordered are listed, but only abnormal results are displayed) Labs Reviewed - No data to display  EKG None  Radiology No results found.  Procedures Procedures (including critical care time)  Medications Ordered in ED Medications - No data to display   Initial Impression / Assessment and Plan / ED Course  I have reviewed the triage vital signs and the nursing notes.  Pertinent labs & imaging results that were available during my care of the patient were reviewed by me and considered in my medical decision making (see chart for details).       Final Clinical Impressions(s) / ED Diagnoses MDM Vital signs reviewed.  Pulse oximetry is 99% on room air.  Within normal limits by  my interpretation. Patient has had problems with upper respiratory symptoms.  There is been no severe shortness of breath, no loss of consciousness.  Patient is drinking fluids, no excessive diarrhea or vomiting noted.  The examination favors influenza-like illness.  Prescription for Decadron, Claritin-D, Promethazine DM, given to the patient.  Patient use Tylenol extra strength every 4  hours for fever, and/or aching.  We discussed the importance of good hydration and good handwashing.  A mask has been provided for the patient.  Questions were answered.  Patient to see the primary physician or return to the emergency department if any changes in condition, problems, or concerns.     Final diagnoses:  Influenza-like illness    ED Discharge Orders         Ordered    dexamethasone (DECADRON) 4 MG tablet  2 times daily with meals     02/02/19 1036    loratadine-pseudoephedrine (CLARITIN-D 12 HOUR) 5-120 MG tablet  2 times daily     02/02/19 1036    promethazine-dextromethorphan (PROMETHAZINE-DM) 6.25-15 MG/5ML syrup  4 times daily PRN     02/02/19 1036    ibuprofen (ADVIL,MOTRIN) 600 MG tablet  4 times daily     02/02/19 1036           Ivery QualeBryant, Shasta Chinn, PA-C 02/03/19 0816    Samuel JesterMcManus, Kathleen, DO 02/04/19 1905

## 2019-03-18 ENCOUNTER — Other Ambulatory Visit: Payer: Self-pay | Admitting: Family Medicine

## 2019-03-18 DIAGNOSIS — R1013 Epigastric pain: Secondary | ICD-10-CM

## 2019-03-23 ENCOUNTER — Other Ambulatory Visit: Payer: Self-pay | Admitting: Adult Health

## 2019-03-24 ENCOUNTER — Encounter: Payer: Self-pay | Admitting: *Deleted

## 2019-03-24 ENCOUNTER — Other Ambulatory Visit: Payer: Self-pay

## 2019-03-24 ENCOUNTER — Ambulatory Visit (INDEPENDENT_AMBULATORY_CARE_PROVIDER_SITE_OTHER): Payer: Medicaid Other | Admitting: *Deleted

## 2019-03-24 DIAGNOSIS — Z3042 Encounter for surveillance of injectable contraceptive: Secondary | ICD-10-CM

## 2019-03-24 MED ORDER — MEDROXYPROGESTERONE ACETATE 150 MG/ML IM SUSP
150.0000 mg | Freq: Once | INTRAMUSCULAR | Status: AC
  Administered 2019-03-24: 150 mg via INTRAMUSCULAR

## 2019-03-24 NOTE — Progress Notes (Signed)
Depo Provera 150mg IM given in right deltoid with no complications. Pt to return in 12 weeks for next injection.  

## 2019-06-12 ENCOUNTER — Telehealth: Payer: Self-pay | Admitting: Obstetrics and Gynecology

## 2019-06-12 NOTE — Telephone Encounter (Signed)

## 2019-06-16 ENCOUNTER — Ambulatory Visit (INDEPENDENT_AMBULATORY_CARE_PROVIDER_SITE_OTHER): Payer: Medicaid Other | Admitting: *Deleted

## 2019-06-16 ENCOUNTER — Other Ambulatory Visit: Payer: Self-pay

## 2019-06-16 DIAGNOSIS — Z3042 Encounter for surveillance of injectable contraceptive: Secondary | ICD-10-CM

## 2019-06-16 MED ORDER — MEDROXYPROGESTERONE ACETATE 150 MG/ML IM SUSP
150.0000 mg | Freq: Once | INTRAMUSCULAR | Status: AC
  Administered 2019-06-16: 150 mg via INTRAMUSCULAR

## 2019-06-16 NOTE — Progress Notes (Signed)
Depo Provera 150mg IM given in right deltoid with no complications. Pt to return in 12 weeks for next injection.  

## 2019-09-07 ENCOUNTER — Telehealth: Payer: Self-pay | Admitting: Obstetrics & Gynecology

## 2019-09-07 NOTE — Telephone Encounter (Signed)
Called patient regarding appointment scheduled in our office encouraged to come alone to the visit if possible, however, a support person, over age 35, may accompany her  to appointment if assistance is needed for safety or care concerns. Otherwise, support persons should remain outside until the visit is complete.  ° °We ask if you have had any exposure to anyone suspected or confirmed of having COVID-19 or if you are experiencing any of the following, to call and reschedule your appointment: fever, cough, shortness of breath, muscle pain, diarrhea, rash, vomiting, abdominal pain, red eye, weakness, bruising, bleeding, joint pain, or a severe headache.  ° °Please know we will ask you these questions or similar questions when you arrive for your appointment and again it’s how we are keeping everyone safe.   ° °Also,to keep you safe, please use the provided hand sanitizer when you enter the office. We are asking everyone in the office to wear a mask to help prevent the spread of °germs. If you have a mask of your own, please wear it to your appointment, if not, we are happy to provide one for you. ° °Thank you for understanding and your cooperation.  ° ° °[facility] Staff ° ° ° °

## 2019-09-08 ENCOUNTER — Other Ambulatory Visit: Payer: Self-pay | Admitting: Adult Health

## 2019-09-08 ENCOUNTER — Ambulatory Visit: Payer: Medicaid Other

## 2019-09-09 ENCOUNTER — Ambulatory Visit (INDEPENDENT_AMBULATORY_CARE_PROVIDER_SITE_OTHER): Payer: Medicaid Other | Admitting: *Deleted

## 2019-09-09 ENCOUNTER — Other Ambulatory Visit: Payer: Self-pay

## 2019-09-09 VITALS — BP 157/97

## 2019-09-09 DIAGNOSIS — Z3042 Encounter for surveillance of injectable contraceptive: Secondary | ICD-10-CM

## 2019-09-09 MED ORDER — MEDROXYPROGESTERONE ACETATE 150 MG/ML IM SUSP
150.0000 mg | Freq: Once | INTRAMUSCULAR | Status: AC
  Administered 2019-09-09: 150 mg via INTRAMUSCULAR

## 2019-09-09 NOTE — Progress Notes (Signed)
Depo Provera 150mg  IM given in left deltoid with no complications. Pt advised to make P&P appointment for BP elevation eval. Verbalized understanding.

## 2019-09-30 ENCOUNTER — Other Ambulatory Visit: Payer: Self-pay | Admitting: Family Medicine

## 2019-10-05 ENCOUNTER — Emergency Department (HOSPITAL_COMMUNITY)
Admission: EM | Admit: 2019-10-05 | Discharge: 2019-10-05 | Disposition: A | Discharge status: Home / Self Care | Payer: Medicaid Other | Attending: Emergency Medicine | Admitting: Emergency Medicine

## 2019-10-05 ENCOUNTER — Encounter (HOSPITAL_COMMUNITY): Payer: Self-pay | Admitting: Emergency Medicine

## 2019-10-05 ENCOUNTER — Other Ambulatory Visit: Payer: Self-pay

## 2019-10-05 DIAGNOSIS — R112 Nausea with vomiting, unspecified: Secondary | ICD-10-CM | POA: Insufficient documentation

## 2019-10-05 DIAGNOSIS — R109 Unspecified abdominal pain: Secondary | ICD-10-CM | POA: Insufficient documentation

## 2019-10-05 DIAGNOSIS — Z87891 Personal history of nicotine dependence: Secondary | ICD-10-CM | POA: Insufficient documentation

## 2019-10-05 DIAGNOSIS — Z79899 Other long term (current) drug therapy: Secondary | ICD-10-CM | POA: Insufficient documentation

## 2019-10-05 DIAGNOSIS — R1110 Vomiting, unspecified: Secondary | ICD-10-CM | POA: Diagnosis present

## 2019-10-05 LAB — URINALYSIS, ROUTINE W REFLEX MICROSCOPIC
Bilirubin Urine: NEGATIVE
Glucose, UA: NEGATIVE mg/dL
Hgb urine dipstick: NEGATIVE
Ketones, ur: NEGATIVE mg/dL
Nitrite: NEGATIVE
Protein, ur: 100 mg/dL — AB
Specific Gravity, Urine: 1.027 (ref 1.005–1.030)
pH: 6 (ref 5.0–8.0)

## 2019-10-05 LAB — POC URINE PREG, ED: Preg Test, Ur: NEGATIVE

## 2019-10-05 MED ORDER — PROMETHAZINE HCL 25 MG PO TABS: 25.0000 mg | ORAL_TABLET | Freq: Four times a day (QID) | ORAL | 0 refills | Status: DC | PRN

## 2019-10-05 MED ORDER — ONDANSETRON HCL 4 MG/2ML IJ SOLN
4.0000 mg | Freq: Once | INTRAMUSCULAR | Status: AC
  Administered 2019-10-05: 4 mg via INTRAVENOUS
  Filled 2019-10-05: qty 2

## 2019-10-05 MED ORDER — SODIUM CHLORIDE 0.9 % IV BOLUS
1000.0000 mL | Freq: Once | INTRAVENOUS | Status: AC
  Administered 2019-10-05: 1000 mL via INTRAVENOUS

## 2019-10-05 NOTE — ED Provider Notes (Signed)
[facility] EMERGENCY DEPARTMENT Provider Note   CSN: [id number] Arrival date & time: 10/05/19  0730     History   Chief Complaint Chief Complaint  Patient presents with  . Emesis    HPI Jaclyn Howe is a 33 y.o. female.     HPI   Jaclyn Howe is a 33 y.o. female with past medical history of chronic abdominal pain and headaches, presents to the Emergency Department complaining of persistent vomiting since 3 AM this morning.  She reports that multiple episodes of vomiting has not been able to keep down food or fluids since onset.  She states that she went to a cookout yesterday and she is unsure if she ate something that made her sick.  She also complains of a frontal headache, onset after several episodes of vomiting.  She describes the headache as dull.  No neck pain or stiffness.  She also reports dark-colored urine recently without increased frequency or burning with urination.  No flank pain. She denies abdominal pain, fever, chills, chest pain or shortness of breath, and hematochezia.   Past Medical History:  Diagnosis Date  . Chronic abdominal pain   . Chronic headache   . Nausea and vomiting    Recurrent    Patient Active Problem List   Diagnosis Date Noted  . Epigastric pain 07/07/2018  . Allergic rhinitis 06/22/2016  . NICOTINE ADDICTION 07/13/2008  . Migraine headache 07/13/2008    Past Surgical History:  Procedure Laterality Date  . ELECTIVE ABORTION       OB History    Gravida  1   Para  1   Term  1   Preterm      AB      Living  1     SAB      TAB      Ectopic      Multiple      Live Births               Home Medications    Prior to Admission medications   Medication Sig Start Date End Date Taking? Authorizing Provider  dexamethasone (DECADRON) 4 MG tablet Take 1 tablet (4 mg total) by mouth 2 (two) times daily with a meal. 02/02/19   Ivery QualeBryant, Hobson, PA-C  ergocalciferol (VITAMIN D2) 50000 units capsule Take 1  capsule (50,000 Units total) by mouth once a week. One capsule once weekly 07/11/18   Kerri PerchesSimpson, Margaret E, MD  ibuprofen (ADVIL,MOTRIN) 600 MG tablet Take 1 tablet (600 mg total) by mouth 4 (four) times daily. Patient not taking: Reported on 03/24/2019 02/02/19   Ivery QualeBryant, Hobson, PA-C  loratadine-pseudoephedrine (CLARITIN-D 12 HOUR) 5-120 MG tablet Take 1 tablet by mouth 2 (two) times daily. 02/02/19   Ivery QualeBryant, Hobson, PA-C  medroxyPROGESTERone Acetate 150 MG/ML SUSY INJECT 1 ML INTRAMUSCULARLY ONCE EVERY 3 MONTHS. 09/08/19   Cheral MarkerBooker, Kimberly R, CNM  pantoprazole (PROTONIX) 20 MG tablet TAKE 1 TABLET BY MOUTH ONCE A DAY. 03/18/19   Kerri PerchesSimpson, Margaret E, MD  promethazine-dextromethorphan (PROMETHAZINE-DM) 6.25-15 MG/5ML syrup Take 5 mLs by mouth 4 (four) times daily as needed. Patient not taking: Reported on 06/16/2019 02/02/19   Ivery QualeBryant, Hobson, PA-C    Family History No family history on file.  Social History Social History   Tobacco Use  . Smoking status: Former Smoker    Packs/day: 0.25    Years: 3.00    Pack years: 0.75    Types: Cigarettes    Start date: 09/21/2001  . Smokeless  tobacco: Never Used  Substance Use Topics  . Alcohol use: Not Currently    Comment: 32 ounces beer and half pint liquor  . Drug use: Not Currently    Types: Marijuana     Allergies   Patient has no known allergies.   Review of Systems Review of Systems  Constitutional: Negative for appetite change, chills and fever.  HENT: Negative for sore throat and trouble swallowing.   Respiratory: Negative for shortness of breath.   Cardiovascular: Negative for chest pain.  Gastrointestinal: Positive for nausea and vomiting. Negative for abdominal pain, blood in stool, constipation and diarrhea.  Genitourinary: Negative for decreased urine volume, difficulty urinating, dysuria, flank pain, vaginal bleeding and vaginal discharge.       Dark-colored urine  Musculoskeletal: Negative for back pain.  Skin: Negative for  color change and rash.  Neurological: Negative for dizziness, weakness and numbness.  Hematological: Negative for adenopathy.     Physical Exam Updated Vital Signs BP (!) 129/96 (BP Location: Right Arm)   Pulse 100   Temp 98.2 F (36.8 C) (Oral)   Resp 18   Ht 5\' 5"  (1.651 m)   Wt 50 kg   SpO2 99%   BMI 18.34 kg/m   Physical Exam Vitals signs and nursing note reviewed.  Constitutional:      General: She is not in acute distress.    Appearance: Normal appearance. She is well-developed. She is not toxic-appearing.  HENT:     Head: Normocephalic.     Mouth/Throat:     Mouth: Mucous membranes are dry.     Pharynx: Oropharynx is clear.  Cardiovascular:     Rate and Rhythm: Normal rate and regular rhythm.     Pulses: Normal pulses.  Pulmonary:     Effort: Pulmonary effort is normal. No respiratory distress.     Breath sounds: Normal breath sounds.  Abdominal:     General: Bowel sounds are normal. There is no distension.     Palpations: Abdomen is soft. There is no mass.     Tenderness: There is no abdominal tenderness. There is no guarding or rebound.  Musculoskeletal: Normal range of motion.     Right lower leg: No edema.     Left lower leg: No edema.  Skin:    General: Skin is warm.     Findings: No rash.  Neurological:     Mental Status: She is alert and oriented to person, place, and time.     Motor: No abnormal muscle tone.     Coordination: Coordination normal.      ED Treatments / Results  Labs (all labs ordered are listed, but only abnormal results are displayed) Labs Reviewed  URINALYSIS, ROUTINE W REFLEX MICROSCOPIC - Abnormal; Notable for the following components:      Result Value   Color, Urine AMBER (*)    APPearance CLOUDY (*)    Protein, ur 100 (*)    Leukocytes,Ua SMALL (*)    Bacteria, UA RARE (*)    All other components within normal limits  URINE CULTURE  POC URINE PREG, ED    EKG None  Radiology No results found.  Procedures  Procedures (including critical care time)  Medications Ordered in ED Medications  sodium chloride 0.9 % bolus 1,000 mL (has no administration in time range)  ondansetron (ZOFRAN) injection 4 mg (has no administration in time range)     Initial Impression / Assessment and Plan / ED Course  I have reviewed the  triage vital signs and the nursing notes.  Pertinent labs & imaging results that were available during my care of the patient were reviewed by me and considered in my medical decision making (see chart for details).        Patient with multiple episodes of vomiting and appears slightly dehydrated.  Vital signs reviewed.  Abdomen is soft and nontender, no peritoneal signs.  Mucous membranes are moist.  No concerning symptoms for acute surgical abdomen.   1150 on recheck, patient reports feeling better after IV fluids and antiemetic.  No vomiting during stay.  Oral fluid challenge has been tolerated well.  Patient states she is ready for discharge home.  She reports dark-colored urine without dysuria symptoms.  I do not feel this is related to UTI, but urine culture is pending.  Patient agrees to discharge home with antiemetic.  Strict return precautions were given.   Final Clinical Impressions(s) / ED Diagnoses   Final diagnoses:  Non-intractable vomiting with nausea, unspecified vomiting type    ED Discharge Orders    None       Kem Parkinson, PA-C 10/05/19 1202    Lajean Saver, MD 10/05/19 1408

## 2019-10-05 NOTE — Discharge Instructions (Addendum)
Frequent small sips of clear fluids today.  Bland diet as tolerated.  Follow-up with your primary doctor for recheck.  Return to ER for any worsening symptoms such as abdominal pain or fever.

## 2019-10-05 NOTE — ED Triage Notes (Signed)
Headache and vomiting since 0300 today.

## 2019-10-06 LAB — URINE CULTURE: Culture: <10000 — AB

## 2019-10-12 ENCOUNTER — Telehealth: Payer: Self-pay | Admitting: Obstetrics and Gynecology

## 2019-10-12 NOTE — Telephone Encounter (Signed)

## 2019-10-13 ENCOUNTER — Ambulatory Visit (INDEPENDENT_AMBULATORY_CARE_PROVIDER_SITE_OTHER): Payer: Medicaid Other | Admitting: Obstetrics and Gynecology

## 2019-10-13 ENCOUNTER — Other Ambulatory Visit (HOSPITAL_COMMUNITY)
Admission: RE | Admit: 2019-10-13 | Discharge: 2019-10-13 | Disposition: A | Discharge status: Home / Self Care | Payer: Medicaid Other | Source: Ambulatory Visit | Attending: Obstetrics and Gynecology | Admitting: Obstetrics and Gynecology

## 2019-10-13 ENCOUNTER — Other Ambulatory Visit: Payer: Self-pay

## 2019-10-13 ENCOUNTER — Encounter: Payer: Self-pay | Admitting: Obstetrics and Gynecology

## 2019-10-13 VITALS — BP 154/94 | Ht 63.0 in | Wt 114.0 lb

## 2019-10-13 DIAGNOSIS — Z01419 Encounter for gynecological examination (general) (routine) without abnormal findings: Secondary | ICD-10-CM | POA: Diagnosis not present

## 2019-10-13 DIAGNOSIS — Z0000 Encounter for general adult medical examination without abnormal findings: Secondary | ICD-10-CM | POA: Diagnosis not present

## 2019-10-13 NOTE — Progress Notes (Signed)
Patient ID: Jaclyn Howe, female   DOB: 10/19/1986, 33 y.o.   MRN: [id number]   Assessment:  Annual Gyn Exam  Plan:  1. pap smear done, next pap due 3 years 2. return annually or prn 3    Annual mammogram advised after age 55 Subjective:  Jaclyn Howe is a 33 y.o. female G1P1001 who presents for annual exam. No LMP recorded. Patient has had an injection. The patient has complaints today of none. BP has been up since the second time she came but other than that has no complaints. No BP monitor at home. She is on the Depo for birth control.   The following portions of the patient's history were reviewed and updated as appropriate: allergies, current medications, past family history, past medical history, past social history, past surgical history and problem list. Past Medical History:  Diagnosis Date   Chronic abdominal pain    Chronic headache    Nausea and vomiting    Recurrent    Past Surgical History:  Procedure Laterality Date   ELECTIVE ABORTION       Current Outpatient Medications:    dexamethasone (DECADRON) 4 MG tablet, Take 1 tablet (4 mg total) by mouth 2 (two) times daily with a meal., Disp: 10 tablet, Rfl: 0   ergocalciferol (VITAMIN D2) 50000 units capsule, Take 1 capsule (50,000 Units total) by mouth once a week. One capsule once weekly, Disp: 12 capsule, Rfl: 3   ibuprofen (ADVIL,MOTRIN) 600 MG tablet, Take 1 tablet (600 mg total) by mouth 4 (four) times daily. (Patient not taking: Reported on 03/24/2019), Disp: 30 tablet, Rfl: 0   loratadine-pseudoephedrine (CLARITIN-D 12 HOUR) 5-120 MG tablet, Take 1 tablet by mouth 2 (two) times daily., Disp: 20 tablet, Rfl: 0   medroxyPROGESTERone Acetate 150 MG/ML SUSY, INJECT 1 ML INTRAMUSCULARLY ONCE EVERY 3 MONTHS., Disp: 1 mL, Rfl: 0   pantoprazole (PROTONIX) 20 MG tablet, TAKE 1 TABLET BY MOUTH ONCE A DAY., Disp: 30 tablet, Rfl: 0   promethazine (PHENERGAN) 25 MG tablet, Take 1 tablet (25 mg total) by mouth  every 6 (six) hours as needed for nausea or vomiting. May cause drowsiness, Disp: 10 tablet, Rfl: 0   promethazine-dextromethorphan (PROMETHAZINE-DM) 6.25-15 MG/5ML syrup, Take 5 mLs by mouth 4 (four) times daily as needed. (Patient not taking: Reported on 06/16/2019), Disp: 118 mL, Rfl: 0  Review of Systems Constitutional: negative Gastrointestinal: negative Genitourinary: normal  Objective:  There were no vitals taken for this visit.   BMI: There is no height or weight on file to calculate BMI.  General Appearance: Alert, appropriate appearance for age. No acute distress HEENT: Grossly normal Neck / Thyroid:  Cardiovascular: RRR; normal S1, S2, no murmur Lungs: CTA bilaterally Back: No CVAT Breast Exam: Biopsy scar on right breast No masses or nodes.No dimpling, nipple retraction or discharge. Gastrointestinal: Soft, non-tender, no masses or organomegaly well-healed C-section scar Pelvic Exam:   VAGINA: strong muscles at vaginal entrance,  CERVIX: normal appearing cervix with good support, multip spots slightly from PAP brush UTERUS: uterus small   PAP: Pap smear done today. Lymphatic Exam: Non-palpable nodes in neck, clavicular, axillary, or inguinal regions  Skin: no rash or abnormalities Neurologic: Normal gait and speech, no tremor  Psychiatric: Alert and oriented, appropriate affect.  Urinalysis:Not done  By signing my name below, I, Arnette Norris, attest that this documentation has been prepared under the direction and in the presence of Tilda Burrow, MD. Electronically Signed: Arnette Norris Medical Scribe. 10/13/19. 1:31 PM.  I personally performed the services described in this documentation, which was SCRIBED in my presence. The recorded information has been reviewed and considered accurate. It has been edited as necessary during review. Jonnie Kind, MD

## 2019-10-15 LAB — CYTOLOGY - PAP
Chlamydia: NEGATIVE
Comment: NEGATIVE
Comment: NEGATIVE
Comment: NORMAL
Diagnosis: NEGATIVE
High risk HPV: NEGATIVE
Neisseria Gonorrhea: NEGATIVE

## 2019-12-02 ENCOUNTER — Other Ambulatory Visit: Payer: Self-pay | Admitting: Obstetrics and Gynecology

## 2019-12-02 ENCOUNTER — Ambulatory Visit: Payer: Medicaid Other

## 2019-12-02 ENCOUNTER — Other Ambulatory Visit: Payer: Self-pay

## 2019-12-02 ENCOUNTER — Other Ambulatory Visit: Payer: Self-pay | Admitting: Women's Health

## 2019-12-02 ENCOUNTER — Encounter: Payer: Self-pay | Admitting: *Deleted

## 2019-12-02 DIAGNOSIS — Z20822 Contact with and (suspected) exposure to covid-19: Secondary | ICD-10-CM

## 2019-12-02 NOTE — Telephone Encounter (Signed)
refil for 2 Depo Provera Rx sent to pharmacy

## 2019-12-03 ENCOUNTER — Telehealth: Payer: Self-pay | Admitting: General Practice

## 2019-12-03 LAB — NOVEL CORONAVIRUS, NAA: SARS-CoV-2, NAA: NOT DETECTED

## 2019-12-03 NOTE — Telephone Encounter (Signed)
Negative COVID results given. Patient results "NOT Detected." Caller expressed understanding. ° °

## 2019-12-07 ENCOUNTER — Ambulatory Visit: Payer: Medicaid Other

## 2019-12-10 ENCOUNTER — Ambulatory Visit (INDEPENDENT_AMBULATORY_CARE_PROVIDER_SITE_OTHER): Payer: Medicaid Other | Admitting: *Deleted

## 2019-12-10 ENCOUNTER — Other Ambulatory Visit: Payer: Self-pay

## 2019-12-10 DIAGNOSIS — Z3042 Encounter for surveillance of injectable contraceptive: Secondary | ICD-10-CM | POA: Diagnosis not present

## 2019-12-10 MED ORDER — MEDROXYPROGESTERONE ACETATE 150 MG/ML IM SUSP
150.0000 mg | Freq: Once | INTRAMUSCULAR | Status: AC
  Administered 2019-12-10: 14:00:00 150 mg via INTRAMUSCULAR

## 2019-12-10 NOTE — Progress Notes (Signed)
   NURSE VISIT- INJECTION  SUBJECTIVE:  Jaclyn Howe is a 33 y.o. G41P1001 female here for a Depo Provera for contraception/period management. She is a GYN patient.   OBJECTIVE:  There were no vitals taken for this visit.  Appears well, in no apparent distress  Injection administered in: Right deltoid  No orders of the defined types were placed in this encounter.   ASSESSMENT: GYN patient Depo Provera for contraception/period management PLAN: Follow-up: in 11-13 weeks for next Depo   Andie Mortimer, Celene Squibb  12/10/2019 2:21 PM

## 2020-03-07 ENCOUNTER — Other Ambulatory Visit: Payer: Self-pay | Admitting: Obstetrics & Gynecology

## 2020-03-09 ENCOUNTER — Ambulatory Visit (INDEPENDENT_AMBULATORY_CARE_PROVIDER_SITE_OTHER): Payer: Medicaid Other

## 2020-03-09 ENCOUNTER — Other Ambulatory Visit: Payer: Self-pay

## 2020-03-09 VITALS — Wt 119.0 lb

## 2020-03-09 DIAGNOSIS — Z3042 Encounter for surveillance of injectable contraceptive: Secondary | ICD-10-CM

## 2020-03-09 MED ORDER — MEDROXYPROGESTERONE ACETATE 150 MG/ML IM SUSP
150.0000 mg | Freq: Once | INTRAMUSCULAR | Status: AC
  Administered 2020-03-09: 150 mg via INTRAMUSCULAR

## 2020-03-09 NOTE — Progress Notes (Signed)
   NURSE VISIT- INJECTION  SUBJECTIVE:  Jaclyn Howe is a 34 y.o. G20P1001 female here for depo injection. She is a GYN patient.  OBJECTIVE:  There were no vitals taken for this visit.  Appears well, in no apparent distress  Injection administered in: Left deltoid  No orders of the defined types were placed in this encounter.   ASSESSMENT: GYN patient Depo Provera for contraception/period management PLAN: Follow-up: in 11-13 weeks for next Depo   Rennis Petty  03/09/2020 1:56 PM

## 2020-05-25 ENCOUNTER — Ambulatory Visit (INDEPENDENT_AMBULATORY_CARE_PROVIDER_SITE_OTHER): Payer: Medicaid Other | Admitting: *Deleted

## 2020-05-25 DIAGNOSIS — Z3042 Encounter for surveillance of injectable contraceptive: Secondary | ICD-10-CM | POA: Diagnosis not present

## 2020-05-25 MED ORDER — MEDROXYPROGESTERONE ACETATE 150 MG/ML IM SUSP
150.0000 mg | Freq: Once | INTRAMUSCULAR | Status: AC
  Administered 2020-05-25: 150 mg via INTRAMUSCULAR

## 2020-05-25 NOTE — Progress Notes (Signed)
° °  NURSE VISIT- INJECTION  SUBJECTIVE:  Jaclyn Howe is a 34 y.o. G89P1001 female here for a Depo Provera for contraception/period management. She is a GYN patient.   OBJECTIVE:  There were no vitals taken for this visit.  Appears well, in no apparent distress  Injection administered in: Right Deltoid  Meds ordered this encounter  Medications   medroxyPROGESTERone (DEPO-PROVERA) injection 150 mg    ASSESSMENT: GYN patient Depo Provera for contraception/period management PLAN: Follow-up: in 11-13 weeks for next Depo   Annamarie Dawley  05/25/2020 2:05 PM

## 2020-06-29 ENCOUNTER — Telehealth: Payer: Self-pay

## 2020-06-29 NOTE — Telephone Encounter (Signed)
Talked with patient and she said she will be sure to make the appointment.  I told her that we discharged her in the past due to No Shows.  She does have a transpration problem but says she will make sure she is at her appointments.

## 2020-07-14 ENCOUNTER — Ambulatory Visit: Payer: Medicaid Other | Admitting: Family Medicine

## 2020-07-22 ENCOUNTER — Other Ambulatory Visit: Payer: Self-pay

## 2020-07-22 ENCOUNTER — Encounter: Payer: Self-pay | Admitting: Family Medicine

## 2020-07-22 ENCOUNTER — Ambulatory Visit (INDEPENDENT_AMBULATORY_CARE_PROVIDER_SITE_OTHER): Payer: Medicaid Other | Admitting: Family Medicine

## 2020-07-22 VITALS — BP 146/92 | HR 65 | Temp 97.4°F | Resp 16 | Ht 61.0 in | Wt 116.1 lb

## 2020-07-22 DIAGNOSIS — I10 Essential (primary) hypertension: Secondary | ICD-10-CM | POA: Insufficient documentation

## 2020-07-22 DIAGNOSIS — F17200 Nicotine dependence, unspecified, uncomplicated: Secondary | ICD-10-CM

## 2020-07-22 DIAGNOSIS — R61 Generalized hyperhidrosis: Secondary | ICD-10-CM | POA: Diagnosis not present

## 2020-07-22 MED ORDER — AMLODIPINE BESYLATE 5 MG PO TABS: 5.0000 mg | ORAL_TABLET | Freq: Every day | ORAL | 0 refills | Status: DC

## 2020-07-22 NOTE — Patient Instructions (Signed)
I appreciate the opportunity to provide you with care for your health and wellness. Today we discussed: Establish care  Follow up: 10 weeks for BP/ new med check  No labs  Referrals today-dermatology for hands when  GREAT TO MEET YOU AND YOUR SON TODAY!  HAVE A GOOD REST OF SUMMER :)  STAY SAFE.  Please continue to practice social distancing to keep you, your family, and our community safe.  If you must go out, please wear a mask and practice good handwashing.  It was a pleasure to see you and I look forward to continuing to work together on your health and well-being. Please do not hesitate to call the office if you need care or have questions about your care.  Have a wonderful day and week. With Gratitude, Tereasa Coop, DNP, AGNP-BC

## 2020-07-22 NOTE — Assessment & Plan Note (Signed)
Blood pressure not well controlled over the last several visits in epic.  Will be starting low dose Norvasc to see if we can get this better controlled.  Educated on not using salt/fried foods.  And exercise and reduction in smoking.

## 2020-07-22 NOTE — Assessment & Plan Note (Signed)
She has excessive sweating in her hands.  We will send her to dermatology to see if there is anything that they could possibly suggest for this.

## 2020-07-22 NOTE — Progress Notes (Signed)
Subjective:  Patient ID: Jaclyn Howe, female    DOB: 1986/02/19  Age: 34 y.o. MRN: [id number]  CC:  Chief Complaint  Patient presents with  . New Patient (Initial Visit)    new pt former dr simpson pt only concerns are hands are sweating all the time and would like to know why       HPI  HPI   Jaclyn Howe is a 34 year old female patient.  She is a previous patient of Dr. Anthony Sar.  Was last seen in 2019.  She presents today to reestablish care here.  [facility].  Has a history of allergies, nicotine dependence, migraines.  Has been having more recent elevation of blood pressures.  Is willing to go on blood pressure medication.  Additionally has been having a lot of hand sweating she reports that sweats all the time but the only area of her body that she is having this issue with she is open to referral to dermatology.  She denies having trouble sleeping.  Denies have any trouble chewing swallowing or eating.  Does not have any dentition issues.  Reports she sees a dentist.  Does not have any changes in bowel or bladder habits.  No blood in urine or stool.  Not having any memory changes.  Denies having any injuries or falls.  Does not have any skin issues.  Does not have any hearing or vision changes.  Does not like to get vaccines.  Is up-to-date on all other health care gaps.  Today patient denies signs and symptoms of COVID 19 infection including fever, chills, cough, shortness of breath, and headache. Past Medical, Surgical, Social History, Allergies, and Medications have been Reviewed.   Past Medical History:  Diagnosis Date  . Allergic rhinitis 06/22/2016  . Chronic abdominal pain   . Chronic headache   . Epigastric pain 07/07/2018  . Migraine headache 07/13/2008   Qualifier: Diagnosis of  By: Lind Guest    . Nausea and vomiting    Recurrent    Current Meds  Medication Sig  . medroxyPROGESTERone Acetate 150 MG/ML  SUSY INJECT 1 ML INTRAMUSCULARLY ONCE EVERY 3 MONTHS.    ROS:  Review of Systems  HENT: Negative.   Eyes: Negative.   Respiratory: Negative.   Cardiovascular: Negative.   Gastrointestinal: Negative.   Genitourinary: Negative.   Musculoskeletal: Negative.   Skin: Negative.        Hands are sweaty   Neurological: Negative.   Endo/Heme/Allergies: Negative.   Psychiatric/Behavioral: Negative.   All other systems reviewed and are negative.    Objective:   Today's Vitals: BP (!) 146/92 (BP Location: Left Arm, Patient Position: Sitting, Cuff Size: Normal)   Pulse 65   Temp (!) 97.4 F (36.3 C) (Temporal)   Resp 16   Ht 5\' 1"  (1.549 m)   Wt 116 lb 1.9 oz (52.7 kg)   SpO2 99%   BMI 21.94 kg/m  Vitals with BMI 07/22/2020 03/09/2020 10/13/2019  Height 5\' 1"  - 5\' 3"   Weight 116 lbs 2 oz 119 lbs 114 lbs  BMI 21.95 - 20.2  Systolic 146 - 154  Diastolic 92 - 94  Pulse 65 - -     Physical Exam Vitals and nursing note reviewed.  Constitutional:      Appearance: Normal appearance. She is well-developed, well-groomed and normal weight.  HENT:     Head: Normocephalic and atraumatic.     Right Ear:  External ear normal.     Left Ear: External ear normal.     Mouth/Throat:     Comments: Mask in place  Eyes:     General:        Right eye: No discharge.        Left eye: No discharge.     Conjunctiva/sclera: Conjunctivae normal.  Cardiovascular:     Rate and Rhythm: Normal rate and regular rhythm.     Pulses: Normal pulses.     Heart sounds: Normal heart sounds.  Pulmonary:     Effort: Pulmonary effort is normal.     Breath sounds: Normal breath sounds.  Musculoskeletal:        General: Normal range of motion.     Cervical back: Normal range of motion and neck supple.  Skin:    General: Skin is warm.  Neurological:     General: No focal deficit present.     Mental Status: She is alert and oriented to person, place, and time.  Psychiatric:        Attention and Perception:  Attention normal.        Mood and Affect: Mood normal.        Speech: Speech normal.        Behavior: Behavior normal. Behavior is cooperative.        Thought Content: Thought content normal.        Cognition and Memory: Cognition normal.        Judgment: Judgment normal.     Assessment   1. Essential hypertension   2. Excessive sweating   3. NICOTINE ADDICTION     Tests ordered Orders Placed This Encounter  Procedures  . Ambulatory referral to Dermatology    Plan: Please see assessment and plan per problem list above.   Meds ordered this encounter  Medications  . amLODipine (NORVASC) 5 MG tablet    Sig: Take 1 tablet (5 mg total) by mouth daily.    Dispense:  90 tablet    Refill:  0    Order Specific Question:   Supervising Provider    Answer:   Kerri Perches [[id number]]    Patient to follow-up in 10-11 weeks for BP   Freddy Finner, NP

## 2020-07-22 NOTE — Assessment & Plan Note (Signed)
Asked about quitting: confirms they are currently smokes cigarettes Advise to quit smoking: Educated about QUITTING to reduce the risk of cancer, cardio and cerebrovascular disease. Assess willingness: Unwilling to quit at this time, but is working on cutting back. Assist with counseling and pharmacotherapy: Counseled for 5 minutes and literature provided. Arrange for follow up:  not quitting follow up in 3 months and continue to offer help.   

## 2020-08-17 ENCOUNTER — Ambulatory Visit (INDEPENDENT_AMBULATORY_CARE_PROVIDER_SITE_OTHER): Payer: Medicaid Other | Admitting: *Deleted

## 2020-08-17 DIAGNOSIS — Z3042 Encounter for surveillance of injectable contraceptive: Secondary | ICD-10-CM | POA: Diagnosis not present

## 2020-08-17 MED ORDER — MEDROXYPROGESTERONE ACETATE 150 MG/ML IM SUSP
150.0000 mg | Freq: Once | INTRAMUSCULAR | Status: AC
  Administered 2020-08-17: 150 mg via INTRAMUSCULAR

## 2020-08-17 NOTE — Progress Notes (Signed)
   NURSE VISIT- INJECTION  SUBJECTIVE:  Jaclyn Howe is a 34 y.o. G3P1001 female here for a Depo Provera for contraception/period management. She is a GYN patient.   OBJECTIVE:  There were no vitals taken for this visit.  Appears well, in no apparent distress  Injection administered in: Right deltoid  Meds ordered this encounter  Medications  . medroxyPROGESTERone (DEPO-PROVERA) injection 150 mg    ASSESSMENT: GYN patient Depo Provera for contraception/period management PLAN: Follow-up: in 11-13 weeks for next Depo   Jobe Marker  08/17/2020 2:19 PM

## 2020-09-21 DIAGNOSIS — L74512 Primary focal hyperhidrosis, palms: Secondary | ICD-10-CM | POA: Diagnosis not present

## 2020-09-21 DIAGNOSIS — L74513 Primary focal hyperhidrosis, soles: Secondary | ICD-10-CM | POA: Diagnosis not present

## 2020-09-26 ENCOUNTER — Other Ambulatory Visit: Payer: Self-pay | Admitting: *Deleted

## 2020-09-30 ENCOUNTER — Encounter: Payer: Self-pay | Admitting: Family Medicine

## 2020-09-30 ENCOUNTER — Ambulatory Visit (INDEPENDENT_AMBULATORY_CARE_PROVIDER_SITE_OTHER): Payer: Medicaid Other | Admitting: Family Medicine

## 2020-09-30 ENCOUNTER — Other Ambulatory Visit: Payer: Self-pay

## 2020-09-30 VITALS — BP 118/76 | HR 78 | Resp 16 | Ht 61.0 in | Wt 115.1 lb

## 2020-09-30 DIAGNOSIS — I10 Essential (primary) hypertension: Secondary | ICD-10-CM | POA: Diagnosis not present

## 2020-09-30 DIAGNOSIS — H04123 Dry eye syndrome of bilateral lacrimal glands: Secondary | ICD-10-CM

## 2020-09-30 MED ORDER — AMLODIPINE BESYLATE 5 MG PO TABS: 5.0000 mg | ORAL_TABLET | Freq: Every day | ORAL | 1 refills | Status: DC

## 2020-09-30 NOTE — Assessment & Plan Note (Signed)
Encouraged to get eyedrops to help with this.  Possibly related to blood pressure medicine if it becomes a big issue or becomes uncontrollable or not something treatable we will revisit possible adjustment or change in medication.

## 2020-09-30 NOTE — Assessment & Plan Note (Signed)
Blood pressure much better controlled on low-dose Norvasc.  We will continue this as she is tolerating it well.  Educated on avoidance of salt and fried foods and exercise.  In addition to smoking reduction.  She reports that she does have dry I have encouraged her to treat this with soothing eyedrops and if becomes much of an issue we will readdress in future visits.  She is in agreements with this care plan

## 2020-09-30 NOTE — Patient Instructions (Signed)
  HAPPY FALL!  I appreciate the opportunity to provide you with care for your health and wellness. Today we discussed: BP   Follow up: Jan/Feb for CPE no pap- fasting for labs  HAPPY BIRTHDAY!!!!!!  Glad that BP is better.  Pick up some dry eye soothe eye drops to help with dry eyes  Please continue to practice social distancing to keep you, your family, and our community safe.  If you must go out, please wear a mask and practice good handwashing.  It was a pleasure to see you and I look forward to continuing to work together on your health and well-being. Please do not hesitate to call the office if you need care or have questions about your care.  Have a wonderful day and week. With Gratitude, Tereasa Coop, DNP, AGNP-BC

## 2020-09-30 NOTE — Progress Notes (Signed)
Subjective:  Patient ID: Jaclyn Howe, female    DOB: 08/16/1986  Age: 35 y.o. MRN: [id number]  CC:  Chief Complaint  Patient presents with  . Hypertension    med check, new med check      HPI  HPI Ms Jaclyn Howe is a 34 year old female patient of mine.  She is here for blood pressure follow-up.  She was started on low-dose Norvasc 2 months ago.  She reports she thinks she does feel little bit better as she was not sure that she was having excessively high blood pressures at the time most of the time.  She reports that she does have dry eyes and dry mouth secondary to the medication and would like to know what to about that.  She denies having any chest pain, leg swelling, palpitations, headaches, dizziness, vision changes, cough or shortness of breath. Reports taking all medications as directed and denies side effects.  Today patient denies signs and symptoms of COVID 19 infection including fever, chills, cough, shortness of breath, and headache. Past Medical, Surgical, Social History, Allergies, and Medications have been Reviewed.   Past Medical History:  Diagnosis Date  . Allergic rhinitis 06/22/2016  . Chronic abdominal pain   . Chronic headache   . Epigastric pain 07/07/2018  . Migraine headache 07/13/2008   Qualifier: Diagnosis of  By: Lind Guest    . Nausea and vomiting    Recurrent    Current Meds  Medication Sig  . amLODipine (NORVASC) 5 MG tablet Take 1 tablet (5 mg total) by mouth daily.  . medroxyPROGESTERone Acetate 150 MG/ML SUSY INJECT 1 ML INTRAMUSCULARLY ONCE EVERY 3 MONTHS.  . [DISCONTINUED] amLODipine (NORVASC) 5 MG tablet Take 1 tablet (5 mg total) by mouth daily.    ROS:  Review of Systems  Constitutional: Negative.   HENT: Negative.   Eyes: Negative.        Dry eyes   Respiratory: Negative.   Cardiovascular: Negative.   Gastrointestinal: Negative.   Genitourinary: Negative.   Musculoskeletal: Negative.   Skin: Negative.   Neurological:  Negative.   Endo/Heme/Allergies: Negative.   Psychiatric/Behavioral: Negative.      Objective:   Today's Vitals: BP 118/76   Pulse 78   Resp 16   Ht 5\' 1"  (1.549 m)   Wt 115 lb 1.9 oz (52.2 kg)   SpO2 99%   BMI 21.75 kg/m  Vitals with BMI 09/30/2020 07/22/2020 03/09/2020  Height 5\' 1"  5\' 1"  -  Weight 115 lbs 2 oz 116 lbs 2 oz 119 lbs  BMI 21.76 21.95 -  Systolic 118 146 -  Diastolic 76 92 -  Pulse 78 65 -     Physical Exam Vitals and nursing note reviewed.  Constitutional:      Appearance: Normal appearance. She is well-developed, well-groomed and normal weight.  HENT:     Head: Normocephalic and atraumatic.     Right Ear: External ear normal.     Left Ear: External ear normal.     Mouth/Throat:     Comments: Mask in place  Eyes:     General:        Right eye: No discharge.        Left eye: No discharge.     Conjunctiva/sclera: Conjunctivae normal.  Cardiovascular:     Rate and Rhythm: Normal rate and regular rhythm.     Pulses: Normal pulses.     Heart sounds: Normal heart sounds.  Pulmonary:     Effort:  Pulmonary effort is normal.     Breath sounds: Normal breath sounds.  Musculoskeletal:        General: Normal range of motion.     Cervical back: Normal range of motion and neck supple.  Skin:    General: Skin is warm.  Neurological:     General: No focal deficit present.     Mental Status: She is alert and oriented to person, place, and time.  Psychiatric:        Attention and Perception: Attention normal.        Mood and Affect: Mood normal.        Speech: Speech normal.        Behavior: Behavior normal. Behavior is cooperative.        Thought Content: Thought content normal.        Cognition and Memory: Cognition normal.        Judgment: Judgment normal.     Assessment   1. Essential hypertension   2. Dry eyes     Tests ordered No orders of the defined types were placed in this encounter.    Plan: Please see assessment and plan per  problem list above.   Meds ordered this encounter  Medications  . amLODipine (NORVASC) 5 MG tablet    Sig: Take 1 tablet (5 mg total) by mouth daily.    Dispense:  90 tablet    Refill:  1    Order Specific Question:   Supervising Provider    Answer:   Genia Harold    Patient to follow-up in 01/27/2021  Freddy Finner, NP

## 2020-11-09 ENCOUNTER — Other Ambulatory Visit: Payer: Self-pay

## 2020-11-09 ENCOUNTER — Ambulatory Visit (INDEPENDENT_AMBULATORY_CARE_PROVIDER_SITE_OTHER): Payer: Medicaid Other | Admitting: *Deleted

## 2020-11-09 DIAGNOSIS — Z3042 Encounter for surveillance of injectable contraceptive: Secondary | ICD-10-CM | POA: Diagnosis not present

## 2020-11-09 MED ORDER — MEDROXYPROGESTERONE ACETATE 150 MG/ML IM SUSP
150.0000 mg | Freq: Once | INTRAMUSCULAR | Status: AC
  Administered 2020-11-09: 150 mg via INTRAMUSCULAR

## 2020-11-09 NOTE — Progress Notes (Signed)
   NURSE VISIT- INJECTION  SUBJECTIVE:  Jaclyn Howe is a 34 y.o. G58P1001 female here for a Depo Provera for contraception/period management. She is a GYN patient.   OBJECTIVE:  There were no vitals taken for this visit.  Appears well, in no apparent distress  Injection administered in: Left deltoid  Meds ordered this encounter  Medications  . medroxyPROGESTERone (DEPO-PROVERA) injection 150 mg    ASSESSMENT: GYN patient Depo Provera for contraception/period management PLAN: Follow-up: in 11-13 weeks for next Depo   [name] A Brunella Wileman  11/09/2020 2:10 PM

## 2021-01-27 ENCOUNTER — Encounter: Payer: Medicaid Other | Admitting: Family Medicine

## 2021-01-30 ENCOUNTER — Encounter: Payer: Medicaid Other | Admitting: Nurse Practitioner

## 2021-02-01 ENCOUNTER — Ambulatory Visit (INDEPENDENT_AMBULATORY_CARE_PROVIDER_SITE_OTHER): Payer: Medicaid Other | Admitting: *Deleted

## 2021-02-01 ENCOUNTER — Other Ambulatory Visit: Payer: Self-pay

## 2021-02-01 DIAGNOSIS — Z3042 Encounter for surveillance of injectable contraceptive: Secondary | ICD-10-CM | POA: Diagnosis not present

## 2021-02-01 MED ORDER — MEDROXYPROGESTERONE ACETATE 150 MG/ML IM SUSP
150.0000 mg | Freq: Once | INTRAMUSCULAR | Status: AC
  Administered 2021-02-01: 150 mg via INTRAMUSCULAR

## 2021-02-01 NOTE — Progress Notes (Signed)
   NURSE VISIT- INJECTION  SUBJECTIVE:  Jaclyn Howe is a 35 y.o. G19P1001 female here for a Depo Provera for contraception/period management. She is a GYN patient.   OBJECTIVE:  There were no vitals taken for this visit.  Appears well, in no apparent distress  Injection administered in: Right deltoid  Meds ordered this encounter  Medications  . medroxyPROGESTERone (DEPO-PROVERA) injection 150 mg    ASSESSMENT: GYN patient Depo Provera for contraception/period management PLAN: Follow-up: in 11-13 weeks for next Depo   Jobe Marker  02/01/2021 9:12 AM

## 2021-02-03 ENCOUNTER — Ambulatory Visit (INDEPENDENT_AMBULATORY_CARE_PROVIDER_SITE_OTHER): Payer: Medicaid Other | Admitting: Nurse Practitioner

## 2021-02-03 ENCOUNTER — Encounter: Payer: Self-pay | Admitting: Nurse Practitioner

## 2021-02-03 ENCOUNTER — Other Ambulatory Visit: Payer: Self-pay

## 2021-02-03 DIAGNOSIS — I10 Essential (primary) hypertension: Secondary | ICD-10-CM | POA: Diagnosis not present

## 2021-02-03 DIAGNOSIS — Z0000 Encounter for general adult medical examination without abnormal findings: Secondary | ICD-10-CM

## 2021-02-03 NOTE — Assessment & Plan Note (Signed)
-  she is not fasting for labs today -ordered labs and she will have them drawn next week

## 2021-02-03 NOTE — Assessment & Plan Note (Signed)
-  well controlled today -continue amlodipine

## 2021-02-03 NOTE — Progress Notes (Signed)
Established Patient Office Visit  Subjective:  Patient ID: Jaclyn Howe, female    DOB: [birth year]  Age: 35 y.o. MRN: [id number]  CC:  Chief Complaint  Patient presents with  . Annual Exam    CPE    HPI Jaclyn Howe presents for annual physical exam. No acute concerns.  Past Medical History:  Diagnosis Date  . Allergic rhinitis 06/22/2016  . Chronic abdominal pain   . Chronic headache   . Epigastric pain 07/07/2018  . Migraine headache 07/13/2008   Qualifier: Diagnosis of  By: Dierdre Harness    . Nausea and vomiting    Recurrent    Past Surgical History:  Procedure Laterality Date  . ELECTIVE ABORTION      History reviewed. No pertinent family history.  Social History   Socioeconomic History  . Marital status: Single    Spouse name: Not on file  . Number of children: 1  . Years of education: Not on file  . Highest education level: High school graduate  Occupational History  . Not on file  Tobacco Use  . Smoking status: Current Every Day Smoker    Packs/day: 1.00    Years: 3.00    Pack years: 3.00    Types: Cigarettes    Start date: 09/21/2001  . Smokeless tobacco: Never Used  Substance and Sexual Activity  . Alcohol use: Not Currently    Comment: 32 ounces beer and half pint liquor  . Drug use: Not Currently    Types: Marijuana  . Sexual activity: Yes    Birth control/protection: Injection  Other Topics Concern  . Not on file  Social History Narrative   Lives with Ria Clock son 32 years old      Dog: [place]       Enjoy: playing cards, hanging out with friends      Diet: eats all food groups   Caffeine: soda- 2-3 16 oz bottles   Water: 1 bottle a daily       Wear seat belt   Smoke detectors at home   Data processing manager   No weapons     Social Determinants of Health   Financial Resource Strain: Low Risk   . Difficulty of Paying Living Expenses: Not hard at all  Food Insecurity: No Food Insecurity  . Worried About Charity fundraiser in  the Last Year: Never true  . Ran Out of Food in the Last Year: Never true  Transportation Needs: No Transportation Needs  . Lack of Transportation (Medical): No  . Lack of Transportation (Non-Medical): No  Physical Activity: Insufficiently Active  . Days of Exercise per Week: 3 days  . Minutes of Exercise per Session: 20 min  Stress: No Stress Concern Present  . Feeling of Stress : Not at all  Social Connections: Socially Isolated  . Frequency of Communication with Friends and Family: More than three times a week  . Frequency of Social Gatherings with Friends and Family: More than three times a week  . Attends Religious Services: Never  . Active Member of Clubs or Organizations: No  . Attends Archivist Meetings: Never  . Marital Status: Never married  Intimate Partner Violence: Not At Risk  . Fear of Current or Ex-Partner: No  . Emotionally Abused: No  . Physically Abused: No  . Sexually Abused: No    Outpatient Medications Prior to Visit  Medication Sig Dispense Refill  . amLODipine (NORVASC) 5 MG tablet Take 1 tablet (5 mg total)  by mouth daily. 90 tablet 1  . medroxyPROGESTERone Acetate 150 MG/ML SUSY INJECT 1 ML INTRAMUSCULARLY ONCE EVERY 3 MONTHS. 1 mL 3   No facility-administered medications prior to visit.    No Known Allergies  ROS Review of Systems  Constitutional: Negative.   HENT: Negative.   Respiratory: Negative.   Cardiovascular: Negative.   Gastrointestinal: Negative.   Endocrine: Negative.   Genitourinary: Negative.   Musculoskeletal: Negative.   Skin: Negative.   Allergic/Immunologic: Negative.   Neurological: Negative.   Hematological: Negative.   Psychiatric/Behavioral: Negative.       Objective:    Physical Exam Constitutional:      Appearance: Normal appearance.  HENT:     Head: Normocephalic and atraumatic.     Right Ear: Tympanic membrane, ear canal and external ear normal.     Left Ear: Tympanic membrane, ear canal and  external ear normal.     Nose: Nose normal.     Mouth/Throat:     Mouth: Mucous membranes are dry.     Pharynx: Oropharynx is clear.  Eyes:     Extraocular Movements: Extraocular movements intact.     Conjunctiva/sclera: Conjunctivae normal.     Pupils: Pupils are equal, round, and reactive to light.  Cardiovascular:     Rate and Rhythm: Normal rate and regular rhythm.     Pulses: Normal pulses.     Heart sounds: Normal heart sounds.  Pulmonary:     Effort: Pulmonary effort is normal.     Breath sounds: Normal breath sounds.  Abdominal:     General: Abdomen is flat. Bowel sounds are normal.     Palpations: Abdomen is soft.  Musculoskeletal:        General: Normal range of motion.     Cervical back: Normal range of motion and neck supple.  Skin:    General: Skin is warm and dry.     Capillary Refill: Capillary refill takes less than 2 seconds.  Neurological:     General: No focal deficit present.     Mental Status: She is alert and oriented to person, place, and time.  Psychiatric:        Mood and Affect: Mood normal.        Behavior: Behavior normal.        Thought Content: Thought content normal.        Judgment: Judgment normal.     BP 118/76 (BP Location: Right Arm, Patient Position: Sitting, Cuff Size: Normal)   Pulse (!) 104   Temp 98.8 F (37.1 C) (Oral)   Ht 5' 1"  (1.549 m)   Wt 115 lb (52.2 kg)   SpO2 98%   BMI 21.73 kg/m  Wt Readings from Last 3 Encounters:  02/03/21 115 lb (52.2 kg)  09/30/20 115 lb 1.9 oz (52.2 kg)  07/22/20 116 lb 1.9 oz (52.7 kg)     There are no preventive care reminders to display for this patient.  There are no preventive care reminders to display for this patient.  Lab Results  Component Value Date   TSH 1.00 07/07/2018   Lab Results  Component Value Date   WBC 6.4 07/07/2018   HGB 13.9 07/07/2018   HCT 40.5 07/07/2018   MCV 94.8 07/07/2018   PLT 304 07/07/2018   Lab Results  Component Value Date   NA 137  07/07/2018   K 4.8 07/07/2018   CO2 24 07/07/2018   GLUCOSE 97 07/07/2018   BUN 9 07/07/2018   CREATININE  0.71 07/07/2018   BILITOT 0.4 07/07/2018   ALKPHOS 92 06/22/2016   AST 17 07/07/2018   ALT 19 07/07/2018   PROT 7.0 07/07/2018   ALBUMIN 4.5 06/22/2016   CALCIUM 9.5 07/07/2018   ANIONGAP 12 07/31/2015   Lab Results  Component Value Date   CHOL 222 (H) 07/07/2018   Lab Results  Component Value Date   HDL 52 07/07/2018   Lab Results  Component Value Date   LDLCALC 133 (H) 07/07/2018   Lab Results  Component Value Date   TRIG 230 (H) 07/07/2018   Lab Results  Component Value Date   CHOLHDL 4.3 07/07/2018   No results found for: HGBA1C    Assessment & Plan:   Problem List Items Addressed This Visit      Cardiovascular and Mediastinum   Essential hypertension    -well controlled today -continue amlodipine        Other   Annual physical exam    -she is not fasting for labs today -ordered labs and she will have them drawn next week      Relevant Orders   CBC with Differential/Platelet   CMP14+EGFR   Lipid Panel With LDL/HDL Ratio      No orders of the defined types were placed in this encounter.   Follow-up: Return in about 1 year (around 02/03/2022) for Annual Physical Exam.    Noreene Larsson, NP

## 2021-02-03 NOTE — Patient Instructions (Signed)
We will meet back up in a year for a physical exam. We may meet up sooner based on the results of your labs. Please don't eat or drink anything other than water or black coffee 8 hours prior to having your labs drawn.

## 2021-03-02 DIAGNOSIS — Z0000 Encounter for general adult medical examination without abnormal findings: Secondary | ICD-10-CM | POA: Diagnosis not present

## 2021-03-03 LAB — CMP14+EGFR
ALT: 15 IU/L (ref 0–32)
AST: 21 IU/L (ref 0–40)
Albumin/Globulin Ratio: 1.4 (ref 1.2–2.2)
Albumin: 4.3 g/dL (ref 3.8–4.8)
Alkaline Phosphatase: 88 IU/L (ref 44–121)
BUN/Creatinine Ratio: 11 (ref 9–23)
BUN: 8 mg/dL (ref 6–20)
Bilirubin Total: 0.5 mg/dL (ref 0.0–1.2)
CO2: 18 mmol/L — ABNORMAL LOW (ref 20–29)
Calcium: 9.3 mg/dL (ref 8.7–10.2)
Chloride: 105 mmol/L (ref 96–106)
Creatinine, Ser: 0.7 mg/dL (ref 0.57–1.00)
Globulin, Total: 3 g/dL (ref 1.5–4.5)
Glucose: 92 mg/dL (ref 65–99)
Potassium: 4.3 mmol/L (ref 3.5–5.2)
Sodium: 140 mmol/L (ref 134–144)
Total Protein: 7.3 g/dL (ref 6.0–8.5)
eGFR: 115 mL/min/{1.73_m2} (ref >59)

## 2021-03-03 LAB — LIPID PANEL WITH LDL/HDL RATIO
Cholesterol, Total: 223 mg/dL — ABNORMAL HIGH (ref 100–199)
HDL: 62 mg/dL (ref >39)
LDL Chol Calc (NIH): 135 mg/dL — ABNORMAL HIGH (ref 0–99)
LDL/HDL Ratio: 2.2 ratio (ref 0.0–3.2)
Triglycerides: 149 mg/dL (ref 0–149)
VLDL Cholesterol Cal: 26 mg/dL (ref 5–40)

## 2021-03-03 LAB — CBC WITH DIFFERENTIAL/PLATELET
Basophils Absolute: 0 10*3/uL (ref 0.0–0.2)
Basos: 1 %
EOS (ABSOLUTE): 0.1 10*3/uL (ref 0.0–0.4)
Eos: 1 %
Hematocrit: 43.3 % (ref 34.0–46.6)
Hemoglobin: 14.3 g/dL (ref 11.1–15.9)
Immature Grans (Abs): 0 10*3/uL (ref 0.0–0.1)
Immature Granulocytes: 0 %
Lymphocytes Absolute: 2 10*3/uL (ref 0.7–3.1)
Lymphs: 33 %
MCH: 32.4 pg (ref 26.6–33.0)
MCHC: 33 g/dL (ref 31.5–35.7)
MCV: 98 fL — ABNORMAL HIGH (ref 79–97)
Monocytes Absolute: 0.6 10*3/uL (ref 0.1–0.9)
Monocytes: 9 %
Neutrophils Absolute: 3.4 10*3/uL (ref 1.4–7.0)
Neutrophils: 56 %
Platelets: 354 10*3/uL (ref 150–450)
RBC: 4.41 x10E6/uL (ref 3.77–5.28)
RDW: 13.5 % (ref 11.7–15.4)
WBC: 6.1 10*3/uL (ref 3.4–10.8)

## 2021-03-03 NOTE — Progress Notes (Signed)
Her LDL, or bad cholesterol, was elevated. Cut out fried and fatty foods. If it is still elevated with the next set of labs, we can discuss medication-related options to treat cholesterol.

## 2021-04-20 ENCOUNTER — Other Ambulatory Visit: Payer: Self-pay

## 2021-04-20 ENCOUNTER — Telehealth: Payer: Self-pay

## 2021-04-20 DIAGNOSIS — I10 Essential (primary) hypertension: Secondary | ICD-10-CM

## 2021-04-20 MED ORDER — AMLODIPINE BESYLATE 5 MG PO TABS: 5.0000 mg | ORAL_TABLET | Freq: Every day | ORAL | 1 refills | Status: DC

## 2021-04-20 NOTE — Telephone Encounter (Signed)
Patient called need med refill AmLODipine (NORVASC) 5 MG tablet   Pharmacy: [facility]

## 2021-04-20 NOTE — Telephone Encounter (Signed)
Rx refilled.

## 2021-04-24 ENCOUNTER — Other Ambulatory Visit: Payer: Self-pay | Admitting: Obstetrics & Gynecology

## 2021-04-26 ENCOUNTER — Ambulatory Visit: Payer: Medicaid Other

## 2021-04-27 ENCOUNTER — Ambulatory Visit: Payer: Medicaid Other

## 2021-05-02 ENCOUNTER — Other Ambulatory Visit: Payer: Self-pay

## 2021-05-02 ENCOUNTER — Ambulatory Visit (INDEPENDENT_AMBULATORY_CARE_PROVIDER_SITE_OTHER): Payer: Medicaid Other

## 2021-05-02 DIAGNOSIS — Z3042 Encounter for surveillance of injectable contraceptive: Secondary | ICD-10-CM

## 2021-05-02 MED ORDER — MEDROXYPROGESTERONE ACETATE 150 MG/ML IM SUSY: PREFILLED_SYRINGE | Freq: Once | INTRAMUSCULAR | Status: AC

## 2021-05-02 NOTE — Progress Notes (Signed)
   NURSE VISIT- INJECTION  SUBJECTIVE:  Jaclyn Howe is a 35 y.o. G53P1001 female here for a Depo Provera for contraception/period management. She is a GYN patient.   OBJECTIVE:  There were no vitals taken for this visit.  Appears well, in no apparent distress  Injection administered in: Right upper quad. gluteus  Meds ordered this encounter  Medications  . medroxyPROGESTERone Acetate SUSY    ASSESSMENT: GYN patient Depo Provera for contraception/period management PLAN: Follow-up: in 11-13 weeks for next Depo   Israel Werts A Hoke Baer  05/02/2021 9:57 AM

## 2021-07-18 ENCOUNTER — Other Ambulatory Visit: Payer: Self-pay

## 2021-07-18 ENCOUNTER — Ambulatory Visit: Payer: Medicaid Other

## 2021-07-18 ENCOUNTER — Ambulatory Visit (INDEPENDENT_AMBULATORY_CARE_PROVIDER_SITE_OTHER): Payer: Medicaid Other | Admitting: *Deleted

## 2021-07-18 DIAGNOSIS — Z3042 Encounter for surveillance of injectable contraceptive: Secondary | ICD-10-CM | POA: Diagnosis not present

## 2021-07-18 MED ORDER — MEDROXYPROGESTERONE ACETATE 150 MG/ML IM SUSP
150.0000 mg | Freq: Once | INTRAMUSCULAR | Status: AC
  Administered 2021-07-18: 150 mg via INTRAMUSCULAR

## 2021-07-18 NOTE — Progress Notes (Signed)
   NURSE VISIT- INJECTION  SUBJECTIVE:  Jaclyn Howe is a 35 y.o. G84P1001 female here for a Depo Provera for contraception/period management. She is a GYN patient.   OBJECTIVE:  There were no vitals taken for this visit.  Appears well, in no apparent distress  Injection administered in: Left upper quad. glut  Meds ordered this encounter  Medications   medroxyPROGESTERone (DEPO-PROVERA) injection 150 mg    ASSESSMENT: GYN patient Depo Provera for contraception/period management PLAN: Follow-up: in 11-13 weeks for next Depo   Jobe Marker  07/18/2021 1:39 PM

## 2021-08-04 ENCOUNTER — Telehealth: Payer: Self-pay

## 2021-08-04 NOTE — Telephone Encounter (Signed)
Patient called asking what vitamins can she take with her blood pressure medicine. Call back # [phone].

## 2021-08-04 NOTE — Telephone Encounter (Signed)
Pt asked if she could take Women's One A Day multivitamin, informed her this was ok.

## 2021-10-08 ENCOUNTER — Encounter (HOSPITAL_COMMUNITY): Payer: Self-pay | Admitting: *Deleted

## 2021-10-08 ENCOUNTER — Emergency Department (HOSPITAL_COMMUNITY)
Admission: EM | Admit: 2021-10-08 | Discharge: 2021-10-08 | Disposition: A | Discharge status: Home / Self Care | Payer: Medicaid Other | Attending: Emergency Medicine | Admitting: Emergency Medicine

## 2021-10-08 ENCOUNTER — Other Ambulatory Visit: Payer: Self-pay

## 2021-10-08 DIAGNOSIS — I10 Essential (primary) hypertension: Secondary | ICD-10-CM | POA: Diagnosis not present

## 2021-10-08 DIAGNOSIS — F17210 Nicotine dependence, cigarettes, uncomplicated: Secondary | ICD-10-CM | POA: Diagnosis not present

## 2021-10-08 DIAGNOSIS — R109 Unspecified abdominal pain: Secondary | ICD-10-CM | POA: Diagnosis present

## 2021-10-08 DIAGNOSIS — N390 Urinary tract infection, site not specified: Secondary | ICD-10-CM | POA: Insufficient documentation

## 2021-10-08 DIAGNOSIS — Z79899 Other long term (current) drug therapy: Secondary | ICD-10-CM | POA: Insufficient documentation

## 2021-10-08 HISTORY — DX: Essential (primary) hypertension: I10

## 2021-10-08 LAB — CBC
HCT: 43.7 % (ref 36.0–46.0)
Hemoglobin: 14.8 g/dL (ref 12.0–15.0)
MCH: 33.1 pg (ref 26.0–34.0)
MCHC: 33.9 g/dL (ref 30.0–36.0)
MCV: 97.8 fL (ref 80.0–100.0)
Platelets: 294 10*3/uL (ref 150–400)
RBC: 4.47 MIL/uL (ref 3.87–5.11)
RDW: 13.7 % (ref 11.5–15.5)
WBC: 12.1 10*3/uL — ABNORMAL HIGH (ref 4.0–10.5)
nRBC: 0 % (ref 0.0–0.2)

## 2021-10-08 LAB — URINALYSIS, ROUTINE W REFLEX MICROSCOPIC
Bilirubin Urine: NEGATIVE
Glucose, UA: NEGATIVE mg/dL
Ketones, ur: NEGATIVE mg/dL
Nitrite: NEGATIVE
Protein, ur: 100 mg/dL — AB
RBC / HPF: >50 RBC/hpf — ABNORMAL HIGH (ref 0–5)
Specific Gravity, Urine: 1.015 (ref 1.005–1.030)
WBC, UA: >50 WBC/hpf — ABNORMAL HIGH (ref 0–5)
pH: 5 (ref 5.0–8.0)

## 2021-10-08 LAB — BASIC METABOLIC PANEL
Anion gap: 9 (ref 5–15)
BUN: 7 mg/dL (ref 6–20)
CO2: 24 mmol/L (ref 22–32)
Calcium: 9 mg/dL (ref 8.9–10.3)
Chloride: 105 mmol/L (ref 98–111)
Creatinine, Ser: 0.58 mg/dL (ref 0.44–1.00)
GFR, Estimated: >60 mL/min (ref >60)
Glucose, Bld: 100 mg/dL — ABNORMAL HIGH (ref 70–99)
Potassium: 3.6 mmol/L (ref 3.5–5.1)
Sodium: 138 mmol/L (ref 135–145)

## 2021-10-08 LAB — POC URINE PREG, ED: Preg Test, Ur: NEGATIVE

## 2021-10-08 MED ORDER — ONDANSETRON 4 MG PO TBDP
4.0000 mg | ORAL_TABLET | Freq: Once | ORAL | Status: AC
  Administered 2021-10-08: 4 mg via ORAL
  Filled 2021-10-08: qty 1

## 2021-10-08 MED ORDER — ONDANSETRON 4 MG PO TBDP: 4.0000 mg | ORAL_TABLET | Freq: Three times a day (TID) | ORAL | 0 refills | Status: DC | PRN

## 2021-10-08 MED ORDER — CEFTRIAXONE SODIUM 1 G IJ SOLR
1.0000 g | Freq: Once | INTRAMUSCULAR | Status: AC
  Administered 2021-10-08: 1 g via INTRAMUSCULAR
  Filled 2021-10-08: qty 10

## 2021-10-08 MED ORDER — LIDOCAINE HCL (PF) 1 % IJ SOLN
INTRAMUSCULAR | Status: AC
  Filled 2021-10-08: qty 30

## 2021-10-08 MED ORDER — CEPHALEXIN 500 MG PO CAPS: 500.0000 mg | ORAL_CAPSULE | Freq: Four times a day (QID) | ORAL | 0 refills | Status: DC

## 2021-10-08 NOTE — ED Triage Notes (Signed)
Right flank pain onset this am with nausea

## 2021-10-08 NOTE — ED Provider Notes (Signed)
[facility] EMERGENCY DEPARTMENT Provider Note   CSN: [id number] Arrival date & time: 10/08/21  2094     History Chief Complaint  Patient presents with   Flank Pain    Jaclyn Howe is a 35 y.o. female.  Pt complains of back pain and feeling like she has a urinary discomfort    The history is provided by the patient. No language interpreter was used.  Flank Pain This is a new problem. The current episode started yesterday. The problem occurs constantly. Nothing aggravates the symptoms. Nothing relieves the symptoms. She has tried nothing for the symptoms. The treatment provided no relief.      Past Medical History:  Diagnosis Date   Allergic rhinitis 06/22/2016   Chronic abdominal pain    Chronic headache    Epigastric pain 07/07/2018   Hypertension    Migraine headache 07/13/2008   Qualifier: Diagnosis of  By: Lind Guest     Nausea and vomiting    Recurrent    Patient Active Problem List   Diagnosis Date Noted   Annual physical exam 02/03/2021   Dry eyes 09/30/2020   Essential hypertension 07/22/2020   Excessive sweating 07/22/2020   NICOTINE ADDICTION 07/13/2008    Past Surgical History:  Procedure Laterality Date   ELECTIVE ABORTION       OB History     Gravida  1   Para  1   Term  1   Preterm      AB      Living  1      SAB      IAB      Ectopic      Multiple      Live Births              No family history on file.  Social History   Tobacco Use   Smoking status: Every Day    Packs/day: 1.00    Years: 3.00    Pack years: 3.00    Types: Cigarettes    Start date: 09/21/2001   Smokeless tobacco: Never  Substance Use Topics   Alcohol use: Yes    Comment: 32 ounces beer and half pint liquor   Drug use: Not Currently    Types: Marijuana    Home Medications Prior to Admission medications   Medication Sig Start Date End Date Taking? Authorizing Provider  cephALEXin (KEFLEX) 500 MG capsule Take 1 capsule (500 mg  total) by mouth 4 (four) times daily for 10 days. 10/08/21 10/18/21 Yes Cheron Schaumann K, PA-C  ondansetron (ZOFRAN ODT) 4 MG disintegrating tablet Take 1 tablet (4 mg total) by mouth every 8 (eight) hours as needed for nausea or vomiting. 10/08/21  Yes Cheron Schaumann K, PA-C  amLODipine (NORVASC) 5 MG tablet Take 1 tablet (5 mg total) by mouth daily. 04/20/21   Heather Roberts, NP  medroxyPROGESTERone Acetate 150 MG/ML SUSY INJECT 1 ML INTRAMUSCULARLY ONCE EVERY 3 MONTHS. 04/24/21   Lazaro Arms, MD    Allergies    Patient has no known allergies.  Review of Systems   Review of Systems  Genitourinary:  Positive for flank pain.  All other systems reviewed and are negative.  Physical Exam Updated Vital Signs BP 136/86   Pulse (!) 104   Temp 98.7 F (37.1 C) (Oral)   Resp 18   Ht 5\' 1"  (1.549 m)   Wt 49.8 kg   SpO2 99%   BMI 20.74 kg/m   Physical Exam Vitals  and nursing note reviewed.  Constitutional:      Appearance: She is well-developed.  HENT:     Head: Normocephalic.     Mouth/Throat:     Mouth: Mucous membranes are moist.  Eyes:     Pupils: Pupils are equal, round, and reactive to light.  Cardiovascular:     Rate and Rhythm: Normal rate and regular rhythm.     Pulses: Normal pulses.  Pulmonary:     Effort: Pulmonary effort is normal.  Abdominal:     General: Abdomen is flat. There is no distension.  Musculoskeletal:        General: Normal range of motion.     Cervical back: Normal range of motion.  Skin:    General: Skin is warm.  Neurological:     General: No focal deficit present.     Mental Status: She is alert and oriented to person, place, and time.  Psychiatric:        Mood and Affect: Mood normal.    ED Results / Procedures / Treatments   Labs (all labs ordered are listed, but only abnormal results are displayed) Labs Reviewed  URINALYSIS, ROUTINE W REFLEX MICROSCOPIC - Abnormal; Notable for the following components:      Result Value   APPearance  CLOUDY (*)    Hgb urine dipstick LARGE (*)    Protein, ur 100 (*)    Leukocytes,Ua LARGE (*)    RBC / HPF >50 (*)    WBC, UA >50 (*)    Bacteria, UA RARE (*)    Non Squamous Epithelial 0-5 (*)    All other components within normal limits  BASIC METABOLIC PANEL - Abnormal; Notable for the following components:   Glucose, Bld 100 (*)    All other components within normal limits  CBC - Abnormal; Notable for the following components:   WBC 12.1 (*)    All other components within normal limits  POC URINE PREG, ED    EKG None  Radiology No results found.  Procedures Procedures   Medications Ordered in ED Medications  cefTRIAXone (ROCEPHIN) injection 1 g (1 g Intramuscular Given 10/08/21 1148)  ondansetron (ZOFRAN-ODT) disintegrating tablet 4 mg (4 mg Oral Given 10/08/21 1148)  lidocaine (PF) (XYLOCAINE) 1 % injection (  Given 10/08/21 1150)    ED Course  I have reviewed the triage vital signs and the nursing notes.  Pertinent labs & imaging results that were available during my care of the patient were reviewed by me and considered in my medical decision making (see chart for details).    MDM Rules/Calculators/A&P                           MDM: Ua shows greater than 50 wbcs and 50 rbc.  Pt given Rocephin IM.  Rx for keflex  Final Clinical Impression(s) / ED Diagnoses Final diagnoses:  Urinary tract infection without hematuria, site unspecified    Rx / DC Orders ED Discharge Orders          Ordered    cephALEXin (KEFLEX) 500 MG capsule  4 times daily        10/08/21 1134    ondansetron (ZOFRAN ODT) 4 MG disintegrating tablet  Every 8 hours PRN        10/08/21 1134          An After Visit Summary was printed and given to the patient.    Elson Areas, [place]  10/08/21 1828    Vanetta Mulders, MD 10/13/21 2333

## 2021-10-09 ENCOUNTER — Telehealth: Payer: Self-pay

## 2021-10-09 NOTE — Telephone Encounter (Signed)
Transition Care Management Follow-up Telephone Call Date of discharge and from where: 10/08/2021 from [facility] How have you been since you were released from the hospital? Pt stated that she is feeling much better.  Any questions or concerns? No  Items Reviewed: Did the pt receive and understand the discharge instructions provided? Yes  Medications obtained and verified? Yes  Other? No  Any new allergies since your discharge? No  Dietary orders reviewed? No Do you have support at home? Yes   Functional Questionnaire: (I = Independent and D = Dependent) ADLs: I  Bathing/Dressing- I  Meal Prep- I  Eating- I  Maintaining continence- I  Transferring/Ambulation- I  Managing Meds- I   Follow up appointments reviewed:  PCP Hospital f/u appt confirmed? No  Specialist Hospital f/u appt confirmed? No   Are transportation arrangements needed? No  If their condition worsens, is the pt aware to call PCP or go to the Emergency Dept.? Yes Was the patient provided with contact information for the PCP's office or ED? Yes Was to pt encouraged to call back with questions or concerns? Yes

## 2021-10-16 ENCOUNTER — Other Ambulatory Visit: Payer: Self-pay

## 2021-10-16 ENCOUNTER — Ambulatory Visit (INDEPENDENT_AMBULATORY_CARE_PROVIDER_SITE_OTHER): Payer: Medicaid Other | Admitting: Adult Health

## 2021-10-16 ENCOUNTER — Encounter: Payer: Self-pay | Admitting: Adult Health

## 2021-10-16 VITALS — BP 113/71 | HR 84 | Ht 61.0 in | Wt 112.0 lb

## 2021-10-16 DIAGNOSIS — Z3042 Encounter for surveillance of injectable contraceptive: Secondary | ICD-10-CM | POA: Diagnosis not present

## 2021-10-16 DIAGNOSIS — Z01419 Encounter for gynecological examination (general) (routine) without abnormal findings: Secondary | ICD-10-CM | POA: Diagnosis not present

## 2021-10-16 MED ORDER — MEDROXYPROGESTERONE ACETATE 150 MG/ML IM SUSP
150.0000 mg | Freq: Once | INTRAMUSCULAR | Status: AC
  Administered 2021-10-16: 150 mg via INTRAMUSCULAR

## 2021-10-16 MED ORDER — MEDROXYPROGESTERONE ACETATE 150 MG/ML IM SUSY: PREFILLED_SYRINGE | INTRAMUSCULAR | 4 refills | Status: DC

## 2021-10-16 NOTE — Progress Notes (Signed)
Depo Provera 150 mg given IM in right upper outer quad gluteus. Patient tolerated well. Next dose in 12 weeks.

## 2021-10-16 NOTE — Progress Notes (Signed)
Patient ID: Jaclyn Howe, female   DOB: 1986-01-19, 35 y.o.   MRN: [id number] History of Present Illness: Kataleia is a 35 year old black female,single, G1P1001 in for a well woman gyn exam and to get depo. PCP is Laury Axon NP  Lab Results  Component Value Date   DIAGPAP  10/13/2019    - Negative for intraepithelial lesion or malignancy (NILM)   HPVHIGH Negative 10/13/2019    Current Medications, Allergies, Past Medical History, Past Surgical History, Family History and Social History were reviewed in [employer] record.     Review of Systems: Patient denies any headaches, hearing loss, fatigue, blurred vision, shortness of breath, chest pain, abdominal pain, problems with bowel movements, urination, or intercourse(not having sex). No joint pain or mood swings.     Physical Exam:BP 113/71 (BP Location: Left Arm, Patient Position: Sitting, Cuff Size: Normal)   Pulse 84   Ht 5\' 1"  (1.549 m)   Wt 112 lb (50.8 kg)   BMI 21.16 kg/m   General:  Well developed, well nourished, no acute distress Skin:  Warm and dry Neck:  Midline trachea, normal thyroid, good ROM, no lymphadenopathy Lungs; Clear to auscultation bilaterally Breast:  No dominant palpable mass, retraction, or nipple discharge Cardiovascular: Regular rate and rhythm Abdomen:  Soft, non tender, no hepatosplenomegaly Pelvic:  External genitalia is normal in appearance, no lesions.  The vagina is normal in appearance. Urethra has no lesions or masses. The cervix is smooth.  Uterus is felt to be normal size, shape, and contour.  No adnexal masses or tenderness noted.Bladder is non tender, no masses felt. Extremities/musculoskeletal:  No swelling or varicosities noted, no clubbing or cyanosis Psych:  No mood changes, alert and cooperative,seems happy AA is 0  Fall risk is low Depression screen [facility] 2/9 10/16/2021 02/03/2021 09/30/2020  Decreased Interest 0 0 0  Down, Depressed, Hopeless 0 0 0  PHQ - 2  Score 0 0 0  Altered sleeping 0 - -  Tired, decreased energy 0 - -  Change in appetite 0 - -  Feeling bad or failure about yourself  0 - -  Trouble concentrating 0 - -  Moving slowly or fidgety/restless 0 - -  Suicidal thoughts 0 - -  PHQ-9 Score 0 - -  Difficult doing work/chores - - -    GAD 7 : Generalized Anxiety Score 10/16/2021  Nervous, Anxious, on Edge 0  Control/stop worrying 0  Worry too much - different things 0  Trouble relaxing 0  Restless 0  Easily annoyed or irritable 0  Afraid - awful might happen 0  Total GAD 7 Score 0    Examination chaperoned by 10/18/2021 LPN   Impression and Plan: 1. Encounter for well woman exam with routine gynecological exam Pap and physical in 1 year   2. Encounter for surveillance of injectable contraceptive Will refill depo Meds ordered this encounter  Medications   medroxyPROGESTERone (DEPO-PROVERA) injection 150 mg   medroxyPROGESTERone Acetate 150 MG/ML SUSY    Sig: INJECT 1 ML INTRAMUSCULARLY ONCE EVERY 3 MONTHS.    Dispense:  1 mL    Refill:  4    Order Specific Question:   Supervising Provider    Answer:   Faith Rogue H [[id number]]

## 2021-10-18 ENCOUNTER — Other Ambulatory Visit: Payer: Self-pay

## 2021-10-18 ENCOUNTER — Telehealth: Payer: Self-pay

## 2021-10-18 DIAGNOSIS — I10 Essential (primary) hypertension: Secondary | ICD-10-CM

## 2021-10-18 MED ORDER — AMLODIPINE BESYLATE 5 MG PO TABS
5.0000 mg | ORAL_TABLET | Freq: Every day | ORAL | 0 refills | Status: DC
Start: 2021-10-18 — End: 2021-11-08

## 2021-10-18 NOTE — Telephone Encounter (Signed)
Patient called need med refill  amLODipine (NORVASC) 5 MG tablet   Pharmacy: [facility]

## 2021-10-18 NOTE — Telephone Encounter (Signed)
Spoke with pt about refills and made appt

## 2021-11-07 ENCOUNTER — Ambulatory Visit: Payer: Medicaid Other | Admitting: Nurse Practitioner

## 2021-11-08 ENCOUNTER — Encounter: Payer: Self-pay | Admitting: Nurse Practitioner

## 2021-11-08 ENCOUNTER — Ambulatory Visit: Payer: Medicaid Other | Admitting: Nurse Practitioner

## 2021-11-08 ENCOUNTER — Other Ambulatory Visit: Payer: Self-pay | Admitting: Nurse Practitioner

## 2021-11-08 ENCOUNTER — Other Ambulatory Visit: Payer: Self-pay

## 2021-11-08 VITALS — BP 136/78 | HR 77 | Ht 61.0 in | Wt 113.0 lb

## 2021-11-08 DIAGNOSIS — I10 Essential (primary) hypertension: Secondary | ICD-10-CM

## 2021-11-08 DIAGNOSIS — M25562 Pain in left knee: Secondary | ICD-10-CM | POA: Diagnosis not present

## 2021-11-08 DIAGNOSIS — G8929 Other chronic pain: Secondary | ICD-10-CM | POA: Diagnosis not present

## 2021-11-08 DIAGNOSIS — M79605 Pain in left leg: Secondary | ICD-10-CM | POA: Insufficient documentation

## 2021-11-08 DIAGNOSIS — Z23 Encounter for immunization: Secondary | ICD-10-CM | POA: Diagnosis not present

## 2021-11-08 MED ORDER — IBUPROFEN 600 MG PO TABS: 600.0000 mg | ORAL_TABLET | Freq: Three times a day (TID) | ORAL | 0 refills | Status: DC | PRN

## 2021-11-08 MED ORDER — IBUPROFEN 600 MG PO TABS
600.0000 mg | ORAL_TABLET | Freq: Two times a day (BID) | ORAL | 0 refills | Status: DC | PRN
Start: 2021-11-08 — End: 2022-05-31

## 2021-11-08 MED ORDER — AMLODIPINE BESYLATE 5 MG PO TABS: 5.0000 mg | ORAL_TABLET | Freq: Every day | ORAL | 0 refills | Status: DC

## 2021-11-08 MED ORDER — AMLODIPINE BESYLATE 5 MG PO TABS: 5.0000 mg | ORAL_TABLET | Freq: Every day | ORAL | 3 refills | Status: DC

## 2021-11-08 NOTE — Assessment & Plan Note (Signed)
Ibuprofen 600mg  twice daily as needed ordered.

## 2021-11-08 NOTE — Progress Notes (Signed)
   Jaclyn Howe     MRN: [id number]      DOB: 1986/12/09   HPI Jaclyn Howe is here for follow up and re-evaluation of chronic medical conditions, medication management and review of any available recent lab and radiology data.  Preventive health is updated, specifically  Cancer screening and Immunization.   The PT denies any adverse reactions to current medications since the last visit.    Pt c/o of left knee pain , stiffness and swelling that started 3 months ago. She stated that her knee pain is intermittent, can be up to 10/10 when it is severe. He pain is currently rated a 4/10. Her pain is increased when walking, she has not used any medication for her knee pain. She denies fever, chills, nauseas vomiting, She had her yearly exam done at the OBGYN   ROS Denies recent fever or chills. Denies sinus pressure, nasal congestion, ear pain or sore throat. Denies chest congestion, productive cough or wheezing. Denies chest pains, palpitations and leg swelling Denies abdominal pain, nausea, vomiting,diarrhea or constipation.   Denies dysuria, frequency, hesitancy or incontinence. C/o left knee joint pain,limitation in mobility. Denies headaches, seizures, numbness, or tingling. Denies depression, anxiety or insomnia. Denies skin break down or rash.   PE  BP 136/78 (BP Location: Right Arm, Patient Position: Sitting, Cuff Size: Normal)   Pulse 77   Ht 5\' 1"  (1.549 m)   Wt 113 lb (51.3 kg)   SpO2 98%   BMI 21.35 kg/m   Patient alert and oriented and in no cardiopulmonary distress.   Chest: Clear to auscultation bilaterally.  CVS: S1, S2 no murmurs, no S3.Regular rate.  ABD: Soft non tender.   MS: Adequate ROM spine, shoulders, tenderness of left knee on palpation, reduced level of ROM of left knee due to pain.    Psych: Good eye contact, normal affect. Memory intact not anxious or depressed appearing.  CNS: CN 2-12 intact, power,  normal throughout.no focal deficits  noted.   Assessment & Plan

## 2021-11-08 NOTE — Assessment & Plan Note (Signed)
Well controled today. Continue amlodipine 5mg  daily  BP Readings from Last 3 Encounters:  11/08/21 136/78  10/16/21 113/71  10/08/21 136/86

## 2021-11-08 NOTE — Patient Instructions (Signed)
Please follow up in 6 months   Take ibuprofen 600mg   twice daily as needed for your left knee pain   It is important that you exercise regularly at least 30 minutes 5 times a week.  Think about what you will eat, plan ahead. Choose " clean, green, fresh or frozen" over canned, processed or packaged foods which are more sugary, salty and fatty. 70 to 75% of food eaten should be vegetables and fruit. Three meals at set times with snacks allowed between meals, but they must be fruit or vegetables. Aim to eat over a 12 hour period , example 7 am to 7 pm, and STOP after  your last meal of the day. Drink water,generally about 64 ounces per day, no other drink is as healthy. Fruit juice is best enjoyed in a healthy way, by EATING the fruit.  Thanks for choosing [facility], we consider it a privelige to serve you.

## 2021-12-07 ENCOUNTER — Ambulatory Visit: Payer: Medicaid Other | Admitting: Nurse Practitioner

## 2022-01-09 ENCOUNTER — Encounter: Payer: Self-pay | Admitting: Nurse Practitioner

## 2022-01-10 ENCOUNTER — Ambulatory Visit (INDEPENDENT_AMBULATORY_CARE_PROVIDER_SITE_OTHER): Payer: Medicaid Other | Admitting: *Deleted

## 2022-01-10 ENCOUNTER — Other Ambulatory Visit: Payer: Self-pay

## 2022-01-10 DIAGNOSIS — Z3042 Encounter for surveillance of injectable contraceptive: Secondary | ICD-10-CM | POA: Diagnosis not present

## 2022-01-10 MED ORDER — MEDROXYPROGESTERONE ACETATE 150 MG/ML IM SUSP
150.0000 mg | Freq: Once | INTRAMUSCULAR | Status: AC
  Administered 2022-01-10: 150 mg via INTRAMUSCULAR

## 2022-01-10 NOTE — Progress Notes (Addendum)
° °  NURSE VISIT- INJECTION  SUBJECTIVE:  Jaclyn Howe is a 36 y.o. G48P1001 female here for a Depo Provera for contraception/period management. She is a GYN patient.   OBJECTIVE:  There were no vitals taken for this visit.  Appears well, in no apparent distress  Injection administered in: Left upper quad. gluteus  Meds ordered this encounter  Medications   medroxyPROGESTERone (DEPO-PROVERA) injection 150 mg    ASSESSMENT: GYN patient Depo Provera for contraception/period management PLAN: Follow-up: in 11-13 weeks for next Depo   Alice Rieger  01/10/2022 9:40 AM

## 2022-02-05 ENCOUNTER — Encounter: Payer: Medicaid Other | Admitting: Nurse Practitioner

## 2022-02-07 ENCOUNTER — Encounter: Payer: Medicaid Other | Admitting: Nurse Practitioner

## 2022-02-20 ENCOUNTER — Other Ambulatory Visit: Payer: Self-pay | Admitting: Nurse Practitioner

## 2022-02-20 DIAGNOSIS — I10 Essential (primary) hypertension: Secondary | ICD-10-CM

## 2022-03-06 ENCOUNTER — Ambulatory Visit (INDEPENDENT_AMBULATORY_CARE_PROVIDER_SITE_OTHER): Payer: Medicaid Other | Admitting: Nurse Practitioner

## 2022-03-06 ENCOUNTER — Other Ambulatory Visit: Payer: Self-pay | Admitting: Nurse Practitioner

## 2022-03-06 ENCOUNTER — Encounter: Payer: Self-pay | Admitting: Nurse Practitioner

## 2022-03-06 ENCOUNTER — Other Ambulatory Visit: Payer: Self-pay

## 2022-03-06 VITALS — BP 127/86 | HR 83 | Ht 61.0 in | Wt 113.0 lb

## 2022-03-06 DIAGNOSIS — Z139 Encounter for screening, unspecified: Secondary | ICD-10-CM

## 2022-03-06 DIAGNOSIS — R8290 Unspecified abnormal findings in urine: Secondary | ICD-10-CM | POA: Diagnosis not present

## 2022-03-06 DIAGNOSIS — R809 Proteinuria, unspecified: Secondary | ICD-10-CM

## 2022-03-06 DIAGNOSIS — I10 Essential (primary) hypertension: Secondary | ICD-10-CM

## 2022-03-06 LAB — POCT URINALYSIS DIP (CLINITEK)
Bilirubin, UA: NEGATIVE
Blood, UA: NEGATIVE
Glucose, UA: NEGATIVE mg/dL
Ketones, POC UA: NEGATIVE mg/dL
Leukocytes, UA: NEGATIVE
Nitrite, UA: NEGATIVE
POC PROTEIN,UA: 30 — AB
Spec Grav, UA: >=1.03 — AB (ref 1.010–1.025)
Urobilinogen, UA: 0.2 E.U./dL
pH, UA: 7 (ref 5.0–8.0)

## 2022-03-06 MED ORDER — AMLODIPINE BESYLATE 5 MG PO TABS: 5.0000 mg | ORAL_TABLET | Freq: Every day | ORAL | 3 refills | Status: DC

## 2022-03-06 NOTE — Progress Notes (Signed)
Patient does have protein in her urine today, patient noted to have protein in her previous urinalysis, upon review of her previous urinalysis   I have referred patient to urology.  Thank you

## 2022-03-06 NOTE — Assessment & Plan Note (Signed)
BP Readings from Last 3 Encounters:  ?03/06/22 127/86  ?11/08/21 136/78  ?10/16/21 113/71  ?takes amlodipine 47m daily, continue current medications. ?CMP+EGFR today  ?

## 2022-03-06 NOTE — Patient Instructions (Addendum)
?  Pleas get your covid booster at your pharmacy. ? ?It is important that you exercise regularly at least 30 minutes 5 times a week.  ?Think about what you will eat, plan ahead. ?Choose " clean, green, fresh or frozen" over canned, processed or packaged foods which are more sugary, salty and fatty. ?70 to 75% of food eaten should be vegetables and fruit. ?Three meals at set times with snacks allowed between meals, but they must be fruit or vegetables. ?Aim to eat over a 12 hour period , example 7 am to 7 pm, and STOP after  your last meal of the day. ?Drink water,generally about 64 ounces per day, no other drink is as healthy. Fruit juice is best enjoyed in a healthy way, by EATING the fruit. ? ?Thanks for choosing [facility], we consider it a privelige to serve you.  ?

## 2022-03-06 NOTE — Assessment & Plan Note (Addendum)
Protein noted in urinalysis today ?Patient previously tested positive for protein in the urine tests done some4 months ago.  No prior history of kidney disease ?CMP plus EGFR today ?Patient referred to urology. ?

## 2022-03-06 NOTE — Assessment & Plan Note (Addendum)
POCT urinalysis dip done today. ?Urine negative for leukocytes, nitrate. ?Tested positive for protein. ?Upon review of previous urinalyses, patient had protein noted in her previous urinalysis done 4 months ago. ?Patient told to drink at least 64 ounces of water daily. ?Refer patient to urology due to persistent proteinuria ?

## 2022-03-06 NOTE — Progress Notes (Signed)
? ?  Jaclyn Howe     MRN: [id number]      DOB: [birth year] ? ? ?HPI ?Jaclyn Howe medical history of hypertension, is here for complaints of urinary odour since about a week. Pt denies frequency, dysuria, abdominal pain, fever, chills. States that she had this symptoms a long time ago.  Was treated for UTI some months ago. ? ? ? ? ?ROS ?Denies recent fever or chills. ?Denies sinus pressure, nasal congestion, ear pain or sore throat. ?Denies chest congestion, productive cough or wheezing. ?Denies chest pains, palpitations and leg swelling ?Denies abdominal pain, nausea, vomiting,diarrhea or constipation.   ?Denies dysuria, frequency, hesitancy or incontinence. ?Denies joint pain, swelling and limitation in mobility. ?Denies headaches, seizures, numbness, or tingling. ?Denies depression, anxiety or insomnia. ?Denies skin break down or rash. ? ? ?PE ? ?BP 127/86 (BP Location: Right Arm, Patient Position: Sitting, Cuff Size: Normal)   Pulse 83   Ht $R'5\' 1"'Ft$  (1.549 m)   Wt 113 lb (51.3 kg)   SpO2 99%   BMI 21.35 kg/m?  ? ?Patient alert and oriented and in no cardiopulmonary distress. ? ?Chest: Clear to auscultation bilaterally. ? ?CVS: S1, S2 no murmurs, no S3.Regular rate. ? ?ABD: Soft non tender.  ? ?Ext: No edema ? ?MS: Adequate ROM spine, shoulders, hips and knees. ? ?Skin: Intact, no ulcerations or rash noted. ? ?Psych: Good eye contact, normal affect. Memory intact not anxious or depressed appearing. ? ? ? ?Assessment & Plan ?Essential hypertension ?BP Readings from Last 3 Encounters:  ?03/06/22 127/86  ?11/08/21 136/78  ?10/16/21 113/71  ?takes amlodipine $RemoveBeforeDEI'5mg'LgDtEMspNZZNkPmT$  daily, continue current medications. ?CMP+EGFR today  ? ?Foul smelling urine ?POCT urinalysis dip done today. ?Urine negative for leukocytes, nitrate. ?Tested positive for protein. ?Upon review of previous urinalyses, patient had protein noted in her previous urinalysis done 4 months ago. ?Patient told to drink at least 64 ounces of water daily. ?Refer  patient to urology due to persistent proteinuria ? ?Proteinuria ?Protein noted in urinalysis today ?Patient previously tested positive for protein in the urine tests done some4 months ago.  No prior history of kidney disease ?CMP plus EGFR today ?Patient referred to urology.  ? ?

## 2022-03-07 LAB — CMP14+EGFR
ALT: 15 IU/L (ref 0–32)
AST: 18 IU/L (ref 0–40)
Albumin/Globulin Ratio: 1.9 (ref 1.2–2.2)
Albumin: 4.7 g/dL (ref 3.8–4.8)
Alkaline Phosphatase: 101 IU/L (ref 44–121)
BUN/Creatinine Ratio: 8 — ABNORMAL LOW (ref 9–23)
BUN: 6 mg/dL (ref 6–20)
Bilirubin Total: 0.6 mg/dL (ref 0.0–1.2)
CO2: 22 mmol/L (ref 20–29)
Calcium: 9.6 mg/dL (ref 8.7–10.2)
Chloride: 105 mmol/L (ref 96–106)
Creatinine, Ser: 0.74 mg/dL (ref 0.57–1.00)
Globulin, Total: 2.5 g/dL (ref 1.5–4.5)
Glucose: 83 mg/dL (ref 70–99)
Potassium: 4.7 mmol/L (ref 3.5–5.2)
Sodium: 140 mmol/L (ref 134–144)
Total Protein: 7.2 g/dL (ref 6.0–8.5)
eGFR: 107 mL/min/{1.73_m2} (ref >59)

## 2022-03-14 ENCOUNTER — Telehealth: Payer: Self-pay

## 2022-03-14 ENCOUNTER — Other Ambulatory Visit: Payer: Self-pay

## 2022-03-14 DIAGNOSIS — R809 Proteinuria, unspecified: Secondary | ICD-10-CM

## 2022-03-14 NOTE — Telephone Encounter (Signed)
Per Dr. Felipa Eth patient diagnosis of proteinuria- patient to see nephrology. ? ?Called and discussed with referral dept from PCP office. PCP will refer to nephrology.  ?

## 2022-03-16 ENCOUNTER — Ambulatory Visit: Payer: Medicaid Other | Admitting: Urology

## 2022-03-19 ENCOUNTER — Encounter: Payer: Medicaid Other | Admitting: Nurse Practitioner

## 2022-04-04 ENCOUNTER — Ambulatory Visit (INDEPENDENT_AMBULATORY_CARE_PROVIDER_SITE_OTHER): Payer: Medicaid Other | Admitting: *Deleted

## 2022-04-04 DIAGNOSIS — Z3042 Encounter for surveillance of injectable contraceptive: Secondary | ICD-10-CM | POA: Diagnosis not present

## 2022-04-04 MED ORDER — MEDROXYPROGESTERONE ACETATE 150 MG/ML IM SUSP
150.0000 mg | Freq: Once | INTRAMUSCULAR | Status: AC
  Administered 2022-04-04: 150 mg via INTRAMUSCULAR

## 2022-04-04 NOTE — Progress Notes (Signed)
? ?  NURSE VISIT- INJECTION ? ?SUBJECTIVE:  ?Jaclyn Howe is a 36 y.o. G72P1001 female here for a Depo Provera for contraception/period management. She is a GYN patient.  ? ?OBJECTIVE:  ?There were no vitals taken for this visit.  ?Appears well, in no apparent distress ? ?Injection administered in: Right upper quad. gluteus ? ?Meds ordered this encounter  ?Medications  ? medroxyPROGESTERone (DEPO-PROVERA) injection 150 mg  ? ? ?ASSESSMENT: ?GYN patient Depo Provera for contraception/period management ?PLAN: ?Follow-up: in 11-13 weeks for next Depo  ? ?Annamarie Dawley  ?04/04/2022 ?9:50 AM  ?

## 2022-04-06 ENCOUNTER — Encounter: Payer: Medicaid Other | Admitting: Nurse Practitioner

## 2022-04-18 ENCOUNTER — Encounter: Payer: Medicaid Other | Admitting: Nurse Practitioner

## 2022-04-18 DIAGNOSIS — R319 Hematuria, unspecified: Secondary | ICD-10-CM | POA: Diagnosis not present

## 2022-04-18 DIAGNOSIS — I129 Hypertensive chronic kidney disease with stage 1 through stage 4 chronic kidney disease, or unspecified chronic kidney disease: Secondary | ICD-10-CM | POA: Diagnosis not present

## 2022-04-18 DIAGNOSIS — N181 Chronic kidney disease, stage 1: Secondary | ICD-10-CM | POA: Diagnosis not present

## 2022-04-18 DIAGNOSIS — R809 Proteinuria, unspecified: Secondary | ICD-10-CM | POA: Diagnosis not present

## 2022-04-18 DIAGNOSIS — Z716 Tobacco abuse counseling: Secondary | ICD-10-CM | POA: Diagnosis not present

## 2022-04-18 DIAGNOSIS — E559 Vitamin D deficiency, unspecified: Secondary | ICD-10-CM | POA: Diagnosis not present

## 2022-04-30 ENCOUNTER — Other Ambulatory Visit (HOSPITAL_COMMUNITY): Payer: Self-pay | Admitting: Nephrology

## 2022-04-30 ENCOUNTER — Other Ambulatory Visit: Payer: Self-pay | Admitting: Nephrology

## 2022-04-30 DIAGNOSIS — R319 Hematuria, unspecified: Secondary | ICD-10-CM

## 2022-04-30 DIAGNOSIS — I129 Hypertensive chronic kidney disease with stage 1 through stage 4 chronic kidney disease, or unspecified chronic kidney disease: Secondary | ICD-10-CM

## 2022-04-30 DIAGNOSIS — N181 Chronic kidney disease, stage 1: Secondary | ICD-10-CM

## 2022-04-30 DIAGNOSIS — R809 Proteinuria, unspecified: Secondary | ICD-10-CM

## 2022-04-30 DIAGNOSIS — E559 Vitamin D deficiency, unspecified: Secondary | ICD-10-CM

## 2022-05-01 DIAGNOSIS — N181 Chronic kidney disease, stage 1: Secondary | ICD-10-CM | POA: Diagnosis not present

## 2022-05-01 DIAGNOSIS — E559 Vitamin D deficiency, unspecified: Secondary | ICD-10-CM | POA: Diagnosis not present

## 2022-05-01 DIAGNOSIS — R809 Proteinuria, unspecified: Secondary | ICD-10-CM | POA: Diagnosis not present

## 2022-05-01 DIAGNOSIS — R319 Hematuria, unspecified: Secondary | ICD-10-CM | POA: Diagnosis not present

## 2022-05-01 DIAGNOSIS — Z716 Tobacco abuse counseling: Secondary | ICD-10-CM | POA: Diagnosis not present

## 2022-05-01 DIAGNOSIS — I129 Hypertensive chronic kidney disease with stage 1 through stage 4 chronic kidney disease, or unspecified chronic kidney disease: Secondary | ICD-10-CM | POA: Diagnosis not present

## 2022-05-08 ENCOUNTER — Ambulatory Visit: Payer: Medicaid Other | Admitting: Nurse Practitioner

## 2022-05-08 DIAGNOSIS — Z139 Encounter for screening, unspecified: Secondary | ICD-10-CM | POA: Diagnosis not present

## 2022-05-09 ENCOUNTER — Other Ambulatory Visit: Payer: Self-pay | Admitting: Nurse Practitioner

## 2022-05-09 ENCOUNTER — Telehealth: Payer: Self-pay | Admitting: Nurse Practitioner

## 2022-05-09 DIAGNOSIS — E559 Vitamin D deficiency, unspecified: Secondary | ICD-10-CM

## 2022-05-09 LAB — CBC WITH DIFFERENTIAL/PLATELET
Basophils Absolute: 0.1 10*3/uL (ref 0.0–0.2)
Basos: 1 %
EOS (ABSOLUTE): 0.1 10*3/uL (ref 0.0–0.4)
Eos: 1 %
Hematocrit: 43.6 % (ref 34.0–46.6)
Hemoglobin: 14.7 g/dL (ref 11.1–15.9)
Immature Grans (Abs): 0 10*3/uL (ref 0.0–0.1)
Immature Granulocytes: 0 %
Lymphocytes Absolute: 2.1 10*3/uL (ref 0.7–3.1)
Lymphs: 37 %
MCH: 32.5 pg (ref 26.6–33.0)
MCHC: 33.7 g/dL (ref 31.5–35.7)
MCV: 97 fL (ref 79–97)
Monocytes Absolute: 0.3 10*3/uL (ref 0.1–0.9)
Monocytes: 5 %
Neutrophils Absolute: 3.2 10*3/uL (ref 1.4–7.0)
Neutrophils: 56 %
Platelets: 380 10*3/uL (ref 150–450)
RBC: 4.52 x10E6/uL (ref 3.77–5.28)
RDW: 12.6 % (ref 11.7–15.4)
WBC: 5.8 10*3/uL (ref 3.4–10.8)

## 2022-05-09 LAB — CMP14+EGFR
ALT: 21 IU/L (ref 0–32)
AST: 27 IU/L (ref 0–40)
Albumin/Globulin Ratio: 1.7 (ref 1.2–2.2)
Albumin: 4.7 g/dL (ref 3.8–4.8)
Alkaline Phosphatase: 103 IU/L (ref 44–121)
BUN/Creatinine Ratio: 12 (ref 9–23)
BUN: 9 mg/dL (ref 6–20)
Bilirubin Total: 0.4 mg/dL (ref 0.0–1.2)
CO2: 19 mmol/L — ABNORMAL LOW (ref 20–29)
Calcium: 9.5 mg/dL (ref 8.7–10.2)
Chloride: 103 mmol/L (ref 96–106)
Creatinine, Ser: 0.75 mg/dL (ref 0.57–1.00)
Globulin, Total: 2.7 g/dL (ref 1.5–4.5)
Glucose: 83 mg/dL (ref 70–99)
Potassium: 4.3 mmol/L (ref 3.5–5.2)
Sodium: 138 mmol/L (ref 134–144)
Total Protein: 7.4 g/dL (ref 6.0–8.5)
eGFR: 105 mL/min/{1.73_m2} (ref >59)

## 2022-05-09 LAB — VITAMIN D 25 HYDROXY (VIT D DEFICIENCY, FRACTURES): Vit D, 25-Hydroxy: 19.9 ng/mL — ABNORMAL LOW (ref 30.0–100.0)

## 2022-05-09 LAB — LIPID PANEL
Chol/HDL Ratio: 4.2 ratio (ref 0.0–4.4)
Cholesterol, Total: 234 mg/dL — ABNORMAL HIGH (ref 100–199)
HDL: 56 mg/dL (ref >39)
LDL Chol Calc (NIH): 123 mg/dL — ABNORMAL HIGH (ref 0–99)
Triglycerides: 315 mg/dL — ABNORMAL HIGH (ref 0–149)
VLDL Cholesterol Cal: 55 mg/dL — ABNORMAL HIGH (ref 5–40)

## 2022-05-09 LAB — HEMOGLOBIN A1C
Est. average glucose Bld gHb Est-mCnc: 108 mg/dL
Hgb A1c MFr Bld: 5.4 % (ref 4.8–5.6)

## 2022-05-09 LAB — TSH: TSH: 0.867 u[IU]/mL (ref 0.450–4.500)

## 2022-05-09 MED ORDER — VITAMIN D (ERGOCALCIFEROL) 1.25 MG (50000 UNIT) PO CAPS: 50000.0000 [IU] | ORAL_CAPSULE | ORAL | 0 refills | Status: DC

## 2022-05-09 NOTE — Progress Notes (Signed)
Has vitamin D deficiency, patient should start taking vitamin D 50,000 units once weekly ? ?Hyperlipidemia .eat a healthy diet, including lots of fruits and vegetables. ?Avoid foods with a lot of saturated and trans fats, such as red meat, butter, fried foods and cheese . ?Maintain a healthy weight. ? ?Other labs are normal.  Patient should keep upcoming appointment as planned ?

## 2022-05-09 NOTE — Telephone Encounter (Signed)
Please call patient to go over test results.  ?

## 2022-05-09 NOTE — Telephone Encounter (Signed)
Labs are not resulted yet spoke with pt advised once fola results them I will contact her pt verbalized understanding ?

## 2022-05-11 ENCOUNTER — Ambulatory Visit (HOSPITAL_COMMUNITY)
Admission: RE | Admit: 2022-05-11 | Discharge: 2022-05-11 | Disposition: A | Discharge status: Home / Self Care | Payer: Medicaid Other | Source: Ambulatory Visit | Attending: Nephrology | Admitting: Nephrology

## 2022-05-11 DIAGNOSIS — E559 Vitamin D deficiency, unspecified: Secondary | ICD-10-CM | POA: Diagnosis not present

## 2022-05-11 DIAGNOSIS — R809 Proteinuria, unspecified: Secondary | ICD-10-CM | POA: Diagnosis not present

## 2022-05-11 DIAGNOSIS — N189 Chronic kidney disease, unspecified: Secondary | ICD-10-CM | POA: Diagnosis not present

## 2022-05-11 DIAGNOSIS — I129 Hypertensive chronic kidney disease with stage 1 through stage 4 chronic kidney disease, or unspecified chronic kidney disease: Secondary | ICD-10-CM

## 2022-05-11 DIAGNOSIS — N181 Chronic kidney disease, stage 1: Secondary | ICD-10-CM

## 2022-05-11 DIAGNOSIS — R319 Hematuria, unspecified: Secondary | ICD-10-CM | POA: Insufficient documentation

## 2022-05-26 DIAGNOSIS — N181 Chronic kidney disease, stage 1: Secondary | ICD-10-CM | POA: Diagnosis not present

## 2022-05-26 DIAGNOSIS — E559 Vitamin D deficiency, unspecified: Secondary | ICD-10-CM | POA: Diagnosis not present

## 2022-05-26 DIAGNOSIS — I129 Hypertensive chronic kidney disease with stage 1 through stage 4 chronic kidney disease, or unspecified chronic kidney disease: Secondary | ICD-10-CM | POA: Diagnosis not present

## 2022-05-26 DIAGNOSIS — R768 Other specified abnormal immunological findings in serum: Secondary | ICD-10-CM | POA: Diagnosis not present

## 2022-05-26 DIAGNOSIS — E8721 Acute metabolic acidosis: Secondary | ICD-10-CM | POA: Diagnosis not present

## 2022-05-31 ENCOUNTER — Encounter: Payer: Self-pay | Admitting: Nurse Practitioner

## 2022-05-31 ENCOUNTER — Ambulatory Visit: Payer: Medicaid Other | Admitting: Nurse Practitioner

## 2022-05-31 VITALS — BP 132/86 | HR 82 | Ht 61.0 in | Wt 111.0 lb

## 2022-05-31 DIAGNOSIS — E559 Vitamin D deficiency, unspecified: Secondary | ICD-10-CM

## 2022-05-31 DIAGNOSIS — I10 Essential (primary) hypertension: Secondary | ICD-10-CM | POA: Diagnosis not present

## 2022-05-31 DIAGNOSIS — F17200 Nicotine dependence, unspecified, uncomplicated: Secondary | ICD-10-CM | POA: Diagnosis not present

## 2022-05-31 DIAGNOSIS — E782 Mixed hyperlipidemia: Secondary | ICD-10-CM

## 2022-05-31 DIAGNOSIS — E785 Hyperlipidemia, unspecified: Secondary | ICD-10-CM | POA: Insufficient documentation

## 2022-05-31 DIAGNOSIS — R808 Other proteinuria: Secondary | ICD-10-CM

## 2022-05-31 MED ORDER — AMLODIPINE BESYLATE 5 MG PO TABS: 5.0000 mg | ORAL_TABLET | Freq: Every day | ORAL | 1 refills | Status: DC

## 2022-05-31 NOTE — Patient Instructions (Signed)

## 2022-05-31 NOTE — Assessment & Plan Note (Signed)
Patient has established care with nephrology Patient encouraged to maintain close follow-up with nephrology, Lab Results  Component Value Date   NA 138 05/08/2022   K 4.3 05/08/2022   CO2 19 (L) 05/08/2022   GLUCOSE 83 05/08/2022   BUN 9 05/08/2022   CREATININE 0.75 05/08/2022   CALCIUM 9.5 05/08/2022   EGFR 105 05/08/2022   GFRNONAA >60 10/08/2021   

## 2022-05-31 NOTE — Assessment & Plan Note (Signed)
Smokes about 1 pack/day  Asked about quitting: confirms that he/she currently smokes cigarettes Advise to quit smoking: Educated about QUITTING to reduce the risk of cancer, cardio and cerebrovascular disease. Assess willingness: Unwilling to quit at this time, patient encouraged to work on cutting back  assist with counseling and pharmacotherapy: Counseled for 5 minutes and literature provided. Arrange for follow up: follow up in 6  months and continue to offer help.

## 2022-05-31 NOTE — Assessment & Plan Note (Signed)
BP Readings from Last 3 Encounters:  05/31/22 132/86  03/06/22 127/86  11/08/21 136/78  Chronic condition well-controlled on amlodipine 5 mg daily Continue current medication DASH diet advised engage in regular daily exercises at least for 50 minutes weekly

## 2022-05-31 NOTE — Assessment & Plan Note (Signed)
Lab Results  Component Value Date   CHOL 234 (H) 05/08/2022   HDL 56 05/08/2022   LDLCALC 123 (H) 05/08/2022   TRIG 315 (H) 05/08/2022   CHOLHDL 4.2 05/08/2022  Patient counseled on low-fat diet Smoking cessation education completed

## 2022-05-31 NOTE — Assessment & Plan Note (Signed)
Last vitamin D Lab Results  Component Value Date   VD25OH 19.9 (L) 05/08/2022  Currently taking vitamin D 50,000 units once weekly Patient told to start vitamin D 1000 units after completing the 50,000 units dose We will recheck vitamin D levels at her next visit

## 2022-05-31 NOTE — Progress Notes (Signed)
Jaclyn Howe     MRN: [id number]      DOB: 1986-06-09   HPI Jaclyn Howe with past medical history of essential hypertension, nicotine addiction, proteinuria is here for follow up and re-evaluation of chronic medical conditions, medication management and review of any available recent lab   There are no specific complaints   Hypertension.  Currently taking amlodipine 5 mg daily, reports that she has been taking medication as prescribed, denies chest pain, edema, dizziness.  Nicotine addiction. Started smoking at 18, smokes one pack daily, not ready to quit.  Need to quit smoking including risk of lung cancer, stroke, heart disease, COPD discussed with patient.     ROS Denies recent fever or chills. Denies sinus pressure, nasal congestion, ear pain or sore throat. Denies chest congestion, productive cough or wheezing. Denies chest pains, palpitations and leg swelling Denies abdominal pain, nausea, vomiting,diarrhea or constipation.   Denies dysuria, frequency, hesitancy or incontinence. Denies joint pain, swelling and limitation in mobility. Denies headaches, seizures, numbness, or tingling. Denies depression, anxiety or insomnia. Denies skin break down or rash.   PE  BP 132/86 (BP Location: Right Arm, Patient Position: Sitting, Cuff Size: Normal)   Pulse 82   Ht _0  (1.549 m)   Wt 111 lb (50.3 kg)   SpO2 98%   BMI 20.97 kg/m   Patient alert and oriented and in no cardiopulmonary distress.  Chest: Clear to auscultation bilaterally.  CVS: S1, S2 no murmurs, no S3.Regular rate.  ABD: Soft non tender.   Ext: No edema  MS: Adequate ROM spine, shoulders, hips and knees.  Psych: Good eye contact, normal affect. Memory intact not anxious or depressed appearing.  CNS: CN 2-12 intact, power,  normal throughout.no focal deficits noted.   Assessment & Plan  Essential hypertension BP Readings from Last 3 Encounters:  05/31/22 132/86  03/06/22 127/86  11/08/21  136/78  Chronic condition well-controlled on amlodipine 5 mg daily Continue current medication DASH diet advised engage in regular daily exercises at least for 50 minutes weekly  [place] about 1 pack/day  Asked about quitting: confirms that he/she currently smokes cigarettes Advise to quit smoking: Educated about QUITTING to reduce the risk of cancer, cardio and cerebrovascular disease. Assess willingness: Unwilling to quit at this time, patient encouraged to work on cutting back  assist with counseling and pharmacotherapy: Counseled for 5 minutes and literature provided. Arrange for follow up: follow up in 6  months and continue to offer help.  Proteinuria Patient has established care with nephrology Patient encouraged to maintain close follow-up with nephrology, Lab Results  Component Value Date   NA 138 05/08/2022   K 4.3 05/08/2022   CO2 19 (L) 05/08/2022   GLUCOSE 83 05/08/2022   BUN 9 05/08/2022   CREATININE 0.75 05/08/2022   CALCIUM 9.5 05/08/2022   EGFR 105 05/08/2022   GFRNONAA >60 10/08/2021    Vitamin D deficiency Last vitamin D Lab Results  Component Value Date   VD25OH 19.9 (L) 05/08/2022  Currently taking vitamin D 50,000 units once weekly Patient told to start vitamin D 1000 units after completing the 50,000 units dose We will recheck vitamin D levels at her next visit  Hyperlipidemia Lab Results  Component Value Date   CHOL 234 (H) 05/08/2022   HDL 56 05/08/2022   LDLCALC 123 (H) 05/08/2022   TRIG 315 (H) 05/08/2022   CHOLHDL 4.2 05/08/2022  Patient counseled on low-fat diet Smoking cessation education completed

## 2022-06-27 ENCOUNTER — Ambulatory Visit (INDEPENDENT_AMBULATORY_CARE_PROVIDER_SITE_OTHER): Payer: Medicaid Other | Admitting: *Deleted

## 2022-06-27 DIAGNOSIS — Z3042 Encounter for surveillance of injectable contraceptive: Secondary | ICD-10-CM

## 2022-06-27 MED ORDER — MEDROXYPROGESTERONE ACETATE 150 MG/ML IM SUSP
150.0000 mg | Freq: Once | INTRAMUSCULAR | Status: AC
  Administered 2022-06-27: 150 mg via INTRAMUSCULAR

## 2022-06-27 NOTE — Progress Notes (Signed)
   NURSE VISIT- INJECTION  SUBJECTIVE:  Jaclyn Howe is a 36 y.o. G61P1001 female here for a Depo Provera for contraception/period management. She is a GYN patient.   OBJECTIVE:  There were no vitals taken for this visit.  Appears well, in no apparent distress  Injection administered in: Left deltoid  Meds ordered this encounter  Medications   medroxyPROGESTERone (DEPO-PROVERA) injection 150 mg    ASSESSMENT: GYN patient Depo Provera for contraception/period management PLAN: Follow-up: in 11-13 weeks for next Depo   Malachy Mood  06/27/2022 9:26 AM

## 2022-07-06 ENCOUNTER — Encounter: Payer: Medicaid Other | Admitting: Nurse Practitioner

## 2022-07-11 ENCOUNTER — Telehealth: Payer: Self-pay | Admitting: Nurse Practitioner

## 2022-07-11 ENCOUNTER — Other Ambulatory Visit: Payer: Self-pay

## 2022-07-11 DIAGNOSIS — E559 Vitamin D deficiency, unspecified: Secondary | ICD-10-CM

## 2022-07-11 MED ORDER — VITAMIN D (ERGOCALCIFEROL) 1.25 MG (50000 UNIT) PO CAPS: 50000.0000 [IU] | ORAL_CAPSULE | ORAL | 0 refills | Status: DC

## 2022-07-11 NOTE — Telephone Encounter (Signed)
Refills sent

## 2022-07-11 NOTE — Telephone Encounter (Signed)
Pt called stating she is needing a refill for Vitamin D, Ergocalciferol, (DRISDOL) 1.25 MG (50000 UNIT) CAPS capsule. States phar told her she could not buy this OTC. Can you please refill?      [facility]

## 2022-08-13 ENCOUNTER — Telehealth: Payer: Self-pay | Admitting: Nurse Practitioner

## 2022-08-13 ENCOUNTER — Other Ambulatory Visit: Payer: Self-pay

## 2022-08-13 DIAGNOSIS — E559 Vitamin D deficiency, unspecified: Secondary | ICD-10-CM

## 2022-08-13 MED ORDER — VITAMIN D (ERGOCALCIFEROL) 1.25 MG (50000 UNIT) PO CAPS: 50000.0000 [IU] | ORAL_CAPSULE | ORAL | 0 refills | Status: DC

## 2022-08-13 NOTE — Telephone Encounter (Signed)
Fola Pt    Pt called stating she is out of Vitamin D, Ergocalciferol, (DRISDOL) 1.25 MG (50000 UNIT) CAPS capsule. Wants to know if it can please refilled?   [facility]

## 2022-08-13 NOTE — Telephone Encounter (Signed)
Rx sent 

## 2022-09-12 DIAGNOSIS — E559 Vitamin D deficiency, unspecified: Secondary | ICD-10-CM | POA: Diagnosis not present

## 2022-09-12 DIAGNOSIS — N181 Chronic kidney disease, stage 1: Secondary | ICD-10-CM | POA: Diagnosis not present

## 2022-09-12 DIAGNOSIS — E8721 Acute metabolic acidosis: Secondary | ICD-10-CM | POA: Diagnosis not present

## 2022-09-12 DIAGNOSIS — I129 Hypertensive chronic kidney disease with stage 1 through stage 4 chronic kidney disease, or unspecified chronic kidney disease: Secondary | ICD-10-CM | POA: Diagnosis not present

## 2022-09-12 DIAGNOSIS — R768 Other specified abnormal immunological findings in serum: Secondary | ICD-10-CM | POA: Diagnosis not present

## 2022-09-19 ENCOUNTER — Ambulatory Visit (INDEPENDENT_AMBULATORY_CARE_PROVIDER_SITE_OTHER): Payer: Medicaid Other | Admitting: *Deleted

## 2022-09-19 DIAGNOSIS — Z3042 Encounter for surveillance of injectable contraceptive: Secondary | ICD-10-CM | POA: Diagnosis not present

## 2022-09-19 MED ORDER — MEDROXYPROGESTERONE ACETATE 150 MG/ML IM SUSP
150.0000 mg | Freq: Once | INTRAMUSCULAR | Status: AC
  Administered 2022-09-19: 150 mg via INTRAMUSCULAR

## 2022-09-19 NOTE — Progress Notes (Signed)
   NURSE VISIT- INJECTION  SUBJECTIVE:  Jaclyn Howe is a 36 y.o. G82P1001 female here for a Depo Provera for contraception/period management. She is a GYN patient.   OBJECTIVE:  There were no vitals taken for this visit.  Appears well, in no apparent distress  Injection administered in: Right deltoid  Meds ordered this encounter  Medications   medroxyPROGESTERone (DEPO-PROVERA) injection 150 mg    ASSESSMENT: GYN patient Depo Provera for contraception/period management PLAN: Follow-up: in 11-13 weeks for next Depo; schedule pap same day.    Levy Pupa  09/19/2022 9:35 AM

## 2022-09-22 DIAGNOSIS — E559 Vitamin D deficiency, unspecified: Secondary | ICD-10-CM | POA: Diagnosis not present

## 2022-09-22 DIAGNOSIS — I129 Hypertensive chronic kidney disease with stage 1 through stage 4 chronic kidney disease, or unspecified chronic kidney disease: Secondary | ICD-10-CM | POA: Diagnosis not present

## 2022-09-22 DIAGNOSIS — R768 Other specified abnormal immunological findings in serum: Secondary | ICD-10-CM | POA: Diagnosis not present

## 2022-09-22 DIAGNOSIS — N181 Chronic kidney disease, stage 1: Secondary | ICD-10-CM | POA: Diagnosis not present

## 2022-09-24 ENCOUNTER — Other Ambulatory Visit: Payer: Self-pay | Admitting: Nurse Practitioner

## 2022-09-24 DIAGNOSIS — I10 Essential (primary) hypertension: Secondary | ICD-10-CM

## 2022-09-27 ENCOUNTER — Telehealth: Payer: Self-pay | Admitting: Family Medicine

## 2022-09-27 NOTE — Telephone Encounter (Signed)
Pt called wanting to know when she had her last flu vaccine & if she is due for it now?  Can you please contact patient and let her know?

## 2022-09-28 NOTE — Telephone Encounter (Signed)
Spoke to pt let her know last flu was given 11/08/21.

## 2022-10-17 ENCOUNTER — Ambulatory Visit: Payer: Medicaid Other | Admitting: Adult Health

## 2022-10-31 ENCOUNTER — Ambulatory Visit (INDEPENDENT_AMBULATORY_CARE_PROVIDER_SITE_OTHER): Payer: Medicaid Other | Admitting: Family Medicine

## 2022-10-31 ENCOUNTER — Encounter: Payer: Self-pay | Admitting: Family Medicine

## 2022-10-31 VITALS — BP 133/85 | HR 91 | Ht 61.0 in | Wt 107.1 lb

## 2022-10-31 DIAGNOSIS — R7301 Impaired fasting glucose: Secondary | ICD-10-CM | POA: Diagnosis not present

## 2022-10-31 DIAGNOSIS — E559 Vitamin D deficiency, unspecified: Secondary | ICD-10-CM

## 2022-10-31 DIAGNOSIS — Z0001 Encounter for general adult medical examination with abnormal findings: Secondary | ICD-10-CM

## 2022-10-31 DIAGNOSIS — E038 Other specified hypothyroidism: Secondary | ICD-10-CM | POA: Diagnosis not present

## 2022-10-31 DIAGNOSIS — I10 Essential (primary) hypertension: Secondary | ICD-10-CM | POA: Diagnosis not present

## 2022-10-31 DIAGNOSIS — Z1159 Encounter for screening for other viral diseases: Secondary | ICD-10-CM

## 2022-10-31 DIAGNOSIS — Z720 Tobacco use: Secondary | ICD-10-CM | POA: Diagnosis not present

## 2022-10-31 DIAGNOSIS — E7849 Other hyperlipidemia: Secondary | ICD-10-CM | POA: Diagnosis not present

## 2022-10-31 DIAGNOSIS — M79605 Pain in left leg: Secondary | ICD-10-CM | POA: Diagnosis not present

## 2022-10-31 NOTE — Assessment & Plan Note (Signed)
Controlled She takes amlodipine 5 mg daily She reports compliance with treatment regimen She denies headaches, dizziness, blurred vision, and chest pain BP Readings from Last 3 Encounters:  10/31/22 133/85  05/31/22 132/86  03/06/22 127/86

## 2022-10-31 NOTE — Assessment & Plan Note (Signed)
Physical exam as documented Counseling is done on healthy lifestyle involving commitment to 150 minutes of exercise per week,  Discussed heart-healthy diet and attaining a healthy weight Changes in health habits are decided on by the patient with goals and time frames set for achieving them. Immunization and cancer screening needs are specifically addressed at this visit     

## 2022-10-31 NOTE — Progress Notes (Signed)
Complete physical exam  Patient: Jaclyn Howe   DOB: Jun 14, 1986   36 y.o. Female  MRN: [id number]  Subjective:    Chief Complaint  Patient presents with   Annual Exam    Cpe today.    Leg Pain    Pt reports leg pain ongoing for about 3 weeks approximately since 10/10/2022, states it hurts to walk at times and when she sits for long periods it aches more.     Jaclyn Howe is a 36 y.o. female who presents today for a complete physical exam. She reports consuming a general diet. Home exercise routine includes walking 1/2 hrs per 2 days weekly. She generally feels well. She reports sleeping well. She does have additional problems to discuss today.   Left leg pain: Onset of symptoms 3 weeks ago.  She complains of pain that starts at the left hip and radiates to the left knee.  She complains of left knee effusion with tenderness to palpation.  She describes the pain as throbbing and rates her pain 10 out of 10.  She reports increased pain when she stands up after prolonged sitting.  She complains of limping due to hip pain when standing up.She reports feeling a lot of pressure in her lower legs, noting that the pain subsides after 5 to 10 minutes.  She reports minimal relief of her symptoms with conservative measurements and use of ibuprofen and Tylenol.  Tobacco use: She reports smoking 1 pack of cigarettes for 2 days.  Most recent fall risk assessment:    10/31/2022    8:57 AM  [place] in the past year? 0  Number falls in past yr: 0  Injury with Fall? 0  Risk for fall due to : No Fall Risks  Follow up Falls evaluation completed     Most recent depression screenings:    10/31/2022    8:58 AM 05/31/2022    1:11 PM  PHQ 2/9 Scores  PHQ - 2 Score 3 0  PHQ- 9 Score 3     Vision:Not within last year , Dental: No current dental problems and Last dental visit: 12/2021, STD: Declined to answer. , and PSA: Declines PSA testing at this time n/a  Patient Active Problem List    Diagnosis Date Noted   Encounter for annual general medical examination with abnormal findings in adult 10/31/2022   Vitamin D deficiency 05/31/2022   Hyperlipidemia 05/31/2022   Foul smelling urine 03/06/2022   Proteinuria 03/06/2022   Left leg pain 11/08/2021   Encounter for surveillance of injectable contraceptive 10/16/2021   Encounter for well woman exam with routine gynecological exam 10/16/2021   Annual physical exam 02/03/2021   Dry eyes 09/30/2020   Essential hypertension 07/22/2020   Excessive sweating 07/22/2020   Tobacco use 07/13/2008   Past Medical History:  Diagnosis Date   Allergic rhinitis 06/22/2016   Chronic abdominal pain    Chronic headache    Epigastric pain 07/07/2018   Hypertension    Migraine headache 07/13/2008   Qualifier: Diagnosis of  By: Dierdre Harness     Nausea and vomiting    Recurrent   Past Surgical History:  Procedure Laterality Date   ELECTIVE ABORTION     Social History   Tobacco Use   Smoking status: Every Day    Packs/day: 1.00    Years: 3.00    Total pack years: 3.00    Types: Cigarettes    Start date: 09/21/2001   Smokeless tobacco:  Never  Vaping Use   Vaping Use: Never used  Substance Use Topics   Alcohol use: Not Currently    Comment: 32 ounces beer and half pint liquor   Drug use: Not Currently    Types: Marijuana   Social History   Socioeconomic History   Marital status: Single    Spouse name: Not on file   Number of children: 1   Years of education: Not on file   Highest education level: High school graduate  Occupational History   Not on file  Tobacco Use   Smoking status: Every Day    Packs/day: 1.00    Years: 3.00    Total pack years: 3.00    Types: Cigarettes    Start date: 09/21/2001   Smokeless tobacco: Never  Vaping Use   Vaping Use: Never used  Substance and Sexual Activity   Alcohol use: Not Currently    Comment: 32 ounces beer and half pint liquor   Drug use: Not Currently    Types:  Marijuana   Sexual activity: Yes    Birth control/protection: Injection  Other Topics Concern   Not on file  Social History Narrative   Lives with Ria Clock son 69 years old      Dog: [place]       Enjoy: playing cards, hanging out with friends      Diet: eats all food groups   Caffeine: soda- 2-3 16 oz bottles   Water: 1 bottle a daily       Wear seat belt   Smoke detectors at home   [employer]   No weapons     Social Determinants of Health   Financial Resource Strain: [place]  (10/16/2021)   Overall Financial Resource Strain (CARDIA)    Difficulty of Paying Living Expenses: Not very hard  Food Insecurity: No Food Insecurity (10/16/2021)   Hunger Vital Sign    Worried About Running Out of Food in the Last Year: Never true    [place] in the Last Year: Never true  Transportation Needs: No Transportation Needs (10/16/2021)   PRAPARE - Hydrologist (Medical): No    Lack of Transportation (Non-Medical): No  Physical Activity: Inactive (10/16/2021)   Exercise Vital Sign    Days of Exercise per Week: 0 days    Minutes of Exercise per Session: 10 min  Stress: No Stress Concern Present (10/16/2021)   [place]    Feeling of Stress : Not at all  Social Connections: Socially Isolated (10/16/2021)   Social Connection and Isolation Panel [NHANES]    Frequency of Communication with Friends and Family: Never    Frequency of Social Gatherings with Friends and Family: Once a week    Attends Religious Services: Never    Marine scientist or Organizations: No    Attends Archivist Meetings: Never    Marital Status: Living with partner  Intimate Partner Violence: Not At Risk (10/16/2021)   Humiliation, Afraid, Rape, and Kick questionnaire    Fear of Current or Ex-Partner: No    Emotionally Abused: No    Physically Abused: No    Sexually Abused: No   Family  Status  Relation Name Status   PGF  Deceased   PGM  Deceased   MGM  Deceased   MGF  Deceased   Father  Deceased   Mother  Deceased   Sister  Alive  Sister  Alive   Son  Alive   History reviewed. No pertinent family history. No Known Allergies    Patient Care Team: Alvira Monday, FNP as PCP - General (Family Medicine)   Outpatient Medications Prior to Visit  Medication Sig   amLODipine (NORVASC) 5 MG tablet TAKE 1 TABLET BY MOUTH ONCE DAILY.   medroxyPROGESTERone Acetate 150 MG/ML SUSY INJECT 1 ML INTRAMUSCULARLY ONCE EVERY 3 MONTHS.   Vitamin D, Ergocalciferol, (DRISDOL) 1.25 MG (50000 UNIT) CAPS capsule Take 1 capsule (50,000 Units total) by mouth every 7 (seven) days.   No facility-administered medications prior to visit.    Review of Systems  Constitutional:  Negative for chills, fever and malaise/fatigue.  HENT:  Negative for congestion and sinus pain.   Eyes:  Negative for pain, discharge and redness.  Respiratory:  Negative for cough, sputum production and shortness of breath.   Cardiovascular:  Negative for chest pain, palpitations, claudication and leg swelling.  Gastrointestinal:  Negative for diarrhea, heartburn and nausea.  Genitourinary:  Negative for flank pain and frequency.  Musculoskeletal:  Negative for back pain and joint pain.       Left leg pain  Skin:  Negative for itching.  Neurological:  Negative for dizziness, seizures and headaches.  Endo/Heme/Allergies:  Negative for environmental allergies.  Psychiatric/Behavioral:  Negative for memory loss. The patient does not have insomnia.           Objective:     BP 133/85   Pulse 91   Ht _0  (1.549 m)   Wt 107 lb 1.3 oz (48.6 kg)   SpO2 98%   BMI 20.23 kg/m  BP Readings from Last 3 Encounters:  10/31/22 133/85  05/31/22 132/86  03/06/22 127/86   Wt Readings from Last 3 Encounters:  10/31/22 107 lb 1.3 oz (48.6 kg)  05/31/22 111 lb (50.3 kg)  03/06/22 113 lb (51.3 kg)       Physical Exam HENT:     Head: Normocephalic.     Right Ear: External ear normal.     Left Ear: External ear normal.     Mouth/Throat:     Mouth: Mucous membranes are moist.  Eyes:     Extraocular Movements: Extraocular movements intact.     Pupils: Pupils are equal, round, and reactive to light.  Cardiovascular:     Rate and Rhythm: Normal rate and regular rhythm.     Pulses: Normal pulses.     Heart sounds: Normal heart sounds.  Pulmonary:     Effort: No respiratory distress.     Breath sounds: No wheezing or rhonchi.  Abdominal:     Palpations: Abdomen is soft.     Tenderness: There is no right CVA tenderness or left CVA tenderness.  Musculoskeletal:        General: No swelling or signs of injury.     Cervical back: No rigidity.     Right lower leg: No edema.     Left lower leg: No edema.     Comments: Pain and limping of the left leg is noted when standing after prolonged sitting  She denies pain with palpation of the left knee, and there are no effusion or signs of inflammation of the left knee seen today  Skin:    Findings: No erythema or rash.  Neurological:     Mental Status: She is alert and oriented to person, place, and time.     GCS: GCS eye subscore is 4. GCS verbal subscore is  5.     Sensory: No sensory deficit.     Motor: No atrophy, abnormal muscle tone or pronator drift.     Coordination: Romberg sign negative. Finger-Nose-Finger Test normal.     Gait: Gait abnormal.  Psychiatric:     Comments: Normal affect      No results found for any visits on 10/31/22. Last CBC Lab Results  Component Value Date   WBC 5.8 05/08/2022   HGB 14.7 05/08/2022   HCT 43.6 05/08/2022   MCV 97 05/08/2022   MCH 32.5 05/08/2022   RDW 12.6 05/08/2022   PLT 380 40/98/1191   Last metabolic panel Lab Results  Component Value Date   GLUCOSE 83 05/08/2022   NA 138 05/08/2022   K 4.3 05/08/2022   CL 103 05/08/2022   CO2 19 (L) 05/08/2022   BUN 9 05/08/2022    CREATININE 0.75 05/08/2022   EGFR 105 05/08/2022   CALCIUM 9.5 05/08/2022   PROT 7.4 05/08/2022   ALBUMIN 4.7 05/08/2022   LABGLOB 2.7 05/08/2022   AGRATIO 1.7 05/08/2022   BILITOT 0.4 05/08/2022   ALKPHOS 103 05/08/2022   AST 27 05/08/2022   ALT 21 05/08/2022   ANIONGAP 9 10/08/2021   Last lipids Lab Results  Component Value Date   CHOL 234 (H) 05/08/2022   HDL 56 05/08/2022   LDLCALC 123 (H) 05/08/2022   TRIG 315 (H) 05/08/2022   CHOLHDL 4.2 05/08/2022   Last hemoglobin A1c Lab Results  Component Value Date   HGBA1C 5.4 05/08/2022   Last thyroid functions Lab Results  Component Value Date   TSH 0.867 05/08/2022   Last vitamin D Lab Results  Component Value Date   VD25OH 19.9 (L) 05/08/2022   Last vitamin B12 and Folate No results found for: "VITAMINB12", "FOLATE"      Assessment & Plan:    Routine Health Maintenance and Physical Exam  Immunization History  Administered Date(s) Administered   Hpv-Unspecified 11/14/2009   Influenza,inj,Quad PF,6+ Mos 01/26/2015, 10/08/2018, 11/08/2021   Moderna Sars-Covid-2 Vaccination 07/30/2020, 08/27/2020   Td 07/09/2005    Health Maintenance  Topic Date Due   Hepatitis C Screening  Never done   HPV VACCINES (2 - 3-dose series) 12/12/2009   COVID-19 Vaccine (3 - Moderna series) 10/22/2020   INFLUENZA VACCINE  07/24/2022   PAP SMEAR-Modifier  10/12/2022   TETANUS/TDAP  06/22/2026   HIV Screening  Completed    Discussed health benefits of physical activity, and encouraged her to engage in regular exercise appropriate for her age and condition.  Problem List Items Addressed This Visit       Cardiovascular and Mediastinum   Essential hypertension    Controlled She takes amlodipine 5 mg daily She reports compliance with treatment regimen She denies headaches, dizziness, blurred vision, and chest pain BP Readings from Last 3 Encounters:  10/31/22 133/85  05/31/22 132/86  03/06/22 127/86         Relevant  Orders   CMP14+EGFR   CBC with Differential/Platelet     Other   Tobacco use    Smokes about 2 pack for 2day  Asked about quitting: confirms that she currently smokes cigarettes Advise to quit smoking: Educated about QUITTING to reduce the risk of cancer, cardio and cerebrovascular disease. Assess willingness: Unwilling to quit at this time, but is working on cutting back. Assist with counseling and pharmacotherapy: Counseled for 5 minutes and literature provided. Arrange for follow up: follow up in 3 months and continue to offer help.  Left leg pain    Referral placed to orthopedic surgery for further evaluation and to rule out avascular necrosis of the left hip Encourage patient to continue conservative management with over-the-counter analgesic as needed for pain       Relevant Orders   Ambulatory referral to Orthopedic Surgery   Vitamin D deficiency   Relevant Orders   VITAMIN D 25 Hydroxy (Vit-D Deficiency, Fractures)   Hyperlipidemia   Relevant Orders   Lipid panel   Encounter for annual general medical examination with abnormal findings in adult    Physical exam as documented Counseling is done on healthy lifestyle involving commitment to 150 minutes of exercise per week,  Discussed heart-healthy diet and attaining a healthy weight Changes in health habits are decided on by the patient with goals and time frames set for achieving them. Immunization and cancer screening needs are specifically addressed at this visit          Other Visit Diagnoses     IFG (impaired fasting glucose)    -  Primary   Relevant Orders   Hemoglobin A1c   Need for hepatitis C screening test       Relevant Orders   Hepatitis C antibody   Other specified hypothyroidism       Relevant Orders   TSH + free T4      Return in about 3 months (around 01/31/2023).     Alvira Monday, FNP

## 2022-10-31 NOTE — Patient Instructions (Signed)
I appreciate the opportunity to provide care to you today!    Follow up:  3 months  Labs: please stop by the lab during the week to get your blood drawn (CBC, CMP, TSH, Lipid profile, HgA1c, Vit D)  Screening:  Hep C    Please continue to a heart-healthy diet and increase your physical activities. Try to exercise for at least three times a week.      It was a pleasure to see you and I look forward to continuing to work together on your health and well-being. Please do not hesitate to call the office if you need care or have questions about your care.   Have a wonderful day and week. With Gratitude, Gilmore Laroche MSN, FNP-BC

## 2022-10-31 NOTE — Assessment & Plan Note (Signed)
Smokes about 2 pack for 2day  Asked about quitting: confirms that she currently smokes cigarettes Advise to quit smoking: Educated about QUITTING to reduce the risk of cancer, cardio and cerebrovascular disease. Assess willingness: Unwilling to quit at this time, but is working on cutting back. Assist with counseling and pharmacotherapy: Counseled for 5 minutes and literature provided. Arrange for follow up: follow up in 3 months and continue to offer help.

## 2022-10-31 NOTE — Assessment & Plan Note (Signed)
Referral placed to orthopedic surgery for further evaluation and to rule out avascular necrosis of the left hip Encourage patient to continue conservative management with over-the-counter analgesic as needed for pain

## 2022-11-01 ENCOUNTER — Telehealth: Payer: Self-pay | Admitting: Family Medicine

## 2022-11-02 NOTE — Telephone Encounter (Signed)
Left

## 2022-11-05 DIAGNOSIS — E038 Other specified hypothyroidism: Secondary | ICD-10-CM | POA: Diagnosis not present

## 2022-11-05 DIAGNOSIS — E559 Vitamin D deficiency, unspecified: Secondary | ICD-10-CM | POA: Diagnosis not present

## 2022-11-05 DIAGNOSIS — R7301 Impaired fasting glucose: Secondary | ICD-10-CM | POA: Diagnosis not present

## 2022-11-05 DIAGNOSIS — Z1159 Encounter for screening for other viral diseases: Secondary | ICD-10-CM | POA: Diagnosis not present

## 2022-11-05 DIAGNOSIS — E7849 Other hyperlipidemia: Secondary | ICD-10-CM | POA: Diagnosis not present

## 2022-11-05 DIAGNOSIS — I10 Essential (primary) hypertension: Secondary | ICD-10-CM | POA: Diagnosis not present

## 2022-11-06 LAB — TSH+FREE T4
Free T4: 1.09 ng/dL (ref 0.82–1.77)
TSH: 1.1 u[IU]/mL (ref 0.450–4.500)

## 2022-11-06 LAB — LIPID PANEL
Chol/HDL Ratio: 3.3 ratio (ref 0.0–4.4)
Cholesterol, Total: 208 mg/dL — ABNORMAL HIGH (ref 100–199)
HDL: 63 mg/dL (ref >39)
LDL Chol Calc (NIH): 91 mg/dL (ref 0–99)
Triglycerides: 328 mg/dL — ABNORMAL HIGH (ref 0–149)
VLDL Cholesterol Cal: 54 mg/dL — ABNORMAL HIGH (ref 5–40)

## 2022-11-06 LAB — CMP14+EGFR
ALT: 20 IU/L (ref 0–32)
AST: 23 IU/L (ref 0–40)
Albumin/Globulin Ratio: 1.7 (ref 1.2–2.2)
Albumin: 4.5 g/dL (ref 3.9–4.9)
Alkaline Phosphatase: 91 IU/L (ref 44–121)
BUN/Creatinine Ratio: 9 (ref 9–23)
BUN: 7 mg/dL (ref 6–20)
Bilirubin Total: 0.4 mg/dL (ref 0.0–1.2)
CO2: 19 mmol/L — ABNORMAL LOW (ref 20–29)
Calcium: 9.6 mg/dL (ref 8.7–10.2)
Chloride: 103 mmol/L (ref 96–106)
Creatinine, Ser: 0.76 mg/dL (ref 0.57–1.00)
Globulin, Total: 2.7 g/dL (ref 1.5–4.5)
Glucose: 88 mg/dL (ref 70–99)
Potassium: 4.3 mmol/L (ref 3.5–5.2)
Sodium: 138 mmol/L (ref 134–144)
Total Protein: 7.2 g/dL (ref 6.0–8.5)
eGFR: 103 mL/min/{1.73_m2} (ref >59)

## 2022-11-06 LAB — CBC WITH DIFFERENTIAL/PLATELET
Basophils Absolute: 0.1 10*3/uL (ref 0.0–0.2)
Basos: 1 %
EOS (ABSOLUTE): 0 10*3/uL (ref 0.0–0.4)
Eos: 1 %
Hematocrit: 45.3 % (ref 34.0–46.6)
Hemoglobin: 15.2 g/dL (ref 11.1–15.9)
Immature Grans (Abs): 0 10*3/uL (ref 0.0–0.1)
Immature Granulocytes: 0 %
Lymphocytes Absolute: 2.9 10*3/uL (ref 0.7–3.1)
Lymphs: 50 %
MCH: 33.4 pg — ABNORMAL HIGH (ref 26.6–33.0)
MCHC: 33.6 g/dL (ref 31.5–35.7)
MCV: 100 fL — ABNORMAL HIGH (ref 79–97)
Monocytes Absolute: 0.5 10*3/uL (ref 0.1–0.9)
Monocytes: 8 %
Neutrophils Absolute: 2.3 10*3/uL (ref 1.4–7.0)
Neutrophils: 40 %
Platelets: 377 10*3/uL (ref 150–450)
RBC: 4.55 x10E6/uL (ref 3.77–5.28)
RDW: 13.1 % (ref 11.7–15.4)
WBC: 5.7 10*3/uL (ref 3.4–10.8)

## 2022-11-06 LAB — HEPATITIS C ANTIBODY: Hep C Virus Ab: NONREACTIVE

## 2022-11-06 LAB — VITAMIN D 25 HYDROXY (VIT D DEFICIENCY, FRACTURES): Vit D, 25-Hydroxy: 42.6 ng/mL (ref 30.0–100.0)

## 2022-11-06 LAB — HEMOGLOBIN A1C
Est. average glucose Bld gHb Est-mCnc: 100 mg/dL
Hgb A1c MFr Bld: 5.1 % (ref 4.8–5.6)

## 2022-11-07 ENCOUNTER — Telehealth: Payer: Self-pay | Admitting: Family Medicine

## 2022-11-07 NOTE — Telephone Encounter (Signed)
Patient calling about lab results. 

## 2022-11-08 NOTE — Progress Notes (Signed)
Please inform the patient that her cholesterol levels are elevated; I recommend taking over-the-counter fish oil 2000 mg twice daily.  I also recommend low carbs and fat diet with increased physical activities.

## 2022-11-08 NOTE — Telephone Encounter (Signed)
Pt spoken to this morning

## 2022-11-26 ENCOUNTER — Ambulatory Visit: Payer: Medicaid Other | Admitting: Orthopedic Surgery

## 2022-11-30 ENCOUNTER — Ambulatory Visit: Payer: Medicaid Other | Admitting: Orthopedic Surgery

## 2022-12-12 ENCOUNTER — Ambulatory Visit: Payer: Medicaid Other | Admitting: Adult Health

## 2022-12-18 ENCOUNTER — Other Ambulatory Visit: Payer: Self-pay | Admitting: Adult Health

## 2022-12-19 ENCOUNTER — Ambulatory Visit: Payer: Medicaid Other

## 2022-12-25 ENCOUNTER — Ambulatory Visit (INDEPENDENT_AMBULATORY_CARE_PROVIDER_SITE_OTHER): Payer: Medicaid Other | Admitting: *Deleted

## 2022-12-25 DIAGNOSIS — Z3042 Encounter for surveillance of injectable contraceptive: Secondary | ICD-10-CM | POA: Diagnosis not present

## 2022-12-25 MED ORDER — MEDROXYPROGESTERONE ACETATE 150 MG/ML IM SUSP
150.0000 mg | Freq: Once | INTRAMUSCULAR | Status: AC
  Administered 2022-12-25: 150 mg via INTRAMUSCULAR

## 2022-12-25 NOTE — Progress Notes (Signed)
   NURSE VISIT- INJECTION  SUBJECTIVE:  Jaclyn Howe is a 37 y.o. [id number] female here for a Depo Provera for contraception/period management. She is a GYN patient.   OBJECTIVE:  There were no vitals taken for this visit.  Appears well, in no apparent distress  Injection administered in: Right deltoid  Meds ordered this encounter  Medications   medroxyPROGESTERone (DEPO-PROVERA) injection 150 mg    ASSESSMENT: GYN patient Depo Provera for contraception/period management PLAN: Follow-up: in 11-13 weeks for next Depo   Alice Rieger  12/25/2022 1:55 PM

## 2022-12-27 ENCOUNTER — Ambulatory Visit: Payer: Medicaid Other | Admitting: Orthopedic Surgery

## 2022-12-27 ENCOUNTER — Ambulatory Visit (INDEPENDENT_AMBULATORY_CARE_PROVIDER_SITE_OTHER): Payer: Medicaid Other

## 2022-12-27 ENCOUNTER — Encounter: Payer: Self-pay | Admitting: Orthopedic Surgery

## 2022-12-27 VITALS — BP 136/90 | HR 103 | Ht 61.0 in | Wt 107.0 lb

## 2022-12-27 DIAGNOSIS — G8929 Other chronic pain: Secondary | ICD-10-CM

## 2022-12-27 DIAGNOSIS — M25562 Pain in left knee: Secondary | ICD-10-CM | POA: Diagnosis not present

## 2022-12-27 MED ORDER — IBUPROFEN 800 MG PO TABS: 800.0000 mg | ORAL_TABLET | Freq: Three times a day (TID) | ORAL | 1 refills | Status: DC | PRN

## 2022-12-27 NOTE — Progress Notes (Signed)
  Patient ID: Saleemah Mollenhauer, female   DOB: 29-Jul-1986, 37 y.o.   MRN: [id number]  Chief Complaint  Patient presents with   Knee Pain    Left for 4 weeks     Current Outpatient Medications  Medication Instructions   amLODipine (NORVASC) 5 mg, Oral, Daily   ibuprofen (ADVIL) 800 mg, Oral, Every 8 hours PRN   medroxyPROGESTERone Acetate 150 MG/ML SUSY INJECT 1 ML INTRAMUSCULARLY ONCE EVERY 3 MONTHS.   Vitamin D (Ergocalciferol) (DRISDOL) 50,000 Units, Oral, Every 7 days    No Known Allergies   37 year old female referred from medical provider with medial pain left knee for 4 weeks.  She denies any trauma but complains of giving way and occasional swelling.  She rates her pain as severe over the medial side of the left knee no prior surgery    Review of Systems  All other systems reviewed and are negative.   Past Medical History:  Diagnosis Date   Allergic rhinitis 06/22/2016   Chronic abdominal pain    Chronic headache    Epigastric pain 07/07/2018   Hypertension    Migraine headache 07/13/2008   Qualifier: Diagnosis of  By: Dierdre Harness     Nausea and vomiting    Recurrent    Past Surgical History:  Procedure Laterality Date   ELECTIVE ABORTION      PHYSICAL EXAM  BP (!) 136/90   Pulse (!) 103   Ht 5\' 1"  (1.549 m)   Wt 107 lb (48.5 kg)   BMI 20.22 kg/m  GENERAL appearance reveals no gross abnormalities, normal development grooming and hygiene   MENTAL STATUS we note that the patient is awake alert and oriented to person place and time MOOD/AFFECT ARE NORMAL   GAIT reveals slight limp in the effected limb  Ortho Exam VASC 2+ dorsalis pedis pulse normal capillary refill excellent warmth to the extremity  NEURO normal sensation and no pathologic reflexes  LYMPH deferred noncontributory  MEDICAL DECISION MAKING  A.  Encounter Diagnosis  Name Primary?   Acute pain of left knee Yes   Differential diagnosis includes torn medial meniscus  The patient  has [facility] and will be started on the anti-inflammatories and have the physical therapy prior to ordering the MRI   B. DATA ANALYSED:  IMAGING: Independent interpretation of images: The office images do not show a fracture dislocation or joint effusion  Orders: Physical therapy  Outside records reviewed: Medical provider notes  C. MANAGEMENT 30 days of anti-inflammatories, physical therapy  Return in 30 days  Meds ordered this encounter  Medications   ibuprofen (ADVIL) 800 MG tablet    Sig: Take 1 tablet (800 mg total) by mouth every 8 (eight) hours as needed.    Dispense:  90 tablet    Refill:  1

## 2022-12-27 NOTE — Patient Instructions (Signed)
Physical therapy has been ordered for you at [facility]. They should call you to schedule, [phone] is the phone number to call, if you want to call to schedule.   

## 2023-01-09 ENCOUNTER — Ambulatory Visit (HOSPITAL_COMMUNITY): Payer: Commercial Managed Care - HMO | Attending: Family Medicine

## 2023-01-09 ENCOUNTER — Other Ambulatory Visit: Payer: Self-pay

## 2023-01-09 DIAGNOSIS — R262 Difficulty in walking, not elsewhere classified: Secondary | ICD-10-CM | POA: Diagnosis present

## 2023-01-09 DIAGNOSIS — M25562 Pain in left knee: Secondary | ICD-10-CM | POA: Diagnosis not present

## 2023-01-09 NOTE — Therapy (Signed)
OUTPATIENT PHYSICAL THERAPY LOWER EXTREMITY EVALUATION   Patient Name: Jaclyn Howe MRN: [id number] DOB:09/08/1986, 37 y.o., female Today's Date: 01/09/2023  END OF SESSION:  PT End of Session - 01/09/23 1023     Visit Number 1    Number of Visits 6    Date for PT Re-Evaluation 01/30/23    Authorization Type [place] [employer]; please check auth    PT Start Time 1030    PT Stop Time 1110    PT Time Calculation (min) 40 min             Past Medical History:  Diagnosis Date   Allergic rhinitis 06/22/2016   Chronic abdominal pain    Chronic headache    Epigastric pain 07/07/2018   Hypertension    Migraine headache 07/13/2008   Qualifier: Diagnosis of  By: Dierdre Harness     Nausea and vomiting    Recurrent   Past Surgical History:  Procedure Laterality Date   ELECTIVE ABORTION     Patient Active Problem List   Diagnosis Date Noted   Encounter for annual general medical examination with abnormal findings in adult 10/31/2022   Vitamin D deficiency 05/31/2022   Hyperlipidemia 05/31/2022   Foul smelling urine 03/06/2022   Proteinuria 03/06/2022   Left leg pain 11/08/2021   Encounter for surveillance of injectable contraceptive 10/16/2021   Encounter for well woman exam with routine gynecological exam 10/16/2021   Annual physical exam 02/03/2021   Dry eyes 09/30/2020   Essential hypertension 07/22/2020   Excessive sweating 07/22/2020   Tobacco use 07/13/2008    PCP: Alvira Monday, FNP  REFERRING PROVIDER: Carole Civil, [place]  REFERRING DIAG: [phone] (ICD-10-CM) - Acute pain of left knee  THERAPY DIAG:  Left knee pain, unspecified chronicity - Plan: PT plan of care cert/re-cert  Difficulty in walking, not elsewhere classified - Plan: PT plan of care cert/re-cert  Rationale for Evaluation and Treatment: Rehabilitation  ONSET DATE: couple of months  SUBJECTIVE:   SUBJECTIVE STATEMENT: Pain started a couple  months ago; unknown reason; no know injury; went to PCP and she referred to Dr. Aline Brochure x-rays were normal.    PERTINENT HISTORY: No orthopedic issues PAIN:  Are you having pain? Yes: NPRS scale: 8/10 Pain location: medial knee joint Pain description: aching; stabbing when increases Aggravating factors: cold weather; unknown, sitting too long Relieving factors: unknown  PRECAUTIONS: None  WEIGHT BEARING RESTRICTIONS: No  FALLS:  Has patient fallen in last 6 months? No  LIVING ENVIRONMENT: Lives with: lives with their family Lives in: House/apartment Stairs: Yes: External: 10 steps; on left going up Has following equipment at home: None  OCCUPATION: not working currently  PLOF: Independent  PATIENT GOALS: my knee to get well  NEXT MD VISIT: 01/28/2023 @ 9:00 am  OBJECTIVE:   DIAGNOSTIC FINDINGS: X-ray report  Chief complaint medial pain left knee No trauma  Images AP both knees lateral and sunrise view left knee  Reading: Normal alignment normal joint spaces no fracture normal bone quality no bone lesions no effusion  Impression: Normal knee  PATIENT SURVEYS:  FOTO 41  COGNITION: Overall cognitive status: Within functional limits for tasks assessed     SENSATION: N/T occassionally medial left knee  EDEMA:  Mild effusion noted medial left knee   POSTURE: weight shift right  PALPATION: Point tender pes anserine bursa; swelling noted medial knee and hamstring  LOWER EXTREMITY ROM:  Active ROM Right eval Left eval  Hip  flexion    Hip extension    Hip abduction    Hip adduction    Hip internal rotation    Hip external rotation    Knee flexion  62  Knee extension  -10  Ankle dorsiflexion    Ankle plantarflexion    Ankle inversion    Ankle eversion     (Blank rows = not tested)  LOWER EXTREMITY VZD:[id number] with all left leg movement  MMT Right eval Left eval  Hip flexion  3  Hip extension  3-  Hip abduction    Hip adduction    Hip  internal rotation    Hip external rotation    Knee flexion  3-  Knee extension  3-  Ankle dorsiflexion  4-  Ankle plantarflexion    Ankle inversion    Ankle eversion     (Blank rows = not tested)   FUNCTIONAL TESTS:  5 times sit to stand: 23.54 sec no UE assist  GAIT: Distance walked: 50 ft Assistive device utilized: None Level of assistance: Modified independence Comments: antalgic gait; decreased stance left lower extremity   TODAY'S TREATMENT:                                                                                                                              DATE: physical therapy evaluation and HEP instruction    PATIENT EDUCATION:  Education details: Patient educated on exam findings, POC, scope of PT, HEP, and on what to expect next visit. Person educated: Patient Education method: Explanation, Demonstration, and Handouts Education comprehension: verbalized understanding, returned demonstration, verbal cues required, and tactile cues required   HOME EXERCISE PROGRAM: Access Code: [email or web address] URL: [email or web address] Date: 01/09/2023 Prepared by: AP - Rehab  Exercises - Seated Heel Slide  - 2 x daily - 7 x weekly - 1 sets - 10 reps - Seated Heel Toe Raises  - 2 x daily - 7 x weekly - 1 sets - 10 reps - Supine Hip Internal and External Rotation  - 2 x daily - 7 x weekly - 1 sets - 10 reps  ASSESSMENT:  CLINICAL IMPRESSION: Patient is a 37 y.o. female who was seen today for physical therapy evaluation and treatment for left knee pain. Patient presents to physical therapy with complaint of left knee pain and swelling. Patient demonstrates muscle weakness, reduced ROM, and fascial restrictions which are likely contributing to symptoms of pain and are negatively impacting patient ability to perform ADLs and functional mobility tasks. Patient will benefit from skilled physical therapy services to address these deficits to reduce pain and improve level  of function with ADLs and functional mobility tasks.   OBJECTIVE IMPAIRMENTS: Abnormal gait, decreased activity tolerance, decreased knowledge of condition, decreased knowledge of use of DME, decreased mobility, difficulty walking, decreased ROM, decreased strength, hypomobility, increased edema, increased fascial restrictions, impaired perceived functional ability, impaired flexibility, and pain.   ACTIVITY LIMITATIONS: bending, sitting, standing, squatting, sleeping, stairs, transfers,  bed mobility, bathing, toileting, dressing, locomotion level, and caring for others  PARTICIPATION LIMITATIONS: meal prep, cleaning, laundry, shopping, and community activity    REHAB POTENTIAL: Good  CLINICAL DECISION MAKING: Stable/uncomplicated  EVALUATION COMPLEXITY: Low   GOALS: Goals reviewed with patient? No  SHORT TERM GOALS: Target date: 01/23/2023 patient will be independent with initial HEP  Baseline: Goal status: INITIAL  2.  Patient will self report 30% improvement to improve tolerance for functional activity    Baseline:  Goal status: INITIAL   LONG TERM GOALS: Target date: 01/30/2023  Patient will be independent in self management strategies to improve quality of life and functional outcomes.  Baseline:  Goal status: INITIAL  2.  Patient will improve FOTO score to predicted value    Baseline:  Goal status: INITIAL  3.  Patient will increase  leg MMTs to 5/5 without pain to promote return to ambulation community distances with minimal deviation.  Baseline:  Goal status: INITIAL  4.  Patient will self report 50% improvement to improve tolerance for functional activity  Baseline:  Goal status: INITIAL  5.  Patient will improve 5 times sit to stand score from 23.54 sec to 15 sec to demonstrate improved functional mobility and increased lower extremity strength.  Baseline:  Goal status: INITIAL   PLAN:  PT FREQUENCY: 2x/week  PT DURATION: 3 weeks  PLANNED  INTERVENTIONS: Therapeutic exercises, Therapeutic activity, Neuromuscular re-education, Balance training, Gait training, Patient/Family education, Joint manipulation, Joint mobilization, Stair training, Orthotic/Fit training, DME instructions, Aquatic Therapy, Dry Needling, Electrical stimulation, Spinal manipulation, Spinal mobilization, Cryotherapy, Moist heat, Compression bandaging, scar mobilization, Splintting, Taping, Traction, Ultrasound, Ionotophoresis 4mg /ml Dexamethasone, and Manual therapy   PLAN FOR NEXT SESSION: Review HEP and goals; progress knee mobility and strength as able   11:18 AM, 01/09/23 Amahri Dengel Small Anacleto Batterman MPT [facility] [place] [phone] TW:[phone]

## 2023-01-22 ENCOUNTER — Encounter (HOSPITAL_COMMUNITY): Payer: Medicaid Other | Admitting: Physical Therapy

## 2023-01-22 ENCOUNTER — Telehealth (HOSPITAL_COMMUNITY): Payer: Self-pay | Admitting: Physical Therapy

## 2023-01-22 NOTE — Telephone Encounter (Signed)
Pt did not show for physical therapy appointment.  Called and left VM regarding missed appt and reminder fo next appointment.  Outlined the NS policy as well.  Teena Irani, PTA/CLT [place] Ph: [phone]

## 2023-01-23 ENCOUNTER — Other Ambulatory Visit (HOSPITAL_COMMUNITY)
Admission: RE | Admit: 2023-01-23 | Discharge: 2023-01-23 | Disposition: A | Discharge status: Home / Self Care | Payer: Commercial Managed Care - HMO | Source: Ambulatory Visit | Attending: Adult Health | Admitting: Adult Health

## 2023-01-23 ENCOUNTER — Encounter: Payer: Self-pay | Admitting: Adult Health

## 2023-01-23 ENCOUNTER — Ambulatory Visit (INDEPENDENT_AMBULATORY_CARE_PROVIDER_SITE_OTHER): Payer: Commercial Managed Care - HMO | Admitting: Adult Health

## 2023-01-23 VITALS — BP 127/87 | HR 80 | Ht 61.0 in | Wt 114.5 lb

## 2023-01-23 DIAGNOSIS — Z01419 Encounter for gynecological examination (general) (routine) without abnormal findings: Secondary | ICD-10-CM

## 2023-01-23 DIAGNOSIS — Z0000 Encounter for general adult medical examination without abnormal findings: Secondary | ICD-10-CM

## 2023-01-23 DIAGNOSIS — Z3042 Encounter for surveillance of injectable contraceptive: Secondary | ICD-10-CM | POA: Diagnosis not present

## 2023-01-23 MED ORDER — MEDROXYPROGESTERONE ACETATE 150 MG/ML IM SUSY: PREFILLED_SYRINGE | INTRAMUSCULAR | 4 refills | Status: DC

## 2023-01-23 NOTE — Progress Notes (Signed)
Subjective:     Patient ID: Jaclyn Howe, female   DOB: 1986/01/31, 37 y.o.   MRN: [id number]  HPI Jaclyn Howe is a 37 year old black female,single, G1P1001, in for  gyn exam and pap. She had physical with PCP 10/31/22.  She is on depo and happy with it.  PCP is Alvira Monday, NP  Review of Systems Patient denies any headaches, hearing loss, fatigue, blurred vision, shortness of breath, chest pain, abdominal pain, problems with bowel movements, urination, or intercourse(not active). No joint pain or mood swings.    Reviewed past medical,surgical, social and family history. Reviewed medications and allergies.  Objective:   Physical Exam BP 127/87 (BP Location: Left Arm, Patient Position: Sitting, Cuff Size: Normal)   Pulse 80   Ht 5\' 1"  (1.549 m)   Wt 114 lb 8 oz (51.9 kg)   BMI 21.63 kg/m     Skin warm and dry.Pelvic: external genitalia is normal in appearance no lesions, vagina: pink and moist,urethra has no lesions or masses noted, cervix:smooth and bulbous,pap with GC/CHL and  HR HPV genotyping performed,cervix bleed some with EC brush, uterus: normal size, shape and contour, non tender, no masses felt, adnexa: no masses or tenderness noted. Bladder is non tender and no masses felt.  AA is 0 Fall risk is low    01/23/2023   11:21 AM 10/31/2022    8:58 AM 05/31/2022    1:11 PM  Depression screen PHQ 2/9  Decreased Interest 1 3 0  Down, Depressed, Hopeless 0 0 0  PHQ - 2 Score 1 3 0  Altered sleeping 0 0   Tired, decreased energy 0 0   Change in appetite 0 0   Feeling bad or failure about yourself  0 0   Trouble concentrating 0 0   Moving slowly or fidgety/restless 0 0   Suicidal thoughts 0 0   PHQ-9 Score 1 3   Difficult doing work/chores  Somewhat difficult        01/23/2023   11:22 AM 10/31/2022    8:58 AM 10/16/2021    1:35 PM  GAD 7 : Generalized Anxiety Score  Nervous, Anxious, on Edge 0 0 0  Control/stop worrying 0 0 0  Worry too much - different things 0 0 0   Trouble relaxing 0 0 0  Restless 0 0 0  Easily annoyed or irritable 0 0 0  Afraid - awful might happen 0 0 0  Total GAD 7 Score 0 0 0      Upstream - 01/23/23 1125       Pregnancy Intention Screening   Does the patient want to become pregnant in the next year? No    Does the patient's partner want to become pregnant in the next year? No    Would the patient like to discuss contraceptive options today? No      Contraception Wrap Up   Current Method Hormonal Injection    End Method Hormonal Injection            Examination chaperoned by Levy Pupa LPN Assessment:     1. Routine general medical examination at a health care facility Pap sent  2. Encounter for routine gynecological examination with Papanicolaou smear of cervix Pap sent Pap in 3 years if normal Physical with PCP Labs with PCP  3. Encounter for surveillance of injectable contraceptive Will refill depo,next injection is 03/18/23 at 9:30 am Meds ordered this encounter  Medications   medroxyPROGESTERone Acetate 150 MG/ML SUSY  Sig: Inject 1 ML IM once every 3 months in office    Dispense:  1 mL    Refill:  4    Order Specific Question:   Supervising Provider    Answer:   Florian Buff [[id number]]       Plan:     Pap in 3 years if normal

## 2023-01-25 LAB — CYTOLOGY - PAP
Chlamydia: NEGATIVE
Comment: NEGATIVE
Comment: NEGATIVE
Comment: NORMAL
Diagnosis: NEGATIVE
High risk HPV: NEGATIVE
Neisseria Gonorrhea: NEGATIVE

## 2023-01-28 ENCOUNTER — Ambulatory Visit: Payer: Commercial Managed Care - HMO | Admitting: Orthopedic Surgery

## 2023-01-28 ENCOUNTER — Encounter: Payer: Self-pay | Admitting: Orthopedic Surgery

## 2023-01-28 DIAGNOSIS — M23322 Other meniscus derangements, posterior horn of medial meniscus, left knee: Secondary | ICD-10-CM

## 2023-01-28 DIAGNOSIS — G8929 Other chronic pain: Secondary | ICD-10-CM

## 2023-01-28 NOTE — Progress Notes (Signed)
Chief Complaint  Patient presents with   Knee Pain    Left/ feels somewhat better    This is a 37 year old female who has had physical therapy and has been on ibuprofen for greater than 30 days presents back with continued medial knee pain and difficulty walking.  She has painful knee extension.  Frequent episodes of giving way  Left Knee Exam   Muscle Strength  The patient has normal left knee strength.  Tenderness  The patient is experiencing tenderness in the medial joint line.  Range of Motion  Extension:  10 abnormal  Flexion:  110 abnormal   Tests  McMurray:  Medial - positive  Varus: negative Valgus: negative Drawer:  Anterior - negative     Posterior - negative Patellar apprehension: negative  Other  Erythema: absent Scars: absent Sensation: normal Pulse: present Swelling: none Effusion: effusion present       Encounter Diagnoses  Name Primary?   Chronic pain of left knee Yes   Derangement of posterior horn of medial meniscus of left knee     This is a 37 year old female has had an adequate course of physical therapy and ibuprofen NSAIDs presents with mechanical symptoms in the left knee and positive physical findings consistent with torn medial meniscus  Recommend MRI and follow-up after MRI to discuss results of MRI

## 2023-01-28 NOTE — Patient Instructions (Signed)
While we are working on your approval for MRI please go ahead and call to schedule your appointment with [facility] within at least one (1) week.   Central Scheduling [phone]  

## 2023-01-30 ENCOUNTER — Ambulatory Visit (HOSPITAL_COMMUNITY): Payer: Commercial Managed Care - HMO | Attending: Family Medicine

## 2023-01-30 DIAGNOSIS — M25562 Pain in left knee: Secondary | ICD-10-CM | POA: Insufficient documentation

## 2023-01-30 DIAGNOSIS — R262 Difficulty in walking, not elsewhere classified: Secondary | ICD-10-CM | POA: Diagnosis not present

## 2023-01-30 NOTE — Therapy (Signed)
OUTPATIENT PHYSICAL THERAPY LOWER EXTREMITY EVALUATION   Patient Name: Jaclyn Howe MRN: [id number] DOB:1986/11/22, 37 y.o., female Today's Date: 01/30/2023  END OF SESSION:  PT End of Session - 01/30/23 0950     Visit Number 2    Number of Visits 6    Date for PT Re-Evaluation 01/30/23    Authorization Type [employer]    Authorization Time Period 6 visits from 01/09/23 to 03/09/23    Authorization - Visit Number 1    Authorization - Number of Visits 6    Progress Note Due on Visit 6    PT Start Time 0946    PT Stop Time 1025    PT Time Calculation (min) 39 min             Past Medical History:  Diagnosis Date   Allergic rhinitis 06/22/2016   Chronic abdominal pain    Chronic headache    Epigastric pain 07/07/2018   Hypertension    Migraine headache 07/13/2008   Qualifier: Diagnosis of  By: Dierdre Harness     Nausea and vomiting    Recurrent   Past Surgical History:  Procedure Laterality Date   ELECTIVE ABORTION     Patient Active Problem List   Diagnosis Date Noted   Encounter for routine gynecological examination with Papanicolaou smear of cervix 01/23/2023   Routine general medical examination at a health care facility 01/23/2023   Encounter for annual general medical examination with abnormal findings in adult 10/31/2022   Vitamin D deficiency 05/31/2022   Hyperlipidemia 05/31/2022   Foul smelling urine 03/06/2022   Proteinuria 03/06/2022   Left leg pain 11/08/2021   Encounter for surveillance of injectable contraceptive 10/16/2021   Encounter for well woman exam with routine gynecological exam 10/16/2021   Annual physical exam 02/03/2021   Dry eyes 09/30/2020   Essential hypertension 07/22/2020   Excessive sweating 07/22/2020   Tobacco use 07/13/2008    PCP: Alvira Monday, FNP  REFERRING PROVIDER: Carole Civil, [place]  REFERRING DIAG: [phone] (ICD-10-CM) - Acute pain of left knee  THERAPY  DIAG:  Left knee pain, unspecified chronicity  Difficulty in walking, not elsewhere classified  Rationale for Evaluation and Treatment: Rehabilitation  ONSET DATE: couple of months  SUBJECTIVE:   SUBJECTIVE STATEMENT: Patient reports some improvement in the knee; still 6/10 pain.  Scheduled for an MRI 2/27 per Dr. Aline Brochure; has to sleep with a pillow between her knees at night. Maybe "50%" better   Eval:Pain started a couple months ago; unknown reason; no know injury; went to PCP and she referred to Dr. Aline Brochure x-rays were normal.    PERTINENT HISTORY: No orthopedic issues PAIN:  Are you having pain? Yes: NPRS scale: 8/10 Pain location: medial knee joint Pain description: aching; stabbing when increases Aggravating factors: cold weather; unknown, sitting too long Relieving factors: unknown  PRECAUTIONS: None  WEIGHT BEARING RESTRICTIONS: No  FALLS:  Has patient fallen in last 6 months? No  LIVING ENVIRONMENT: Lives with: lives with their family Lives in: House/apartment Stairs: Yes: External: 10 steps; on left going up Has following equipment at home: None  OCCUPATION: not working currently  PLOF: Independent  PATIENT GOALS: my knee to get well  NEXT MD VISIT: 01/28/2023 @ 9:00 am  OBJECTIVE:   DIAGNOSTIC FINDINGS: X-ray report  Chief complaint medial pain left knee No trauma  Images AP both knees lateral and sunrise view left knee  Reading: Normal alignment normal joint spaces no  fracture normal bone quality no bone lesions no effusion  Impression: Normal knee  PATIENT SURVEYS:  FOTO 41  COGNITION: Overall cognitive status: Within functional limits for tasks assessed     SENSATION: N/T occassionally medial left knee  EDEMA:  Mild effusion noted medial left knee   POSTURE: weight shift right  PALPATION: Point tender pes anserine bursa; swelling noted medial knee and hamstring  LOWER EXTREMITY ROM:  Active ROM Right eval Left eval  Left 01/30/23  Hip flexion     Hip extension     Hip abduction     Hip adduction     Hip internal rotation     Hip external rotation     Knee flexion 136 62 130  Knee extension 0 -10 -10  Ankle dorsiflexion     Ankle plantarflexion     Ankle inversion     Ankle eversion      (Blank rows = not tested)  LOWER EXTREMITY NUU:VOZDGUY with all left leg movement  MMT Right eval Left eval Left 01/30/23  Hip flexion  3 4+  Hip extension  3-   Hip abduction     Hip adduction     Hip internal rotation     Hip external rotation     Knee flexion  3-   Knee extension  3- 4*  Ankle dorsiflexion  4- 4+  Ankle plantarflexion     Ankle inversion     Ankle eversion      (Blank rows = not tested)   FUNCTIONAL TESTS:  5 times sit to stand: 23.54 sec no UE assist  GAIT: Distance walked: 50 ft Assistive device utilized: None Level of assistance: Modified independence Comments: antalgic gait; decreased stance left lower extremity   TODAY'S TREATMENT:                                                                                                                              DATE:  01/30/23 Review of HEP and goals Progress note FOTO 54 5 times sit to stand 10.81 sec 2 MWT 389 ft AROM and MMTs see above  Supine: Left quad set 5" hold x 10 Active hamstring stretch with towel 5 x 10" hold  Seated: Heel slides x 10 Heel toe raises x 20 LAQs 2 x   Standing: Heel raises x 10       01/09/23 physical therapy evaluation and HEP instruction    PATIENT EDUCATION:  Education details: Patient educated on exam findings, POC, scope of PT, HEP, and on what to expect next visit. Person educated: Patient Education method: Explanation, Demonstration, and Handouts Education comprehension: verbalized understanding, returned demonstration, verbal cues required, and tactile cues required   HOME EXERCISE PROGRAM: 01/30/23 quad set and hamstring stretch  Access Code: [email or web address] URL:  [email or web address] Date: 01/09/2023 Prepared by: AP - Rehab  Exercises - Seated Heel Slide  - 2 x daily - 7 x weekly - 1 sets - 10  reps - Seated Heel Toe Raises  - 2 x daily - 7 x weekly - 1 sets - 10 reps - Supine Hip Internal and External Rotation  - 2 x daily - 7 x weekly - 1 sets - 10 reps  ASSESSMENT:  CLINICAL IMPRESSION: Today's session started with a review of HEP and goals.  Progress note.  Patient has made good improvement with mobility and strength; continues with pain and limited knee extension.  Progressed HEP today; patient continues with pain with knee extension.   Met 1 STG and 2 LTG's today.  Patient will benefit from continued skilled therapy services to address deficits and promote return to optimal function.       Eval:Patient is a 37 y.o. female who was seen today for physical therapy evaluation and treatment for left knee pain. Patient presents to physical therapy with complaint of left knee pain and swelling. Patient demonstrates muscle weakness, reduced ROM, and fascial restrictions which are likely contributing to symptoms of pain and are negatively impacting patient ability to perform ADLs and functional mobility tasks. Patient will benefit from skilled physical therapy services to address these deficits to reduce pain and improve level of function with ADLs and functional mobility tasks.   OBJECTIVE IMPAIRMENTS: Abnormal gait, decreased activity tolerance, decreased knowledge of condition, decreased knowledge of use of DME, decreased mobility, difficulty walking, decreased ROM, decreased strength, hypomobility, increased edema, increased fascial restrictions, impaired perceived functional ability, impaired flexibility, and pain.   ACTIVITY LIMITATIONS: bending, sitting, standing, squatting, sleeping, stairs, transfers, bed mobility, bathing, toileting, dressing, locomotion level, and caring for others  PARTICIPATION LIMITATIONS: meal prep, cleaning,  laundry, shopping, and community activity    REHAB POTENTIAL: Good  CLINICAL DECISION MAKING: Stable/uncomplicated  EVALUATION COMPLEXITY: Low   GOALS: Goals reviewed with patient? No  SHORT TERM GOALS: Target date: 01/23/2023 patient will be independent with initial HEP  Baseline: Goal status: IN PROGRESS  2.  Patient will self report 30% improvement to improve tolerance for functional activity    Baseline: about 50% better 01/30/23 Goal status: MET   LONG TERM GOALS: Target date: 03/09/2023  Patient will be independent in self management strategies to improve quality of life and functional outcomes.  Baseline:  Goal status: IN PROGRESS  2.  Patient will improve FOTO score to predicted value (67)   Baseline: 01/30/23 54  Goal status: IN PROGRESS  3.  Patient will increase  leg MMTs to 5/5 without pain to promote return to ambulation community distances with minimal deviation.  Baseline:  Goal status: IN PROGRESS  4.  Patient will self report 50% improvement to improve tolerance for functional activity  Baseline: 01/30/23 50% better Goal status: MET  5.  Patient will improve 5 times sit to stand score from 23.54 sec to 15 sec to demonstrate improved functional mobility and increased lower extremity strength.  Baseline: 10.81 sec 01/30/23 Goal status: MET   PLAN:  PT FREQUENCY: 2x/week  PT DURATION: 3 weeks  PLANNED INTERVENTIONS: Therapeutic exercises, Therapeutic activity, Neuromuscular re-education, Balance training, Gait training, Patient/Family education, Joint manipulation, Joint mobilization, Stair training, Orthotic/Fit training, DME instructions, Aquatic Therapy, Dry Needling, Electrical stimulation, Spinal manipulation, Spinal mobilization, Cryotherapy, Moist heat, Compression bandaging, scar mobilization, Splintting, Taping, Traction, Ultrasound, Ionotophoresis 4mg /ml Dexamethasone, and Manual therapy   PLAN FOR NEXT SESSION: progress knee mobility  and strength as able; bike; progress  standing exercise as able; TKE; slant board; MRI 2/27   10:25 AM, 01/30/23 Jaclyn Howe MPT [facility] [place] [phone]

## 2023-02-01 ENCOUNTER — Encounter: Payer: Self-pay | Admitting: Family Medicine

## 2023-02-01 ENCOUNTER — Encounter (HOSPITAL_COMMUNITY): Payer: Medicaid Other

## 2023-02-01 ENCOUNTER — Ambulatory Visit (INDEPENDENT_AMBULATORY_CARE_PROVIDER_SITE_OTHER): Payer: Commercial Managed Care - HMO | Admitting: Family Medicine

## 2023-02-01 VITALS — BP 111/74 | HR 74 | Resp 16 | Ht 61.0 in | Wt 113.0 lb

## 2023-02-01 DIAGNOSIS — E559 Vitamin D deficiency, unspecified: Secondary | ICD-10-CM | POA: Diagnosis not present

## 2023-02-01 DIAGNOSIS — M79605 Pain in left leg: Secondary | ICD-10-CM | POA: Diagnosis not present

## 2023-02-01 DIAGNOSIS — I10 Essential (primary) hypertension: Secondary | ICD-10-CM | POA: Diagnosis not present

## 2023-02-01 DIAGNOSIS — E038 Other specified hypothyroidism: Secondary | ICD-10-CM | POA: Diagnosis not present

## 2023-02-01 DIAGNOSIS — E7849 Other hyperlipidemia: Secondary | ICD-10-CM

## 2023-02-01 DIAGNOSIS — R7301 Impaired fasting glucose: Secondary | ICD-10-CM | POA: Diagnosis not present

## 2023-02-01 NOTE — Patient Instructions (Signed)
I appreciate the opportunity to provide care to you today!    Follow up:  3 months  Labs: please stop by the lab today to get your blood drawn (CBC, CMP, TSH, Lipid profile, HgA1c, Vit D)    Please continue to a heart-healthy diet and increase your physical activities. Try to exercise for 23mns at least five times a week.   Physical activity helps: Lower your blood glucose, improve your heart health, lower your blood pressure and cholesterol, burn calories to help manage her weight, gave you energy, lower stress, and improve his sleep.  The American diabetes Association (ADA) recommends being active for 2-1/2 hours (150 minutes) or more week.  Exercise for 30 minutes, 5 days a week (150 minutes total)      It was a pleasure to see you and I look forward to continuing to work together on your health and well-being. Please do not hesitate to call the office if you need care or have questions about your care.   Have a wonderful day and week. With Gratitude, GAlvira MondayMSN, FNP-BC

## 2023-02-01 NOTE — Assessment & Plan Note (Signed)
Controlled She takes amlodipine 5 mg daily Denies headaches, dizziness, blurred vision, chest pain, palpitation She will compliance with treatment regimen Encouraged to continue low-sodium diet with increased physical activity BP Readings from Last 3 Encounters:  02/01/23 111/74  01/23/23 127/87  12/27/22 (!) 136/90

## 2023-02-01 NOTE — Assessment & Plan Note (Signed)
She is following up with orthopedics with a scheduled MRI on 02/19/2023

## 2023-02-01 NOTE — Progress Notes (Signed)
Established Patient Office Visit  Subjective:  Patient ID: Jaclyn Howe, female    DOB: 02/04/1986  Age: 37 y.o. MRN: [id number]  CC:  Chief Complaint  Patient presents with   Hypertension    Follow up visit     HPI Jaclyn Howe is a 37 y.o. female with past medical history of essential hypertension, tobacco use, and vitamin D deficiency presents for f/u of  chronic medical conditions. For the details of today's visit, please refer to the assessment and plan.     Past Medical History:  Diagnosis Date   Allergic rhinitis 06/22/2016   Chronic abdominal pain    Chronic headache    Epigastric pain 07/07/2018   Hypertension    Migraine headache 07/13/2008   Qualifier: Diagnosis of  By: Dierdre Harness     Nausea and vomiting    Recurrent    Past Surgical History:  Procedure Laterality Date   ELECTIVE ABORTION      No family history on file.  Social History   Socioeconomic History   Marital status: Single    Spouse name: Not on file   Number of children: 1   Years of education: Not on file   Highest education level: High school graduate  Occupational History   Not on file  Tobacco Use   Smoking status: Every Day    Packs/day: 1.00    Years: 3.00    Total pack years: 3.00    Types: Cigarettes    Start date: 09/21/2001   Smokeless tobacco: Never  Vaping Use   Vaping Use: Never used  Substance and Sexual Activity   Alcohol use: Not Currently    Comment: 32 ounces beer and half pint liquor   Drug use: Not Currently    Types: Marijuana   Sexual activity: Not Currently    Birth control/protection: Injection  Other Topics Concern   Not on file  Social History Narrative   Lives with Ria Clock son 9 years old      Dog: [place]       Enjoy: playing cards, hanging out with friends      Diet: eats all food groups   Caffeine: soda- 2-3 16 oz bottles   Water: 1 bottle a daily       Wear seat belt   Smoke detectors at home   [employer]   No weapons      Social Determinants of Health   Financial Resource Strain: Low Risk  (01/23/2023)   Overall Financial Resource Strain (CARDIA)    Difficulty of Paying Living Expenses: Not hard at all  Food Insecurity: Food Insecurity Present (01/23/2023)   Hunger Vital Sign    Worried About [place] in the Last Year: Sometimes true    Ran Out of Food in the Last Year: Never true  Transportation Needs: No Transportation Needs (01/23/2023)   PRAPARE - Hydrologist (Medical): No    Lack of Transportation (Non-Medical): No  Physical Activity: Insufficiently Active (01/23/2023)   Exercise Vital Sign    Days of Exercise per Week: 2 days    Minutes of Exercise per Session: 20 min  Stress: No Stress Concern Present (01/23/2023)   [place]    Feeling of Stress : Not at all  Social Connections: Socially Isolated (01/23/2023)   Social Connection and Isolation Panel [NHANES]    Frequency of Communication with Friends and Family: Twice a week  Frequency of Social Gatherings with Friends and Family: More than three times a week    Attends Religious Services: Never    Marine scientist or Organizations: No    Attends Archivist Meetings: Never    Marital Status: Never married  Intimate Partner Violence: Not At Risk (01/23/2023)   Humiliation, Afraid, Rape, and Kick questionnaire    Fear of Current or Ex-Partner: No    Emotionally Abused: No    Physically Abused: No    Sexually Abused: No    Outpatient Medications Prior to Visit  Medication Sig Dispense Refill   amLODipine (NORVASC) 5 MG tablet TAKE 1 TABLET BY MOUTH ONCE DAILY. 90 tablet 0   ibuprofen (ADVIL) 800 MG tablet Take 1 tablet (800 mg total) by mouth every 8 (eight) hours as needed. 90 tablet 1   medroxyPROGESTERone Acetate 150 MG/ML SUSY Inject 1 ML IM once every 3 months in office 1 mL 4   Vitamin D, Ergocalciferol,  (DRISDOL) 1.25 MG (50000 UNIT) CAPS capsule Take 1 capsule (50,000 Units total) by mouth every 7 (seven) days. 8 capsule 0   No facility-administered medications prior to visit.    No Known Allergies  ROS Review of Systems  Constitutional:  Negative for chills and fever.  Eyes:  Negative for visual disturbance.  Respiratory:  Negative for chest tightness and shortness of breath.   Neurological:  Negative for dizziness and headaches.      Objective:    Physical Exam HENT:     Head: Normocephalic.     Mouth/Throat:     Mouth: Mucous membranes are moist.  Cardiovascular:     Rate and Rhythm: Normal rate.     Heart sounds: Normal heart sounds.  Pulmonary:     Effort: Pulmonary effort is normal.     Breath sounds: Normal breath sounds.  Neurological:     Mental Status: She is alert.     BP 111/74   Pulse 74   Resp 16   Ht 5' 1"$  (1.549 m)   Wt 113 lb (51.3 kg)   SpO2 98%   BMI 21.35 kg/m  Wt Readings from Last 3 Encounters:  02/01/23 113 lb (51.3 kg)  01/23/23 114 lb 8 oz (51.9 kg)  12/27/22 107 lb (48.5 kg)    Lab Results  Component Value Date   TSH 1.100 11/05/2022   Lab Results  Component Value Date   WBC 5.7 11/05/2022   HGB 15.2 11/05/2022   HCT 45.3 11/05/2022   MCV 100 (H) 11/05/2022   PLT 377 11/05/2022   Lab Results  Component Value Date   NA 138 11/05/2022   K 4.3 11/05/2022   CO2 19 (L) 11/05/2022   GLUCOSE 88 11/05/2022   BUN 7 11/05/2022   CREATININE 0.76 11/05/2022   BILITOT 0.4 11/05/2022   ALKPHOS 91 11/05/2022   AST 23 11/05/2022   ALT 20 11/05/2022   PROT 7.2 11/05/2022   ALBUMIN 4.5 11/05/2022   CALCIUM 9.6 11/05/2022   ANIONGAP 9 10/08/2021   EGFR 103 11/05/2022   Lab Results  Component Value Date   CHOL 208 (H) 11/05/2022   Lab Results  Component Value Date   HDL 63 11/05/2022   Lab Results  Component Value Date   LDLCALC 91 11/05/2022   Lab Results  Component Value Date   TRIG 328 (H) 11/05/2022   Lab  Results  Component Value Date   CHOLHDL 3.3 11/05/2022   Lab Results  Component Value  Date   HGBA1C 5.1 11/05/2022      Assessment & Plan:  Essential hypertension Assessment & Plan: Controlled She takes amlodipine 5 mg daily Denies headaches, dizziness, blurred vision, chest pain, palpitation She will compliance with treatment regimen Encouraged to continue low-sodium diet with increased physical activity BP Readings from Last 3 Encounters:  02/01/23 111/74  01/23/23 127/87  12/27/22 (!) 136/90     Orders: -     CMP14+EGFR -     CBC with Differential/Platelet  Left leg pain Assessment & Plan: She is following up with orthopedics with a scheduled MRI on 02/19/2023   IFG (impaired fasting glucose) -     Hemoglobin A1c  Vitamin D deficiency -     VITAMIN D 25 Hydroxy (Vit-D Deficiency, Fractures)  Other specified hypothyroidism -     TSH + free T4  Other hyperlipidemia -     Lipid panel    Follow-up: Return in about 3 months (around 05/02/2023).   Alvira Monday, FNP

## 2023-02-04 ENCOUNTER — Telehealth (HOSPITAL_COMMUNITY): Payer: Self-pay | Admitting: Physical Therapy

## 2023-02-04 ENCOUNTER — Encounter (HOSPITAL_COMMUNITY): Payer: Medicaid Other | Admitting: Physical Therapy

## 2023-02-04 NOTE — Telephone Encounter (Signed)
Pt did not show for appt, second NS today.  Unable to reach by given number.  Teena Irani, PTA/CLT [place] Ph: [phone]

## 2023-02-07 ENCOUNTER — Encounter (HOSPITAL_COMMUNITY): Payer: Medicaid Other

## 2023-02-07 ENCOUNTER — Encounter (HOSPITAL_COMMUNITY): Payer: Self-pay

## 2023-02-07 NOTE — Therapy (Signed)
[facility] at [place], [place], [address] Phone: [phone]   Fax:  [phone]  Patient Details  Name: Jaclyn Howe MRN: [id number] Date of Birth: June 19, 1986 Referring Provider:  No ref. provider found  Encounter Date: 02/07/2023  PT called to inform pt of no show. Pt answered and reported trying to call over to cancel appointment due to unexpected appointment at son's school. Pt was educated to call back main office to schedule another appointment for re-assessment.   Wonda Olds, PT 02/07/2023, 10:03 AM  [facility] at [place], [place], [address] Phone: [phone]   Fax:  [phone]

## 2023-02-19 ENCOUNTER — Ambulatory Visit (HOSPITAL_COMMUNITY): Payer: Commercial Managed Care - HMO

## 2023-02-19 ENCOUNTER — Telehealth: Payer: Self-pay | Admitting: Orthopedic Surgery

## 2023-02-19 DIAGNOSIS — M23322 Other meniscus derangements, posterior horn of medial meniscus, left knee: Secondary | ICD-10-CM

## 2023-02-19 DIAGNOSIS — G8929 Other chronic pain: Secondary | ICD-10-CM

## 2023-02-19 NOTE — Telephone Encounter (Signed)
Patient presented to the office stating she went of her MRI this morning at [facility] and they told her she needs a prior autho.  I have cancelled her f/u for the MRI review until resolved.  Pt's # [phone]

## 2023-02-19 NOTE — Telephone Encounter (Signed)
Imaging requires six weeks of provider directed treatment to be completed. Supported treatments include (but are not limited to) drugs for swelling or pain, an in office workout (physical therapy), and/or oral or injected steroids. This must have been completed in the past three months without improved symptoms. Contact (via office visit, phone, email, or messaging) must occur after the treatment is completed. This has not been met because:  You have not completed six weeks of provider directed treatment.  Symptoms must be the same or worse after treatment to support imaging.  There was no contact with your provider after completing treatment.  Based on [employer] Musculoskeletal Imaging Guidelines Section(s): Knee (MS 25) and 1.0 General Guideline   Called patient to advise she has to have PT / sent order.

## 2023-02-19 NOTE — Addendum Note (Signed)
Addended byCandice Camp on: 02/19/2023 11:43 AM   Modules accepted: Orders

## 2023-02-19 NOTE — Telephone Encounter (Signed)
Left message for her to call back

## 2023-02-19 NOTE — Telephone Encounter (Signed)
Jaclyn Howe is pending, [name] spoke to her.

## 2023-02-21 ENCOUNTER — Encounter: Payer: Self-pay | Admitting: Radiology

## 2023-02-21 NOTE — Telephone Encounter (Signed)
Alld her we discussed she voiced understanding

## 2023-02-25 ENCOUNTER — Ambulatory Visit: Payer: Medicaid Other | Admitting: Orthopedic Surgery

## 2023-02-26 ENCOUNTER — Encounter (HOSPITAL_COMMUNITY): Payer: Self-pay

## 2023-02-26 NOTE — Therapy (Signed)
[facility] at [place], [place], [address] Phone: [phone]   Fax:  [phone]  Patient Details  Name: Jaclyn Howe MRN: [id number] Date of Birth: Apr 02, 1986 Referring Provider:  No ref. provider found  Encounter Date: 02/26/2023 PHYSICAL THERAPY DISCHARGE SUMMARY  Visits from Start of Care: 2  Current functional level related to goals / functional outcomes: Unknown as pt has not returned since last visit.   Remaining deficits: Unknown.    Education / Equipment: N/A   Patient agrees to discharge. Patient goals were not met. Patient is being discharged due to not returning since the last visit. New PT order placed, discharging current PT order for new order.   Wonda Olds, PT 02/26/2023, 1:00 PM  [facility] at [place], [place], [address] Phone: [phone]   Fax:  [phone]

## 2023-03-13 ENCOUNTER — Other Ambulatory Visit: Payer: Self-pay

## 2023-03-13 ENCOUNTER — Telehealth: Payer: Self-pay | Admitting: Family Medicine

## 2023-03-13 DIAGNOSIS — I10 Essential (primary) hypertension: Secondary | ICD-10-CM

## 2023-03-13 MED ORDER — AMLODIPINE BESYLATE 5 MG PO TABS: 5.0000 mg | ORAL_TABLET | Freq: Every day | ORAL | 0 refills | Status: DC

## 2023-03-13 NOTE — Telephone Encounter (Signed)
Prescription Request  03/13/2023  LOV: 02/01/2023  What is the name of the medication or equipment? amLODipine (NORVASC) 5 MG tablet   Have you contacted your pharmacy to request a refill? Yes   Which pharmacy would you like this sent to?  [facility], [place] [address] Phone: [phone] Fax: [phone]    Patient notified that their request is being sent to the clinical staff for review and that they should receive a response within 2 business days.   Please advise at [facility] [phone]

## 2023-03-13 NOTE — Telephone Encounter (Signed)
Med refilled.

## 2023-03-18 ENCOUNTER — Ambulatory Visit (INDEPENDENT_AMBULATORY_CARE_PROVIDER_SITE_OTHER): Payer: Medicaid Other | Admitting: *Deleted

## 2023-03-18 DIAGNOSIS — Z3042 Encounter for surveillance of injectable contraceptive: Secondary | ICD-10-CM | POA: Diagnosis not present

## 2023-03-18 MED ORDER — MEDROXYPROGESTERONE ACETATE 150 MG/ML IM SUSY
150.0000 mg | PREFILLED_SYRINGE | Freq: Once | INTRAMUSCULAR | Status: AC
  Administered 2023-03-18: 150 mg via INTRAMUSCULAR

## 2023-03-18 NOTE — Progress Notes (Signed)
   NURSE VISIT- INJECTION  SUBJECTIVE:  Jaclyn Howe is a 37 y.o. G31P1001 female here for a Depo Provera for contraception/period management. She is a GYN patient.   OBJECTIVE:  There were no vitals taken for this visit.  Appears well, in no apparent distress  Injection administered in: Left deltoid  Meds ordered this encounter  Medications   medroxyPROGESTERone Acetate SUSY 150 mg    ASSESSMENT: GYN patient Depo Provera for contraception/period management PLAN: Follow-up: in 11-13 weeks for next Depo   Levy Pupa  03/18/2023 9:27 AM

## 2023-03-28 ENCOUNTER — Ambulatory Visit (HOSPITAL_COMMUNITY): Payer: Medicaid Other | Attending: Family Medicine

## 2023-04-19 DIAGNOSIS — E8721 Acute metabolic acidosis: Secondary | ICD-10-CM | POA: Diagnosis not present

## 2023-04-19 DIAGNOSIS — Z716 Tobacco abuse counseling: Secondary | ICD-10-CM | POA: Diagnosis not present

## 2023-04-19 DIAGNOSIS — I129 Hypertensive chronic kidney disease with stage 1 through stage 4 chronic kidney disease, or unspecified chronic kidney disease: Secondary | ICD-10-CM | POA: Diagnosis not present

## 2023-04-19 DIAGNOSIS — E559 Vitamin D deficiency, unspecified: Secondary | ICD-10-CM | POA: Diagnosis not present

## 2023-04-19 DIAGNOSIS — N181 Chronic kidney disease, stage 1: Secondary | ICD-10-CM | POA: Diagnosis not present

## 2023-04-19 DIAGNOSIS — R809 Proteinuria, unspecified: Secondary | ICD-10-CM | POA: Diagnosis not present

## 2023-04-19 DIAGNOSIS — R319 Hematuria, unspecified: Secondary | ICD-10-CM | POA: Diagnosis not present

## 2023-04-24 DIAGNOSIS — I129 Hypertensive chronic kidney disease with stage 1 through stage 4 chronic kidney disease, or unspecified chronic kidney disease: Secondary | ICD-10-CM | POA: Diagnosis not present

## 2023-04-24 DIAGNOSIS — E559 Vitamin D deficiency, unspecified: Secondary | ICD-10-CM | POA: Diagnosis not present

## 2023-05-02 ENCOUNTER — Ambulatory Visit (INDEPENDENT_AMBULATORY_CARE_PROVIDER_SITE_OTHER): Payer: Medicaid Other | Admitting: Family Medicine

## 2023-05-02 ENCOUNTER — Encounter: Payer: Self-pay | Admitting: Family Medicine

## 2023-05-02 VITALS — BP 120/83 | HR 83 | Ht 61.0 in | Wt 112.1 lb

## 2023-05-02 DIAGNOSIS — E7849 Other hyperlipidemia: Secondary | ICD-10-CM | POA: Diagnosis not present

## 2023-05-02 DIAGNOSIS — R7301 Impaired fasting glucose: Secondary | ICD-10-CM

## 2023-05-02 DIAGNOSIS — E559 Vitamin D deficiency, unspecified: Secondary | ICD-10-CM | POA: Diagnosis not present

## 2023-05-02 DIAGNOSIS — I10 Essential (primary) hypertension: Secondary | ICD-10-CM

## 2023-05-02 DIAGNOSIS — E038 Other specified hypothyroidism: Secondary | ICD-10-CM | POA: Diagnosis not present

## 2023-05-02 DIAGNOSIS — Z23 Encounter for immunization: Secondary | ICD-10-CM

## 2023-05-02 MED ORDER — AMLODIPINE BESYLATE 5 MG PO TABS
5.0000 mg | ORAL_TABLET | Freq: Every day | ORAL | 2 refills | Status: DC
Start: 2023-05-02 — End: 2024-03-10

## 2023-05-02 NOTE — Progress Notes (Signed)
Established Patient Office Visit  Subjective:  Patient ID: Jaclyn Howe, female    DOB: September 01, 1986  Age: 37 y.o. MRN: [id number]  CC:  Chief Complaint  Patient presents with   Chronic Care Management    3 month f/u, pt is fasting this morning will do labs that were ordered on 02/01/2023 today.     HPI Jaclyn Howe is a 37 y.o. female with past medical history of essential hypertension presents for f/u. For the details of today's visit, please refer to the assessment and plan.    Past Medical History:  Diagnosis Date   Allergic rhinitis 06/22/2016   Chronic abdominal pain    Chronic headache    Epigastric pain 07/07/2018   Hypertension    Migraine headache 07/13/2008   Qualifier: Diagnosis of  By: Lind Guest     Nausea and vomiting    Recurrent    Past Surgical History:  Procedure Laterality Date   ELECTIVE ABORTION      History reviewed. No pertinent family history.  Social History   Socioeconomic History   Marital status: Single    Spouse name: Not on file   Number of children: 1   Years of education: Not on file   Highest education level: High school graduate  Occupational History   Not on file  Tobacco Use   Smoking status: Every Day    Packs/day: 1.00    Years: 3.00    Additional pack years: 0.00    Total pack years: 3.00    Types: Cigarettes    Start date: 09/21/2001   Smokeless tobacco: Never  Vaping Use   Vaping Use: Never used  Substance and Sexual Activity   Alcohol use: Not Currently    Comment: 32 ounces beer and half pint liquor   Drug use: Not Currently    Types: Marijuana   Sexual activity: Not Currently    Birth control/protection: Injection  Other Topics Concern   Not on file  Social History Narrative   Lives with Harlow Ohms son 67 years old      Dog: [place]       Enjoy: playing cards, hanging out with friends      Diet: eats all food groups   Caffeine: soda- 2-3 16 oz bottles   Water: 1 bottle a daily       Wear seat  belt   Smoke detectors at home   [employer]   No weapons     Social Determinants of Health   Financial Resource Strain: Low Risk  (01/23/2023)   Overall Financial Resource Strain (CARDIA)    Difficulty of Paying Living Expenses: Not hard at all  Food Insecurity: Food Insecurity Present (01/23/2023)   Hunger Vital Sign    Worried About Running Out of Food in the Last Year: Sometimes true    Ran Out of Food in the Last Year: Never true  Transportation Needs: No Transportation Needs (01/23/2023)   PRAPARE - Administrator, Civil Service (Medical): No    Lack of Transportation (Non-Medical): No  Physical Activity: Insufficiently Active (01/23/2023)   Exercise Vital Sign    Days of Exercise per Week: 2 days    Minutes of Exercise per Session: 20 min  Stress: No Stress Concern Present (01/23/2023)   [employer] of Occupational Health - Occupational Stress Questionnaire    Feeling of Stress : Not at all  Social Connections: Socially Isolated (01/23/2023)   Social Connection and Isolation Panel [NHANES]  Frequency of Communication with Friends and Family: Twice a week    Frequency of Social Gatherings with Friends and Family: More than three times a week    Attends Religious Services: Never    Database administrator or Organizations: No    Attends Banker Meetings: Never    Marital Status: Never married  Intimate Partner Violence: Not At Risk (01/23/2023)   Humiliation, Afraid, Rape, and Kick questionnaire    Fear of Current or Ex-Partner: No    Emotionally Abused: No    Physically Abused: No    Sexually Abused: No    Outpatient Medications Prior to Visit  Medication Sig Dispense Refill   ibuprofen (ADVIL) 800 MG tablet Take 1 tablet (800 mg total) by mouth every 8 (eight) hours as needed. 90 tablet 1   medroxyPROGESTERone Acetate 150 MG/ML SUSY Inject 1 ML IM once every 3 months in office 1 mL 4   amLODipine (NORVASC) 5 MG tablet Take 1 tablet (5  mg total) by mouth daily. 90 tablet 0   No facility-administered medications prior to visit.    No Known Allergies  ROS Review of Systems  Constitutional:  Negative for chills and fever.  Eyes:  Negative for visual disturbance.  Respiratory:  Negative for chest tightness and shortness of breath.   Neurological:  Negative for dizziness and headaches.      Objective:    Physical Exam HENT:     Head: Normocephalic.     Mouth/Throat:     Mouth: Mucous membranes are moist.  Cardiovascular:     Rate and Rhythm: Normal rate.     Heart sounds: Normal heart sounds.  Pulmonary:     Effort: Pulmonary effort is normal.     Breath sounds: Normal breath sounds.  Neurological:     Mental Status: She is alert.     BP 120/83   Pulse 83   Ht 5\' 1"  (1.549 m)   Wt 112 lb 1.3 oz (50.8 kg)   SpO2 98%   BMI 21.18 kg/m  Wt Readings from Last 3 Encounters:  05/02/23 112 lb 1.3 oz (50.8 kg)  02/01/23 113 lb (51.3 kg)  01/23/23 114 lb 8 oz (51.9 kg)    Lab Results  Component Value Date   TSH 0.777 05/02/2023   Lab Results  Component Value Date   WBC 6.9 05/02/2023   HGB 14.7 05/02/2023   HCT 44.2 05/02/2023   MCV 100 (H) 05/02/2023   PLT 324 05/02/2023   Lab Results  Component Value Date   NA 139 05/02/2023   K 4.3 05/02/2023   CO2 20 05/02/2023   GLUCOSE 91 05/02/2023   BUN 8 05/02/2023   CREATININE 0.76 05/02/2023   BILITOT 0.7 05/02/2023   ALKPHOS 90 05/02/2023   AST 22 05/02/2023   ALT 18 05/02/2023   PROT 7.2 05/02/2023   ALBUMIN 4.6 05/02/2023   CALCIUM 9.6 05/02/2023   ANIONGAP 9 10/08/2021   EGFR 103 05/02/2023   Lab Results  Component Value Date   CHOL 251 (H) 05/02/2023   Lab Results  Component Value Date   HDL 73 05/02/2023   Lab Results  Component Value Date   LDLCALC 150 (H) 05/02/2023   Lab Results  Component Value Date   TRIG 159 (H) 05/02/2023   Lab Results  Component Value Date   CHOLHDL 3.4 05/02/2023   Lab Results  Component  Value Date   HGBA1C 5.4 05/02/2023      Assessment &  Plan:  Essential hypertension Assessment & Plan: Controlled She takes amlodipine 5 mg daily Denies headaches, dizziness, blurred vision, chest pain, palpitation She will compliance with treatment regimen Encouraged to continue low-sodium diet with increased physical activity BP Readings from Last 3 Encounters:  05/02/23 120/83  02/01/23 111/74  01/23/23 127/87     Orders: -     amLODIPine Besylate; Take 1 tablet (5 mg total) by mouth daily.  Dispense: 90 tablet; Refill: 2  Immunization due -     HPV 9-valent vaccine,Recombinat -     Tdap vaccine greater than or equal to 7yo IM  IFG (impaired fasting glucose) -     Hemoglobin A1c  Vitamin D deficiency -     VITAMIN D 25 Hydroxy (Vit-D Deficiency, Fractures)  Other specified hypothyroidism -     TSH + free T4  Other hyperlipidemia -     Lipid panel -     CMP14+EGFR -     CBC with Differential/Platelet    Follow-up: Return in about 4 months (around 09/02/2023).   Gilmore Laroche, FNP

## 2023-05-02 NOTE — Patient Instructions (Signed)
I appreciate the opportunity to provide care to you today!    Follow up:  4 months  Labs: please stop by the lab today to get your blood drawn (CBC, CMP, TSH, Lipid profile, HgA1c, Vit D)   Smoking is harmful to your health and increases your risk for cancer, COPD, high blood pressure, cataracts, digestive problems, or health problems , such as gum disease, mouth sores, and tooth loss and loss of taste and smell. Smoking irritates your throat and causes coughing.    Please continue to a heart-healthy diet and increase your physical activities. Try to exercise for at least five days a week.      It was a pleasure to see you and I look forward to continuing to work together on your health and well-being. Please do not hesitate to call the office if you need care or have questions about your care.   Have a wonderful day and week. With Gratitude, Gilmore Laroche MSN, FNP-BC

## 2023-05-03 LAB — CMP14+EGFR
ALT: 18 IU/L (ref 0–32)
AST: 22 IU/L (ref 0–40)
Albumin/Globulin Ratio: 1.8 (ref 1.2–2.2)
Albumin: 4.6 g/dL (ref 3.9–4.9)
Alkaline Phosphatase: 90 IU/L (ref 44–121)
BUN/Creatinine Ratio: 11 (ref 9–23)
BUN: 8 mg/dL (ref 6–20)
Bilirubin Total: 0.7 mg/dL (ref 0.0–1.2)
CO2: 20 mmol/L (ref 20–29)
Calcium: 9.6 mg/dL (ref 8.7–10.2)
Chloride: 103 mmol/L (ref 96–106)
Creatinine, Ser: 0.76 mg/dL (ref 0.57–1.00)
Globulin, Total: 2.6 g/dL (ref 1.5–4.5)
Glucose: 91 mg/dL (ref 70–99)
Potassium: 4.3 mmol/L (ref 3.5–5.2)
Sodium: 139 mmol/L (ref 134–144)
Total Protein: 7.2 g/dL (ref 6.0–8.5)
eGFR: 103 mL/min/{1.73_m2} (ref >59)

## 2023-05-03 LAB — VITAMIN D 25 HYDROXY (VIT D DEFICIENCY, FRACTURES): Vit D, 25-Hydroxy: 20.6 ng/mL — ABNORMAL LOW (ref 30.0–100.0)

## 2023-05-03 LAB — CBC WITH DIFFERENTIAL/PLATELET
Basophils Absolute: 0 10*3/uL (ref 0.0–0.2)
Basos: 0 %
EOS (ABSOLUTE): 0.1 10*3/uL (ref 0.0–0.4)
Eos: 1 %
Hematocrit: 44.2 % (ref 34.0–46.6)
Hemoglobin: 14.7 g/dL (ref 11.1–15.9)
Immature Grans (Abs): 0 10*3/uL (ref 0.0–0.1)
Immature Granulocytes: 0 %
Lymphocytes Absolute: 2 10*3/uL (ref 0.7–3.1)
Lymphs: 29 %
MCH: 33.3 pg — ABNORMAL HIGH (ref 26.6–33.0)
MCHC: 33.3 g/dL (ref 31.5–35.7)
MCV: 100 fL — ABNORMAL HIGH (ref 79–97)
Monocytes Absolute: 0.5 10*3/uL (ref 0.1–0.9)
Monocytes: 7 %
Neutrophils Absolute: 4.3 10*3/uL (ref 1.4–7.0)
Neutrophils: 63 %
Platelets: 324 10*3/uL (ref 150–450)
RBC: 4.42 x10E6/uL (ref 3.77–5.28)
RDW: 13.2 % (ref 11.7–15.4)
WBC: 6.9 10*3/uL (ref 3.4–10.8)

## 2023-05-03 LAB — LIPID PANEL
Chol/HDL Ratio: 3.4 ratio (ref 0.0–4.4)
Cholesterol, Total: 251 mg/dL — ABNORMAL HIGH (ref 100–199)
HDL: 73 mg/dL (ref >39)
LDL Chol Calc (NIH): 150 mg/dL — ABNORMAL HIGH (ref 0–99)
Triglycerides: 159 mg/dL — ABNORMAL HIGH (ref 0–149)
VLDL Cholesterol Cal: 28 mg/dL (ref 5–40)

## 2023-05-03 LAB — HEMOGLOBIN A1C
Est. average glucose Bld gHb Est-mCnc: 108 mg/dL
Hgb A1c MFr Bld: 5.4 % (ref 4.8–5.6)

## 2023-05-03 LAB — TSH+FREE T4
Free T4: 1.27 ng/dL (ref 0.82–1.77)
TSH: 0.777 u[IU]/mL (ref 0.450–4.500)

## 2023-05-03 NOTE — Assessment & Plan Note (Signed)
Controlled She takes amlodipine 5 mg daily Denies headaches, dizziness, blurred vision, chest pain, palpitation She will compliance with treatment regimen Encouraged to continue low-sodium diet with increased physical activity BP Readings from Last 3 Encounters:  05/02/23 120/83  02/01/23 111/74  01/23/23 127/87

## 2023-05-05 ENCOUNTER — Other Ambulatory Visit: Payer: Self-pay | Admitting: Family Medicine

## 2023-05-05 DIAGNOSIS — E559 Vitamin D deficiency, unspecified: Secondary | ICD-10-CM

## 2023-05-05 MED ORDER — VITAMIN D (ERGOCALCIFEROL) 1.25 MG (50000 UNIT) PO CAPS
50000.0000 [IU] | ORAL_CAPSULE | ORAL | 1 refills | Status: DC
Start: 2023-05-05 — End: 2024-03-16

## 2023-05-05 NOTE — Progress Notes (Signed)
The ASCVD Risk score (Arnett DK, et al., 2019) failed to calculate for the following reasons:    The 2019 ASCVD risk score is only valid for ages 40 to 79

## 2023-05-05 NOTE — Progress Notes (Signed)
Your LDL cholesterol is elevated.  I want your LDL cholesterol to be less than 100. I recommend avoiding simple carbohydrates, including cakes, sweet desserts, ice cream, soda (diet or regular), sweet tea, candies, chips, cookies, store-bought juices, alcohol in excess of 1-2 drinks a day, lemonade, artificial sweeteners, donuts, coffee creamers, and sugar-free products.  I recommend avoiding greasy, fatty foods with increased physical activity.  A prescription for weekly vitamin D supplement sent to your pharmacy, your vitamin D is slightly low.  All other labs are stable .

## 2023-06-10 ENCOUNTER — Ambulatory Visit (INDEPENDENT_AMBULATORY_CARE_PROVIDER_SITE_OTHER): Payer: Medicaid Other | Admitting: *Deleted

## 2023-06-10 DIAGNOSIS — Z3042 Encounter for surveillance of injectable contraceptive: Secondary | ICD-10-CM

## 2023-06-10 MED ORDER — MEDROXYPROGESTERONE ACETATE 150 MG/ML IM SUSY
150.0000 mg | PREFILLED_SYRINGE | Freq: Once | INTRAMUSCULAR | Status: AC
Start: 2023-06-10 — End: 2023-06-10
  Administered 2023-06-10: 150 mg via INTRAMUSCULAR

## 2023-06-10 NOTE — Progress Notes (Signed)
   NURSE VISIT- INJECTION  SUBJECTIVE:  Jaclyn Howe is a 37 y.o. G86P1001 female here for a Depo Provera for contraception/period management. She is a GYN patient.   OBJECTIVE:  There were no vitals taken for this visit.  Appears well, in no apparent distress  Injection administered in: Right deltoid  Meds ordered this encounter  Medications   medroxyPROGESTERone Acetate SUSY 150 mg    ASSESSMENT: GYN patient Depo Provera for contraception/period management PLAN: Follow-up: in 11-13 weeks for next Depo   Jobe Marker  06/10/2023 10:05 AM

## 2023-09-02 ENCOUNTER — Ambulatory Visit: Payer: Medicaid Other

## 2023-09-02 ENCOUNTER — Ambulatory Visit: Payer: Medicaid Other | Admitting: Family Medicine

## 2023-09-03 ENCOUNTER — Encounter: Payer: Self-pay | Admitting: Family Medicine

## 2023-09-11 ENCOUNTER — Ambulatory Visit (INDEPENDENT_AMBULATORY_CARE_PROVIDER_SITE_OTHER): Payer: Medicaid Other | Admitting: *Deleted

## 2023-09-11 DIAGNOSIS — Z3042 Encounter for surveillance of injectable contraceptive: Secondary | ICD-10-CM | POA: Diagnosis not present

## 2023-09-11 MED ORDER — MEDROXYPROGESTERONE ACETATE 150 MG/ML IM SUSY
150.0000 mg | PREFILLED_SYRINGE | Freq: Once | INTRAMUSCULAR | Status: AC
  Administered 2023-09-11: 150 mg via INTRAMUSCULAR

## 2023-09-11 NOTE — Progress Notes (Signed)
   NURSE VISIT- INJECTION  SUBJECTIVE:  Jaclyn Howe is a 37 y.o. G59P1001 female here for a Depo Provera for contraception/period management. She is a GYN patient.   OBJECTIVE:  There were no vitals taken for this visit.  Appears well, in no apparent distress  Injection administered in: Left deltoid  No orders of the defined types were placed in this encounter.   ASSESSMENT: GYN patient Depo Provera for contraception/period management PLAN: Follow-up: in 11-13 weeks for next Depo   Annamarie Dawley  09/11/2023 2:34 PM

## 2023-10-01 ENCOUNTER — Ambulatory Visit: Payer: Medicaid Other | Admitting: Family Medicine

## 2023-10-08 ENCOUNTER — Encounter: Payer: Self-pay | Admitting: Family Medicine

## 2023-10-22 ENCOUNTER — Encounter (HOSPITAL_COMMUNITY): Payer: Self-pay | Admitting: *Deleted

## 2023-10-22 ENCOUNTER — Other Ambulatory Visit: Payer: Self-pay

## 2023-10-22 ENCOUNTER — Emergency Department (HOSPITAL_COMMUNITY): Payer: Medicaid Other

## 2023-10-22 ENCOUNTER — Emergency Department (HOSPITAL_COMMUNITY)
Admission: EM | Admit: 2023-10-22 | Discharge: 2023-10-22 | Discharge status: Against Medical Advice | Payer: Medicaid Other | Attending: Emergency Medicine | Admitting: Emergency Medicine

## 2023-10-22 DIAGNOSIS — R310 Gross hematuria: Secondary | ICD-10-CM | POA: Diagnosis not present

## 2023-10-22 DIAGNOSIS — R109 Unspecified abdominal pain: Secondary | ICD-10-CM | POA: Insufficient documentation

## 2023-10-22 DIAGNOSIS — R319 Hematuria, unspecified: Secondary | ICD-10-CM | POA: Diagnosis present

## 2023-10-22 LAB — URINALYSIS, ROUTINE W REFLEX MICROSCOPIC
Bilirubin Urine: NEGATIVE
Glucose, UA: NEGATIVE mg/dL
Ketones, ur: NEGATIVE mg/dL
Nitrite: NEGATIVE
Protein, ur: 30 mg/dL — AB
Specific Gravity, Urine: 1.027 (ref 1.005–1.030)
pH: 5 (ref 5.0–8.0)

## 2023-10-22 LAB — PREGNANCY, URINE: Preg Test, Ur: NEGATIVE

## 2023-10-22 MED ORDER — KETOROLAC TROMETHAMINE 30 MG/ML IJ SOLN: 60.0000 mg | Freq: Once | INTRAMUSCULAR | Status: DC

## 2023-10-22 NOTE — ED Notes (Signed)
Pt not in room, pt not in bathroom, ct at bedside attempting to take pt.

## 2023-10-22 NOTE — ED Provider Notes (Signed)
[facility] EMERGENCY DEPARTMENT AT [facility] Provider Note   CSN: [id number] Arrival date & time: 10/22/23  6578     History {Add pertinent medical, surgical, social history, OB history to HPI:1} Chief Complaint  Patient presents with   Hematuria    Jaclyn Howe is a 37 y.o. female.  Patient complains of flank pain and blood in her urine.   Hematuria       Home Medications Prior to Admission medications   Medication Sig Start Date End Date Taking? Authorizing Provider  amLODipine (NORVASC) 5 MG tablet Take 1 tablet (5 mg total) by mouth daily. 05/02/23   Gilmore Laroche, FNP  ibuprofen (ADVIL) 800 MG tablet Take 1 tablet (800 mg total) by mouth every 8 (eight) hours as needed. 12/27/22   Vickki Hearing, MD  medroxyPROGESTERone Acetate 150 MG/ML SUSY Inject 1 ML IM once every 3 months in office 01/23/23   Adline Potter, NP  Vitamin D, Ergocalciferol, (DRISDOL) 1.25 MG (50000 UNIT) CAPS capsule Take 1 capsule (50,000 Units total) by mouth every 7 (seven) days. 05/05/23   Gilmore Laroche, FNP      Allergies    Patient has no known allergies.    Review of Systems   Review of Systems  Genitourinary:  Positive for hematuria.    Physical Exam Updated Vital Signs BP (!) 132/95 (BP Location: Right Arm)   Pulse 98   Temp 98.2 F (36.8 C) (Oral)   Resp 18   Ht 5\' 1"  (1.549 m)   Wt 45.4 kg   SpO2 99%   BMI 18.89 kg/m  Physical Exam  ED Results / Procedures / Treatments   Labs (all labs ordered are listed, but only abnormal results are displayed) Labs Reviewed  URINALYSIS, ROUTINE W REFLEX MICROSCOPIC - Abnormal; Notable for the following components:      Result Value   APPearance HAZY (*)    Hgb urine dipstick SMALL (*)    Protein, ur 30 (*)    Leukocytes,Ua TRACE (*)    Bacteria, UA RARE (*)    All other components within normal limits  PREGNANCY, URINE    EKG None  Radiology No results found.  Procedures Procedures  {Document  cardiac monitor, telemetry assessment procedure when appropriate:1}  Medications Ordered in ED Medications  ketorolac (TORADOL) 30 MG/ML injection 60 mg (has no administration in time range)    ED Course/ Medical Decision Making/ A&P   {Patient with flank pain and hematuria by history.  I recommended getting a CT scan and the patient agreed.  Although then she left AGAINST MEDICAL ADVICE Click here for ABCD2, HEART and other calculatorsREFRESH Note before signing :1}                              Medical Decision Making Amount and/or Complexity of Data Reviewed Labs: ordered.  Risk Prescription drug management.   Patient with history of hematuria.  She left AMA  {Document critical care time when appropriate:1} {Document review of labs and clinical decision tools ie heart score, Chads2Vasc2 etc:1}  {Document your independent review of radiology images, and any outside records:1} {Document your discussion with family members, caretakers, and with consultants:1} {Document social determinants of health affecting pt's care:1} {Document your decision making why or why not admission, treatments were needed:1} Final Clinical Impression(s) / ED Diagnoses Final diagnoses:  Gross hematuria    Rx / DC Orders ED Discharge Orders  None

## 2023-10-22 NOTE — ED Triage Notes (Signed)
Pt c/o hematuria that started last night, worsening this morning. Denies fever and dysuria. Reports right flank pain and abdominal pain.

## 2023-10-22 NOTE — ED Notes (Signed)
Pt not in room, pt not found in department, md notified

## 2023-10-23 DIAGNOSIS — R760 Raised antibody titer: Secondary | ICD-10-CM | POA: Diagnosis not present

## 2023-10-23 DIAGNOSIS — I10 Essential (primary) hypertension: Secondary | ICD-10-CM | POA: Diagnosis not present

## 2023-10-23 DIAGNOSIS — E559 Vitamin D deficiency, unspecified: Secondary | ICD-10-CM | POA: Diagnosis not present

## 2023-10-23 DIAGNOSIS — N181 Chronic kidney disease, stage 1: Secondary | ICD-10-CM | POA: Diagnosis not present

## 2023-12-04 ENCOUNTER — Ambulatory Visit: Payer: Medicaid Other

## 2023-12-05 ENCOUNTER — Ambulatory Visit (INDEPENDENT_AMBULATORY_CARE_PROVIDER_SITE_OTHER): Payer: Medicaid Other | Admitting: *Deleted

## 2023-12-05 DIAGNOSIS — Z3042 Encounter for surveillance of injectable contraceptive: Secondary | ICD-10-CM

## 2023-12-05 MED ORDER — MEDROXYPROGESTERONE ACETATE 150 MG/ML IM SUSY
150.0000 mg | PREFILLED_SYRINGE | Freq: Once | INTRAMUSCULAR | Status: AC
  Administered 2023-12-05: 150 mg via INTRAMUSCULAR

## 2023-12-05 NOTE — Progress Notes (Signed)
   NURSE VISIT- INJECTION  SUBJECTIVE:  Jaclyn Howe is a 37 y.o. G23P1001 female here for a Depo Provera for contraception/period management. She is a GYN patient.   OBJECTIVE:  There were no vitals taken for this visit.  Appears well, in no apparent distress  Injection administered in: Right deltoid  Meds ordered this encounter  Medications   medroxyPROGESTERone Acetate SUSY 150 mg    ASSESSMENT: GYN patient Depo Provera for contraception/period management PLAN: Follow-up: in 11-13 weeks for next Depo   Annamarie Dawley  12/05/2023 8:45 AM

## 2024-01-21 DIAGNOSIS — E559 Vitamin D deficiency, unspecified: Secondary | ICD-10-CM | POA: Diagnosis not present

## 2024-01-21 DIAGNOSIS — N181 Chronic kidney disease, stage 1: Secondary | ICD-10-CM | POA: Diagnosis not present

## 2024-01-21 DIAGNOSIS — I10 Essential (primary) hypertension: Secondary | ICD-10-CM | POA: Diagnosis not present

## 2024-01-21 DIAGNOSIS — R760 Raised antibody titer: Secondary | ICD-10-CM | POA: Diagnosis not present

## 2024-01-21 DIAGNOSIS — R319 Hematuria, unspecified: Secondary | ICD-10-CM | POA: Diagnosis not present

## 2024-01-31 ENCOUNTER — Ambulatory Visit: Payer: Medicaid Other | Admitting: Adult Health

## 2024-01-31 DIAGNOSIS — I10 Essential (primary) hypertension: Secondary | ICD-10-CM | POA: Diagnosis not present

## 2024-01-31 DIAGNOSIS — R760 Raised antibody titer: Secondary | ICD-10-CM | POA: Diagnosis not present

## 2024-01-31 DIAGNOSIS — E559 Vitamin D deficiency, unspecified: Secondary | ICD-10-CM | POA: Diagnosis not present

## 2024-01-31 DIAGNOSIS — R319 Hematuria, unspecified: Secondary | ICD-10-CM | POA: Diagnosis not present

## 2024-02-05 ENCOUNTER — Ambulatory Visit: Payer: Medicaid Other | Admitting: Adult Health

## 2024-02-24 ENCOUNTER — Telehealth: Payer: Self-pay

## 2024-02-24 ENCOUNTER — Other Ambulatory Visit: Payer: Self-pay | Admitting: Adult Health

## 2024-02-24 MED ORDER — MEDROXYPROGESTERONE ACETATE 150 MG/ML IM SUSY: PREFILLED_SYRINGE | INTRAMUSCULAR | 4 refills | Status: AC

## 2024-02-24 NOTE — Telephone Encounter (Signed)
Refilled depo 

## 2024-02-24 NOTE — Telephone Encounter (Signed)
 Patient needs a refill sent in for her Depo; she has an appointment 02/26/2024 for an annual and injection.

## 2024-02-26 ENCOUNTER — Encounter: Payer: Self-pay | Admitting: Adult Health

## 2024-02-26 ENCOUNTER — Ambulatory Visit (INDEPENDENT_AMBULATORY_CARE_PROVIDER_SITE_OTHER): Admitting: Adult Health

## 2024-02-26 VITALS — BP 127/85 | HR 84 | Ht 61.0 in | Wt 113.0 lb

## 2024-02-26 DIAGNOSIS — Z3042 Encounter for surveillance of injectable contraceptive: Secondary | ICD-10-CM

## 2024-02-26 DIAGNOSIS — Z1331 Encounter for screening for depression: Secondary | ICD-10-CM

## 2024-02-26 DIAGNOSIS — Z0000 Encounter for general adult medical examination without abnormal findings: Secondary | ICD-10-CM | POA: Diagnosis not present

## 2024-02-26 MED ORDER — MEDROXYPROGESTERONE ACETATE 150 MG/ML IM SUSY
150.0000 mg | PREFILLED_SYRINGE | Freq: Once | INTRAMUSCULAR | Status: AC
  Administered 2024-02-26: 150 mg via INTRAMUSCULAR

## 2024-02-26 NOTE — Progress Notes (Signed)
 Patient ID: Jaclyn Howe, female   DOB: 28-Aug-1986, 38 y.o.   MRN: [id number] History of Present Illness: Jaclyn Howe is a 38 year old black female,single, G1P1001, in for a well woman gyn exam and to get her depo.     Component Value Date/Time   DIAGPAP  01/23/2023 1128    - Negative for intraepithelial lesion or malignancy (NILM)   DIAGPAP  10/13/2019 1338    - Negative for intraepithelial lesion or malignancy (NILM)   HPVHIGH Negative 01/23/2023 1128   HPVHIGH Negative 10/13/2019 1338   ADEQPAP  01/23/2023 1128    Satisfactory for evaluation; transformation zone component PRESENT.   ADEQPAP  10/13/2019 1338    Satisfactory for evaluation; transformation zone component PRESENT.    PCP is Gilmore Laroche NP   Current Medications, Allergies, Past Medical History, Past Surgical History, Family History and Social History were reviewed in [employer] record.     Review of Systems: Patient denies any headaches, hearing loss, fatigue, blurred vision, shortness of breath, chest pain, abdominal pain, problems with bowel movements, urination, or intercourse(not active). No joint pain or mood swings.     Physical Exam:BP 127/85 (BP Location: Left Arm, Patient Position: Sitting, Cuff Size: Normal)   Pulse 84   Ht 5\' 1"  (1.549 m)   Wt 113 lb (51.3 kg)   BMI 21.35 kg/m   General:  Well developed, well nourished, no acute distress Skin:  Warm and dry Neck:  Midline trachea, normal thyroid, good ROM, no lymphadenopathy Lungs; Clear to auscultation bilaterally Breast:  No dominant palpable mass, retraction, or nipple discharge Cardiovascular: Regular rate and rhythm Abdomen:  Soft, non tender, no hepatosplenomegaly Pelvic:  External genitalia is normal in appearance, no lesions.  The vagina is normal in appearance. Urethra has no lesions or masses. The cervix is smooth.  Uterus is felt to be normal size, shape, and contour.  No adnexal masses or tenderness noted.Bladder  is non tender, no masses felt. Extremities/musculoskeletal:  No swelling or varicosities noted, no clubbing or cyanosis Psych:  No mood changes, alert and cooperative,seems happy AA is 0 Fall risk is low    02/26/2024    1:38 PM 05/02/2023    8:30 AM 02/01/2023    8:29 AM  Depression screen PHQ 2/9  Decreased Interest 1 0 3  Down, Depressed, Hopeless 0 0 0  PHQ - 2 Score 1 0 3  Altered sleeping 0 0 0  Tired, decreased energy 0 0 0  Change in appetite 0 0 0  Feeling bad or failure about yourself  0 0 0  Trouble concentrating 0 0 0  Moving slowly or fidgety/restless 0 0 0  Suicidal thoughts 0 0 0  PHQ-9 Score 1 0 3  Difficult doing work/chores  Not difficult at all Not difficult at all       02/26/2024    1:38 PM 05/02/2023    8:30 AM 01/23/2023   11:22 AM 10/31/2022    8:58 AM  GAD 7 : Generalized Anxiety Score  Nervous, Anxious, on Edge 0 0 0 0  Control/stop worrying 0 0 0 0  Worry too much - different things 0 0 0 0  Trouble relaxing 0 0 0 0  Restless 0 0 0 0  Easily annoyed or irritable 0 0 0 0  Afraid - awful might happen 0 0 0 0  Total GAD 7 Score 0 0 0 0  Anxiety Difficulty  Not difficult at all  Upstream - 02/26/24 1330       Pregnancy Intention Screening   Does the patient want to become pregnant in the next year? No    Does the patient's partner want to become pregnant in the next year? No    Would the patient like to discuss contraceptive options today? No      Contraception Wrap Up   Current Method Hormonal Injection    End Method Hormonal Injection    Contraception Counseling Provided Yes            Examination chaperoned by Malachy Mood LPn  Impression and plan: 1. Encounter for surveillance of injectable contraceptive She received depo in office today, has refills  Discussed risk of meningioma on depo was about 5% - medroxyPROGESTERone Acetate SUSY 150 mg  2. Routine general medical examination at a health care facility (Primary) Physical in 1  year  Pap in 2027 Labs with PCP

## 2024-02-27 ENCOUNTER — Ambulatory Visit: Payer: Medicaid Other

## 2024-03-10 ENCOUNTER — Other Ambulatory Visit: Payer: Self-pay | Admitting: Family Medicine

## 2024-03-10 DIAGNOSIS — I10 Essential (primary) hypertension: Secondary | ICD-10-CM

## 2024-03-10 NOTE — Telephone Encounter (Signed)
 Copied from CRM [id number]. Topic: Clinical - Medication Refill >> Mar 10, 2024 11:33 AM Marlow Baars wrote: Most Recent Primary Care Visit:  Provider: Gilmore Laroche  Department: [facility]  Visit Type: OFFICE VISIT  Date: 05/02/2023  Medication: amLODipine (NORVASC) 5 MG tablet  Has the patient contacted their pharmacy? No   Is this the correct pharmacy for this prescription? Yes If no, delete pharmacy and type the correct one.  This is the patient's preferred pharmacy:  [facility] - [place], [place] - [address] [place] [address] Phone: [phone] Fax: [phone]   Has the prescription been filled recently? No  Is the patient out of the medication? No but she only has a few left  Has the patient been seen for an appointment in the last year OR does the patient have an upcoming appointment? Yes  Can we respond through MyChart? No she would like a call or text  Please assist patient further

## 2024-03-11 MED ORDER — AMLODIPINE BESYLATE 5 MG PO TABS: 5.0000 mg | ORAL_TABLET | Freq: Every day | ORAL | 2 refills | Status: DC

## 2024-03-16 ENCOUNTER — Other Ambulatory Visit: Payer: Self-pay | Admitting: Family Medicine

## 2024-03-16 DIAGNOSIS — E559 Vitamin D deficiency, unspecified: Secondary | ICD-10-CM

## 2024-03-16 MED ORDER — VITAMIN D (ERGOCALCIFEROL) 1.25 MG (50000 UNIT) PO CAPS: 50000.0000 [IU] | ORAL_CAPSULE | ORAL | 1 refills | Status: DC

## 2024-03-23 ENCOUNTER — Ambulatory Visit: Payer: Self-pay

## 2024-03-23 DIAGNOSIS — B001 Herpesviral vesicular dermatitis: Secondary | ICD-10-CM | POA: Diagnosis not present

## 2024-03-23 DIAGNOSIS — Z6822 Body mass index (BMI) 22.0-22.9, adult: Secondary | ICD-10-CM | POA: Diagnosis not present

## 2024-03-23 DIAGNOSIS — H00029 Hordeolum internum unspecified eye, unspecified eyelid: Secondary | ICD-10-CM | POA: Diagnosis not present

## 2024-03-23 NOTE — Telephone Encounter (Signed)
 Patient called, left VM to return the call to the office to speak to NT.   Copied From CRM [id number]. Reason for Triage: Possible pink eye, very swollen   Best contact: [phone]

## 2024-03-23 NOTE — Telephone Encounter (Signed)
 Patient called, left VM to return the call to the office to speak to NT.

## 2024-03-23 NOTE — Telephone Encounter (Signed)
 Unable to reach patient after 3 attempts by E2C2 NT, routing to the provider for resolution per protocol.

## 2024-04-01 NOTE — Progress Notes (Unsigned)
   Established Patient Office Visit   Subjective  Patient ID: Jaclyn Howe, female    DOB: Jan 13, 1986  Age: 38 y.o. MRN: [id number]  No chief complaint on file.   She  has a past medical history of Allergic rhinitis (06/22/2016), Chronic abdominal pain, Chronic headache, Epigastric pain (07/07/2018), Hypertension, Migraine headache (07/13/2008), and Nausea and vomiting.  HPI  ROS    Objective:     There were no vitals taken for this visit. {Vitals History (Optional):23777}  Physical Exam   No results found for any visits on 04/02/24.  The ASCVD Risk score (Arnett DK, et al., 2019) failed to calculate for the following reasons:   The 2019 ASCVD risk score is only valid for ages 38 to 103    Assessment & Plan:  There are no diagnoses linked to this encounter.  No follow-ups on file.   Cruzita Lederer Newman Nip, FNP

## 2024-04-01 NOTE — Patient Instructions (Signed)

## 2024-04-02 ENCOUNTER — Encounter: Payer: Self-pay | Admitting: Family Medicine

## 2024-04-02 ENCOUNTER — Ambulatory Visit: Payer: Self-pay | Admitting: Family Medicine

## 2024-04-02 VITALS — BP 131/87 | HR 113 | Ht 61.0 in | Wt 114.0 lb

## 2024-04-02 DIAGNOSIS — K649 Unspecified hemorrhoids: Secondary | ICD-10-CM | POA: Insufficient documentation

## 2024-04-02 DIAGNOSIS — E559 Vitamin D deficiency, unspecified: Secondary | ICD-10-CM | POA: Diagnosis not present

## 2024-04-02 DIAGNOSIS — R1084 Generalized abdominal pain: Secondary | ICD-10-CM

## 2024-04-02 DIAGNOSIS — K640 First degree hemorrhoids: Secondary | ICD-10-CM

## 2024-04-02 MED ORDER — HYDROCORTISONE ACETATE 25 MG RE SUPP: 25.0000 mg | Freq: Two times a day (BID) | RECTAL | 0 refills | Status: AC

## 2024-04-02 MED ORDER — VITAMIN D (ERGOCALCIFEROL) 1.25 MG (50000 UNIT) PO CAPS: 50000.0000 [IU] | ORAL_CAPSULE | ORAL | 1 refills | Status: AC

## 2024-04-02 NOTE — Assessment & Plan Note (Signed)
 Abdominal US ordered Increase oral fluid intake. Bland diet as tolerated. Avoid fluids that have a lot sugar or caffeine, Avoid spicy or fatty food. Avoid alcohol. Can take OTC tylenol for pain. Follow-up in unable to keep food/fluid down x 24 hours, dizziness, fevers, worsening or persistent symptoms to present to ED or contact primary care provider. Patient verbalizes understanding regarding plan of care and all questions answered.

## 2024-04-02 NOTE — Assessment & Plan Note (Signed)
 Grade 1 Hemorrhoids Trial on hydrocortisonesuppository Advise on increased fiber intake, adequate hydration, sitz baths. If pain persistent or rectal bleeding continue follow up

## 2024-04-03 ENCOUNTER — Other Ambulatory Visit: Payer: Self-pay | Admitting: Family Medicine

## 2024-04-03 DIAGNOSIS — E559 Vitamin D deficiency, unspecified: Secondary | ICD-10-CM

## 2024-04-03 NOTE — Telephone Encounter (Signed)
 Copied from CRM [id number]. Topic: Clinical - Medication Refill >> Apr 03, 2024 10:07 AM Beacher May wrote: Most Recent Primary Care Visit:  Provider: [facility]  Department: [facility]  Visit Type: ACUTE  Date: 04/02/2024  Medication: Vitamin D, Ergocalciferol, (DRISDOL) 1.25 MG (50000 UNIT) CAPS capsule  Has the patient contacted their pharmacy? No (Agent: If no, request that the patient contact the pharmacy for the refill. If patient does not wish to contact the pharmacy document the reason why and proceed with request.) (Agent: If yes, when and what did the pharmacy advise?)  Is this the correct pharmacy for this prescription? Yes If no, delete pharmacy and type the correct one.  This is the patient's preferred pharmacy:  [facility] - [place], [place] - [address] [place] [address] Phone: [phone] Fax: [phone]   Has the prescription been filled recently? No  Is the patient out of the medication? Yes  Has the patient been seen for an appointment in the last year OR does the patient have an upcoming appointment? Yes  Can we respond through MyChart? No  Agent: Please be advised that Rx refills may take up to 3 business days. We ask that you follow-up with your pharmacy.

## 2024-04-27 ENCOUNTER — Encounter: Payer: Self-pay | Admitting: Family Medicine

## 2024-04-27 ENCOUNTER — Ambulatory Visit (HOSPITAL_COMMUNITY)
Admission: RE | Admit: 2024-04-27 | Discharge: 2024-04-27 | Disposition: A | Discharge status: Home / Self Care | Source: Ambulatory Visit | Attending: Family Medicine | Admitting: Family Medicine

## 2024-04-27 DIAGNOSIS — R109 Unspecified abdominal pain: Secondary | ICD-10-CM | POA: Diagnosis not present

## 2024-04-27 DIAGNOSIS — R1084 Generalized abdominal pain: Secondary | ICD-10-CM | POA: Diagnosis not present

## 2024-05-20 ENCOUNTER — Ambulatory Visit

## 2024-05-20 DIAGNOSIS — Z3042 Encounter for surveillance of injectable contraceptive: Secondary | ICD-10-CM

## 2024-05-20 MED ORDER — MEDROXYPROGESTERONE ACETATE 150 MG/ML IM SUSY
150.0000 mg | PREFILLED_SYRINGE | Freq: Once | INTRAMUSCULAR | Status: AC
  Administered 2024-05-20: 150 mg via INTRAMUSCULAR

## 2024-05-20 NOTE — Progress Notes (Signed)
   NURSE VISIT- INJECTION  SUBJECTIVE:  Jaclyn Howe is a 38 y.o. G84P1001 female here for a Depo Provera  for contraception/period management. She is a GYN patient.   OBJECTIVE:  There were no vitals taken for this visit.  Appears well, in no apparent distress  Injection administered in: Right deltoid  Meds ordered this encounter  Medications   medroxyPROGESTERone  Acetate SUSY 150 mg    ASSESSMENT: GYN patient Depo Provera  for contraception/period management PLAN: Follow-up: in 11-13 weeks for next Depo   Alyssa Jumper  05/20/2024 9:57 AM

## 2024-08-12 ENCOUNTER — Ambulatory Visit

## 2024-08-17 ENCOUNTER — Ambulatory Visit (INDEPENDENT_AMBULATORY_CARE_PROVIDER_SITE_OTHER): Admitting: *Deleted

## 2024-08-17 DIAGNOSIS — Z3042 Encounter for surveillance of injectable contraceptive: Secondary | ICD-10-CM | POA: Diagnosis not present

## 2024-08-17 MED ORDER — MEDROXYPROGESTERONE ACETATE 150 MG/ML IM SUSY
150.0000 mg | PREFILLED_SYRINGE | Freq: Once | INTRAMUSCULAR | Status: AC
  Administered 2024-08-17: 150 mg via INTRAMUSCULAR

## 2024-08-17 NOTE — Progress Notes (Signed)
   NURSE VISIT- INJECTION  SUBJECTIVE:  Jaclyn Howe is a 38 y.o. G26P1001 female here for a Depo Provera  for contraception/period management. She is a GYN patient.   OBJECTIVE:  There were no vitals taken for this visit.  Appears well, in no apparent distress  Injection administered in: Left deltoid  Meds ordered this encounter  Medications   medroxyPROGESTERone  Acetate SUSY 150 mg    ASSESSMENT: GYN patient Depo Provera  for contraception/period management PLAN: Follow-up: in 11-13 weeks for next Depo   Alan LITTIE Fischer  08/17/2024 9:27 AM

## 2024-11-09 ENCOUNTER — Ambulatory Visit

## 2024-11-09 ENCOUNTER — Ambulatory Visit (INDEPENDENT_AMBULATORY_CARE_PROVIDER_SITE_OTHER): Admitting: *Deleted

## 2024-11-09 DIAGNOSIS — Z3042 Encounter for surveillance of injectable contraceptive: Secondary | ICD-10-CM

## 2024-11-09 MED ORDER — MEDROXYPROGESTERONE ACETATE 150 MG/ML IM SUSY
150.0000 mg | PREFILLED_SYRINGE | Freq: Once | INTRAMUSCULAR | Status: AC
  Administered 2024-11-09: 150 mg via INTRAMUSCULAR

## 2024-11-09 NOTE — Progress Notes (Signed)
   NURSE VISIT- INJECTION  SUBJECTIVE:  Jaclyn Howe is a 38 y.o. G33P1001 female here for a Depo Provera  for contraception/period management. She is a GYN patient.   OBJECTIVE:  There were no vitals taken for this visit.  Appears well, in no apparent distress  Injection administered in: Right deltoid  Meds ordered this encounter  Medications   medroxyPROGESTERone  Acetate SUSY 150 mg    ASSESSMENT: GYN patient Depo Provera  for contraception/period management PLAN: Follow-up: in 11-13 weeks for next Depo   Alan LITTIE Fischer  11/09/2024 1:46 PM

## 2024-11-26 ENCOUNTER — Ambulatory Visit: Payer: Self-pay

## 2024-11-26 NOTE — Telephone Encounter (Signed)
 FYI Only or Action Required?: Action required by provider: request for appointment and clinical question for provider.  Patient was last seen in primary care on 04/02/2024 by Terry Wilhelmena Lloyd Hilario, FNP.  Called Nurse Triage reporting Headache.  Symptoms began several days ago.  Interventions attempted: Prescription medications: amlodipine .  Symptoms are: unchanged.  Triage Disposition: See Physician Within 24 Hours  Patient/caregiver understands and will follow disposition?: No, wishes to speak with PCP   Copied from CRM #[id number]. Topic: Clinical - Red Word Triage >> Nov 26, 2024 10:53 AM Jaclyn Howe wrote: Red Word that prompted transfer to Nurse Triage:  Over the last few days, she has been having real bad headaches. Pain level is a 10. She has been taking 2 tablet of amLODipine  (NORVASC ) 5 MG tablet. It helps a little but it makes her sleep. Reason for Disposition  [1] MODERATE headache (e.g., interferes with normal activities) AND [2] present > 24 hours AND [3] unexplained  (Exceptions: Pain medicines not tried, typical migraine, or headache part of viral illness.)  Answer Assessment - Initial Assessment Questions No available appt with pcp. Offered appt with alternative provider, patient declines due to transportation issue.  Patient requesting appt with pcp, patient reports walk to clinic.  1. LOCATION: Where does it hurt?      Denies HA at this time, thinks BP is high, unable to check BP, does not have BP monitor Took Amlodipine  1 tab  Patient reports took 2 tabs of Amlodipine  yesterday due to severe HA. 2. ONSET: When did the headache start? (e.g., minutes, hours, days)      Last week severe HA, 11/22/24 3. PATTERN: Does the pain come and go, or has it been constant since it started?     Comes and goes 4. SEVERITY: How bad is the pain? and What does it keep you from doing?  (e.g., Scale 1-10; mild, moderate, or severe)     0 5. RECURRENT SYMPTOM: Have you  ever had headaches before? If Yes, ask: When was the last time? and What happened that time?      yes 6. CAUSE: What do you think is causing the headache?     htn 7. MIGRAINE: Have you been diagnosed with migraine headaches? If Yes, ask: Is this headache similar?      no 8. HEAD INJURY: Has there been any recent injury to your head?      no 9. OTHER SYMPTOMS: Do you have any other symptoms? (e.g., fever, stiff neck, eye pain, sore throat, cold symptoms) Denies HA, blurred vision, weakness/ numbness, diff speaking/walking, fever chills, n/v  Protocols used: Headache-A-AH

## 2024-11-29 ENCOUNTER — Other Ambulatory Visit: Payer: Self-pay | Admitting: Family Medicine

## 2024-11-29 DIAGNOSIS — I10 Essential (primary) hypertension: Secondary | ICD-10-CM

## 2025-02-01 ENCOUNTER — Ambulatory Visit
